# Patient Record
Sex: Male | Born: 1950 | Race: White | Hispanic: No | Marital: Married | Smoking: Light tobacco smoker
Health system: Southern US, Community
[De-identification: ages and names within clinical notes are randomized; demographics above are authoritative.]

## PROBLEM LIST (undated history)

## (undated) DIAGNOSIS — M19072 Primary osteoarthritis, left ankle and foot: Secondary | ICD-10-CM

## (undated) DIAGNOSIS — D801 Nonfamilial hypogammaglobulinemia: Secondary | ICD-10-CM

## (undated) DIAGNOSIS — F329 Major depressive disorder, single episode, unspecified: Secondary | ICD-10-CM

## (undated) DIAGNOSIS — I10 Essential (primary) hypertension: Secondary | ICD-10-CM

## (undated) DIAGNOSIS — E785 Hyperlipidemia, unspecified: Secondary | ICD-10-CM

## (undated) DIAGNOSIS — F32A Depression, unspecified: Secondary | ICD-10-CM

## (undated) DIAGNOSIS — N138 Other obstructive and reflux uropathy: Secondary | ICD-10-CM

## (undated) DIAGNOSIS — N401 Enlarged prostate with lower urinary tract symptoms: Secondary | ICD-10-CM

## (undated) DIAGNOSIS — M48062 Spinal stenosis, lumbar region with neurogenic claudication: Secondary | ICD-10-CM

## (undated) DIAGNOSIS — E039 Hypothyroidism, unspecified: Secondary | ICD-10-CM

## (undated) DIAGNOSIS — K289 Gastrojejunal ulcer, unspecified as acute or chronic, without hemorrhage or perforation: Secondary | ICD-10-CM

## (undated) DIAGNOSIS — G4733 Obstructive sleep apnea (adult) (pediatric): Secondary | ICD-10-CM

## (undated) HISTORY — DX: Other obstructive and reflux uropathy: N40.1

## (undated) HISTORY — DX: Primary osteoarthritis, left ankle and foot: M19.072

## (undated) HISTORY — PX: HERNIA REPAIR: SHX51

## (undated) HISTORY — DX: Hypothyroidism, unspecified: E03.9

## (undated) HISTORY — PX: APPENDECTOMY: SHX54

## (undated) HISTORY — PX: FOOT SURGERY: SHX648

## (undated) HISTORY — PX: THYROIDECTOMY, PARTIAL: SHX18

## (undated) HISTORY — DX: Essential (primary) hypertension: I10

## (undated) HISTORY — PX: VASECTOMY: SHX75

## (undated) HISTORY — DX: Hyperlipidemia, unspecified: E78.5

## (undated) HISTORY — PX: BACK SURGERY: SHX140

## (undated) HISTORY — DX: Major depressive disorder, single episode, unspecified: F32.9

## (undated) HISTORY — DX: Depression, unspecified: F32.A

## (undated) HISTORY — DX: Spinal stenosis, lumbar region with neurogenic claudication: M48.062

## (undated) HISTORY — DX: Obstructive sleep apnea (adult) (pediatric): G47.33

## (undated) HISTORY — DX: Other obstructive and reflux uropathy: N13.8

## (undated) HISTORY — DX: Nonfamilial hypogammaglobulinemia: D80.1

---

## 2004-06-03 ENCOUNTER — Ambulatory Visit: Payer: Self-pay | Admitting: Surgery

## 2007-12-04 ENCOUNTER — Ambulatory Visit: Payer: Self-pay | Admitting: Family Medicine

## 2012-12-01 DIAGNOSIS — R39198 Other difficulties with micturition: Secondary | ICD-10-CM | POA: Insufficient documentation

## 2012-12-01 DIAGNOSIS — N138 Other obstructive and reflux uropathy: Secondary | ICD-10-CM | POA: Insufficient documentation

## 2012-12-01 DIAGNOSIS — R351 Nocturia: Secondary | ICD-10-CM | POA: Insufficient documentation

## 2013-10-21 ENCOUNTER — Ambulatory Visit: Payer: Self-pay | Admitting: Gastroenterology

## 2013-10-24 LAB — PATHOLOGY REPORT

## 2014-03-24 DIAGNOSIS — E785 Hyperlipidemia, unspecified: Secondary | ICD-10-CM | POA: Insufficient documentation

## 2014-03-24 DIAGNOSIS — E890 Postprocedural hypothyroidism: Secondary | ICD-10-CM | POA: Insufficient documentation

## 2014-03-24 DIAGNOSIS — I10 Essential (primary) hypertension: Secondary | ICD-10-CM | POA: Insufficient documentation

## 2015-10-11 DIAGNOSIS — M19072 Primary osteoarthritis, left ankle and foot: Secondary | ICD-10-CM | POA: Insufficient documentation

## 2015-10-11 DIAGNOSIS — F3340 Major depressive disorder, recurrent, in remission, unspecified: Secondary | ICD-10-CM | POA: Insufficient documentation

## 2016-02-17 DIAGNOSIS — R339 Retention of urine, unspecified: Secondary | ICD-10-CM | POA: Insufficient documentation

## 2016-09-24 DIAGNOSIS — M48062 Spinal stenosis, lumbar region with neurogenic claudication: Secondary | ICD-10-CM | POA: Insufficient documentation

## 2017-06-08 DIAGNOSIS — E669 Obesity, unspecified: Secondary | ICD-10-CM | POA: Insufficient documentation

## 2018-05-19 ENCOUNTER — Encounter: Payer: Self-pay | Admitting: Podiatry

## 2018-05-19 ENCOUNTER — Ambulatory Visit (INDEPENDENT_AMBULATORY_CARE_PROVIDER_SITE_OTHER): Payer: Medicare Other | Admitting: Podiatry

## 2018-05-19 VITALS — BP 137/63 | HR 68 | Resp 16

## 2018-05-19 DIAGNOSIS — F329 Major depressive disorder, single episode, unspecified: Secondary | ICD-10-CM | POA: Insufficient documentation

## 2018-05-19 DIAGNOSIS — L603 Nail dystrophy: Secondary | ICD-10-CM

## 2018-05-19 DIAGNOSIS — E785 Hyperlipidemia, unspecified: Secondary | ICD-10-CM | POA: Insufficient documentation

## 2018-05-19 DIAGNOSIS — F32A Depression, unspecified: Secondary | ICD-10-CM | POA: Insufficient documentation

## 2018-05-19 DIAGNOSIS — M542 Cervicalgia: Secondary | ICD-10-CM | POA: Insufficient documentation

## 2018-05-19 DIAGNOSIS — E039 Hypothyroidism, unspecified: Secondary | ICD-10-CM | POA: Insufficient documentation

## 2018-05-19 NOTE — Progress Notes (Signed)
  Subjective:  Patient ID: Donald Lee, male    DOB: 13-Jan-1951,  MRN: [id number] HPI Chief Complaint  Patient presents with  . Nail Problem    Toenails bilateral - thick and discolored x years, doesn't think he can take oral meds due to other meds he's on, questions re: laser, check hallux right - lateral border - tender  . New Patient (Initial Visit)    68 y.o. male presents with the above complaint.   ROS: He denies fever chills nausea vomiting muscle aches pains calf pain back pain chest pain shortness of breath.  No past medical history on file.   Current Outpatient Medications:  .  albuterol (VENTOLIN HFA) 108 (90 Base) MCG/ACT inhaler, Ventolin HFA 90 mcg/actuation aerosol inhaler  INL 2 INHALATIONS ITL Q 4 H PRF WHZ OR SOB, Disp: , Rfl:  .  alfuzosin (UROXATRAL) 10 MG 24 hr tablet, , Disp: , Rfl:  .  atorvastatin (LIPITOR) 20 MG tablet, , Disp: , Rfl:  .  finasteride (PROSCAR) 5 MG tablet, , Disp: , Rfl:  .  levothyroxine (SYNTHROID, LEVOTHROID) 125 MCG tablet, , Disp: , Rfl:  .  losartan-hydrochlorothiazide (HYZAAR) 100-12.5 MG tablet, , Disp: , Rfl:   No Known Allergies Review of Systems Objective:   Vitals:   05/19/18 1037  BP: 137/63  Pulse: 68  Resp: 16    General: Well developed, nourished, in no acute distress, alert and oriented x3   Dermatological: Skin is warm, dry and supple bilateral. Nails x 10 are well maintained; remaining integument appears unremarkable at this time. There are no open sores, no preulcerative lesions, no rash or signs of infection present.  Toenails are thick discolored with some subungual bleeding.  They are long and painful.  Skin does demonstrate a mild tinea pedis.  Vascular: Dorsalis Pedis artery and Posterior Tibial artery pedal pulses are 2/4 bilateral with immedate capillary fill time. Pedal hair growth present. No varicosities and no lower extremity edema present bilateral.   Neruologic: Grossly intact via light touch  bilateral. Vibratory intact via tuning fork bilateral. Protective threshold with Semmes Wienstein monofilament intact to all pedal sites bilateral. Patellar and Achilles deep tendon reflexes 2+ bilateral. No Babinski or clonus noted bilateral.   Musculoskeletal: No gross boney pedal deformities bilateral. No pain, crepitus, or limitation noted with foot and ankle range of motion bilateral. Muscular strength 5/5 in all groups tested bilateral.  Gait: Unassisted, Nonantalgic.    Radiographs:  None taken  Assessment & Plan:   Assessment: Onychomycosis and tinea pedis  Plan: Took samples of the skin and nail to be sent for pathologic evaluation.  This patient does not want to take oral medication unless absolutely necessary he is very interested in laser therapy.     Cayce Quezada T. [place], [place]

## 2018-06-28 ENCOUNTER — Other Ambulatory Visit: Payer: Self-pay

## 2018-06-28 ENCOUNTER — Encounter: Payer: Self-pay | Admitting: Podiatry

## 2018-06-28 ENCOUNTER — Ambulatory Visit (INDEPENDENT_AMBULATORY_CARE_PROVIDER_SITE_OTHER): Payer: Medicare Other | Admitting: Podiatry

## 2018-06-28 DIAGNOSIS — Z79899 Other long term (current) drug therapy: Secondary | ICD-10-CM | POA: Diagnosis not present

## 2018-06-28 DIAGNOSIS — L603 Nail dystrophy: Secondary | ICD-10-CM

## 2018-06-28 MED ORDER — TERBINAFINE HCL 250 MG PO TABS: 250.0000 mg | ORAL_TABLET | Freq: Every day | ORAL | 0 refills | Status: DC

## 2018-06-28 NOTE — Progress Notes (Signed)
He presents today for follow-up of his nail dystrophy just to discuss his pathology results.  Objective: Vital signs are stable alert oriented x3 pathology report today demonstrates positive for onychomycosis.  Assessment: Onychomycosis.  Plan: Discussed etiology pathology and surgical therapies at this point he would like to start on oral therapy.  I discussed laser therapy and topical therapy with him however he chose oral therapy we discussed the pros and cons of the use of the medication.  At this point we will go ahead and get him started on the Lamisil 250 mg tablets 1 p.o. daily and request liver profile.  Should blood work come back abnormal we will notify him immediately.

## 2018-06-29 LAB — HEPATIC FUNCTION PANEL
ALK PHOS: 51 IU/L (ref 39–117)
ALT: 27 IU/L (ref 0–44)
AST: 23 IU/L (ref 0–40)
Albumin: 5 g/dL — ABNORMAL HIGH (ref 3.8–4.8)
Bilirubin Total: 0.4 mg/dL (ref 0.0–1.2)
Bilirubin, Direct: 0.1 mg/dL (ref 0.00–0.40)
Total Protein: 6.9 g/dL (ref 6.0–8.5)

## 2018-07-02 ENCOUNTER — Telehealth: Payer: Self-pay | Admitting: *Deleted

## 2018-07-02 NOTE — Telephone Encounter (Signed)
Phone rang over 1 minute without answering service message.

## 2018-07-02 NOTE — Telephone Encounter (Signed)
-----   Message from Elinor Parkinson, [place] sent at 06/29/2018  7:08 AM EDT ----- Blood work looks good and may continue medication.

## 2018-07-05 NOTE — Telephone Encounter (Signed)
Left message informing pt of Dr. Hyatt's review of results and orders. 

## 2018-07-22 ENCOUNTER — Other Ambulatory Visit: Payer: Self-pay | Admitting: Podiatry

## 2018-08-03 ENCOUNTER — Telehealth: Payer: Self-pay | Admitting: *Deleted

## 2018-08-03 NOTE — Telephone Encounter (Signed)
Refill request for terbinafine.

## 2018-08-03 NOTE — Telephone Encounter (Signed)
Patient is seeing Dr. Al Corpus tomorrow for Lamisil follow up.  We will wait to refill until after seen.

## 2018-08-04 ENCOUNTER — Ambulatory Visit (INDEPENDENT_AMBULATORY_CARE_PROVIDER_SITE_OTHER): Payer: Medicare Other | Admitting: Podiatry

## 2018-08-04 ENCOUNTER — Ambulatory Visit: Payer: Medicare Other | Admitting: Podiatry

## 2018-08-04 ENCOUNTER — Encounter: Payer: Self-pay | Admitting: Podiatry

## 2018-08-04 ENCOUNTER — Other Ambulatory Visit: Payer: Self-pay

## 2018-08-04 VITALS — Temp 97.4°F

## 2018-08-04 DIAGNOSIS — Z79899 Other long term (current) drug therapy: Secondary | ICD-10-CM | POA: Diagnosis not present

## 2018-08-04 DIAGNOSIS — L603 Nail dystrophy: Secondary | ICD-10-CM | POA: Diagnosis not present

## 2018-08-04 MED ORDER — TERBINAFINE HCL 250 MG PO TABS: 250.0000 mg | ORAL_TABLET | Freq: Every day | ORAL | 0 refills | Status: DC

## 2018-08-04 NOTE — Progress Notes (Signed)
He presents today for follow-up of his treatment for nail fungus.  He continues to take his Lamisil on a daily basis has no problem taking it.  Denies fever chills nausea vomiting muscle aches pains.  Objective: No change in physical exam.  Assessment: Onychomycosis.  Plan: Continue therapy Lamisil 250 mg tablets 1 p.o. daily we also requested a liver profile I will follow-up with him should this liver profile be abnormal otherwise I will see him in 4 months

## 2018-08-06 LAB — HEPATIC FUNCTION PANEL
ALT: 22 IU/L (ref 0–44)
AST: 21 IU/L (ref 0–40)
Albumin: 4.5 g/dL (ref 3.8–4.8)
Alkaline Phosphatase: 47 IU/L (ref 39–117)
Bilirubin Total: 0.3 mg/dL (ref 0.0–1.2)
Bilirubin, Direct: 0.07 mg/dL (ref 0.00–0.40)
Total Protein: 6.7 g/dL (ref 6.0–8.5)

## 2018-10-12 ENCOUNTER — Other Ambulatory Visit: Payer: Self-pay | Admitting: Podiatry

## 2018-11-24 ENCOUNTER — Ambulatory Visit: Payer: Medicare Other | Admitting: Podiatry

## 2018-11-24 ENCOUNTER — Ambulatory Visit (INDEPENDENT_AMBULATORY_CARE_PROVIDER_SITE_OTHER): Payer: Medicare Other | Admitting: Podiatry

## 2018-11-24 ENCOUNTER — Other Ambulatory Visit: Payer: Self-pay

## 2018-11-24 ENCOUNTER — Encounter: Payer: Self-pay | Admitting: Podiatry

## 2018-11-24 VITALS — Temp 98.2°F

## 2018-11-24 DIAGNOSIS — L603 Nail dystrophy: Secondary | ICD-10-CM | POA: Diagnosis not present

## 2018-11-24 MED ORDER — TERBINAFINE HCL 250 MG PO TABS: 250.0000 mg | ORAL_TABLET | Freq: Every day | ORAL | 0 refills | Status: DC

## 2018-11-24 NOTE — Progress Notes (Signed)
He presents today for follow-up of his Lamisil therapy.  States that he is doing very well he is very happy with the outcome of the therapy thus far.  States that it is more to grow out.  States that he will be getting married on the 20th and he is glad that his toenails look better.  Objective: Vital signs are stable he is alert and oriented x3.  Pulses are palpable.  There is no erythema edema cellulitis drainage odor toenails are growing out by approximately 50%.  Assessment: Resolving onychomycosis with long-term therapy of Lamisil.  Plan: He will take 1 tablet every other day for the next 2 months I will follow-up with him in 3 months.

## 2019-03-02 ENCOUNTER — Other Ambulatory Visit: Payer: Self-pay

## 2019-03-02 ENCOUNTER — Encounter: Payer: Self-pay | Admitting: Podiatry

## 2019-03-02 ENCOUNTER — Ambulatory Visit (INDEPENDENT_AMBULATORY_CARE_PROVIDER_SITE_OTHER): Payer: Medicare Other | Admitting: Podiatry

## 2019-03-02 DIAGNOSIS — L600 Ingrowing nail: Secondary | ICD-10-CM | POA: Diagnosis not present

## 2019-03-02 DIAGNOSIS — L603 Nail dystrophy: Secondary | ICD-10-CM

## 2019-03-02 MED ORDER — TERBINAFINE HCL 250 MG PO TABS: 250.0000 mg | ORAL_TABLET | Freq: Every day | ORAL | 0 refills | Status: DC

## 2019-03-02 MED ORDER — NEOMYCIN-POLYMYXIN-HC 1 % OT SOLN: OTIC | 1 refills | Status: DC

## 2019-03-02 NOTE — Patient Instructions (Signed)

## 2019-03-02 NOTE — Progress Notes (Signed)
And presents today for follow-up of his onychomycosis.  He states that is growing out very nicely and very happy with the outcome.  I think I need a few more meds and then it should be done.  However I have an ingrown today which is really bother me as he refers to the hallux right with both borders.  Been bothering for quite some time I can get rid of it.  Objective: Vital signs are stable alert oriented x3.  There is no erythema edema cellulitis drainage or odor to all of the toes with exception of the hallux right with a sharp incurvated nail margin pain on palpation.  No purulence no malodor is noted.  Nail plates appear to be 65% resolved.  Assessment: Well-healing onychomycosis.  Ingrown toenail hallux right.  Plan: Continue the use of every other day Lamisil 250 mg tablets and I will follow-up with him in 3 months for this.  Questions or concerns she will notify us.  Otherwise chemical matrixectomy was performed today after local anesthetic was provided.  He tolerated the procedure well with no complications.  He was provided both oral and written home-going instruction for the care and soaking of the toe as well as a prescription for Corticosporin otic to be applied twice daily after soaking.  Follow-up with him in 2 weeks for this reevaluation.

## 2019-03-16 ENCOUNTER — Ambulatory Visit (INDEPENDENT_AMBULATORY_CARE_PROVIDER_SITE_OTHER): Payer: Self-pay | Admitting: Podiatry

## 2019-03-16 ENCOUNTER — Other Ambulatory Visit: Payer: Self-pay

## 2019-03-16 DIAGNOSIS — L600 Ingrowing nail: Secondary | ICD-10-CM

## 2019-03-16 NOTE — Progress Notes (Signed)
He presents today for follow-up of his matrixectomy hallux right.  States that he does have some minor pain and redness but it seems to be getting much better.  He did not hit the Corticosporin otic field because of the expense.  But he continues to take his Lamisil.  Objective: Vital signs are stable he is alert and oriented x3 there is no erythema edema cellulitis drainage odor appears to be healing very nicely.  Tip of the toe is mildly tender.  If this continues to be is tender we may need to do reassess it and make sure that there is nothing there such as a foreign body or deep abscess.  Assessment: Well-healing surgical foot possible abscess.  Plan: Continue to soak Epson salts and warm water follow-up with me in 2 weeks if not improved.

## 2019-04-04 ENCOUNTER — Ambulatory Visit: Payer: Medicare Other | Admitting: Podiatry

## 2019-05-03 ENCOUNTER — Other Ambulatory Visit: Payer: Self-pay | Admitting: Podiatry

## 2019-05-11 DIAGNOSIS — G2581 Restless legs syndrome: Secondary | ICD-10-CM | POA: Insufficient documentation

## 2019-06-01 ENCOUNTER — Ambulatory Visit: Payer: Medicare Other | Admitting: Podiatry

## 2019-07-12 ENCOUNTER — Other Ambulatory Visit: Payer: Self-pay | Admitting: Physician Assistant

## 2019-07-12 DIAGNOSIS — M25519 Pain in unspecified shoulder: Secondary | ICD-10-CM

## 2019-07-12 DIAGNOSIS — M5412 Radiculopathy, cervical region: Secondary | ICD-10-CM

## 2019-07-12 DIAGNOSIS — M542 Cervicalgia: Secondary | ICD-10-CM

## 2019-07-28 ENCOUNTER — Other Ambulatory Visit: Payer: Self-pay

## 2019-07-28 ENCOUNTER — Ambulatory Visit
Admission: RE | Admit: 2019-07-28 | Discharge: 2019-07-28 | Disposition: A | Discharge status: Home / Self Care | Payer: Medicare Other | Source: Ambulatory Visit | Attending: Physician Assistant | Admitting: Physician Assistant

## 2019-07-28 DIAGNOSIS — M542 Cervicalgia: Secondary | ICD-10-CM | POA: Insufficient documentation

## 2019-07-28 DIAGNOSIS — M25519 Pain in unspecified shoulder: Secondary | ICD-10-CM | POA: Diagnosis present

## 2019-07-28 DIAGNOSIS — M5412 Radiculopathy, cervical region: Secondary | ICD-10-CM | POA: Insufficient documentation

## 2019-07-28 IMAGING — MR MR CERVICAL SPINE W/O CM
5 series · 38 of 48 positions shown · non-contrast
Comparison: None.

CLINICAL DATA: Radiculitis of right cervical region. Neck and
shoulder pain.

EXAM:
MRI CERVICAL SPINE WITHOUT CONTRAST
TECHNIQUE: Multiplanar, multisequence MR imaging of the cervical spine was
performed. No intravenous contrast was administered.

[Series 5: T2 · sagittal · 3.0mm · 0.62mm/px · 6 of 15 slices shown (1 of 2)]
[im 1/15]
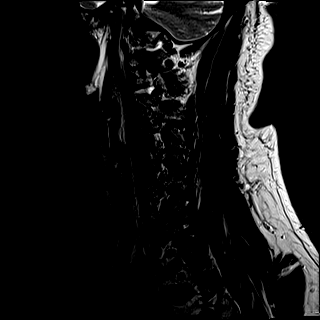
[im 3/15]
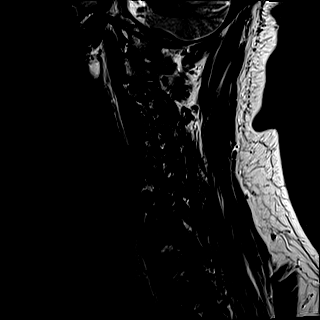
[im 6/15]
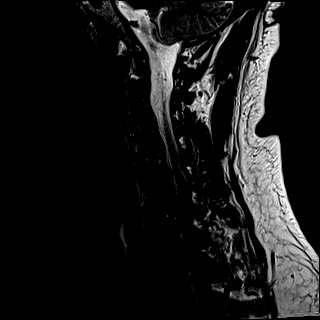
[im 9/15]
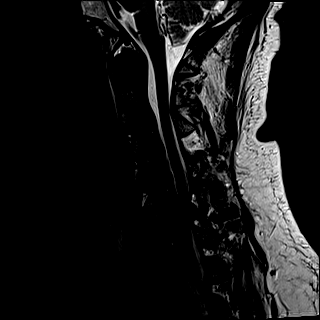
[im 12/15]
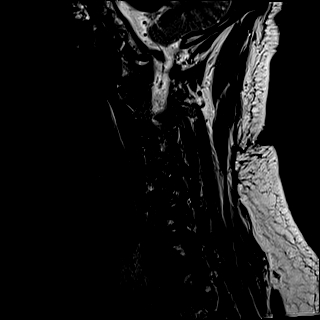
[im 15/15]
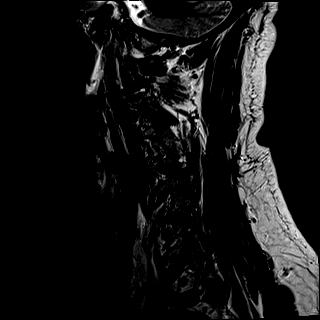

[Series 6: FLAIR · sagittal · 3.0mm · 0.78mm/px · 7 of 15 slices shown]
[im 1/15]
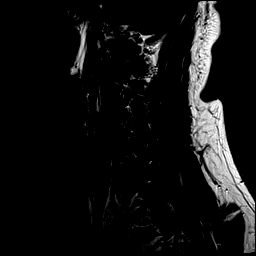
[im 3/15]
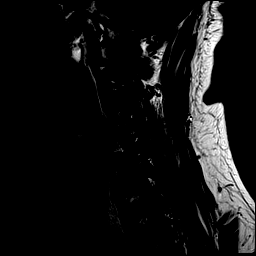
[im 5/15]
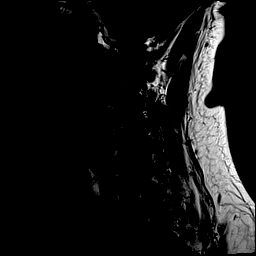
[im 8/15]
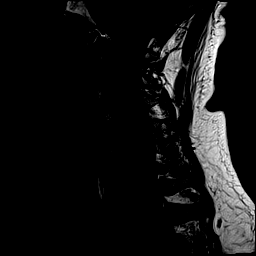
[im 10/15]
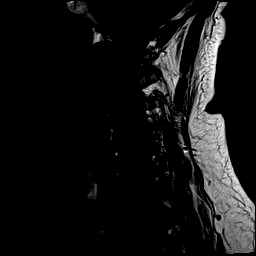
[im 12/15]
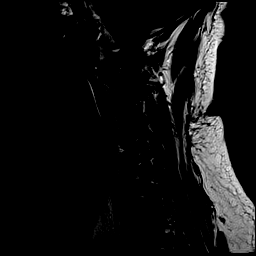
[im 15/15]
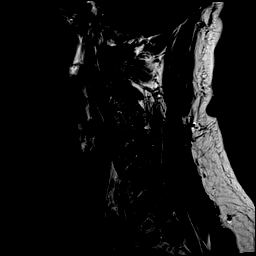

[Series 7: STIR · sagittal · 3.0mm · 0.62mm/px · 7 of 15 slices shown]
[im 1/15]
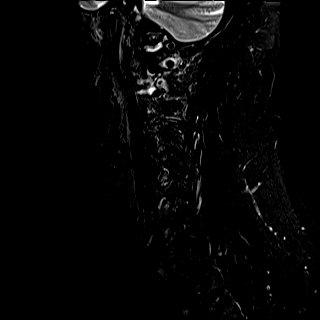
[im 3/15]
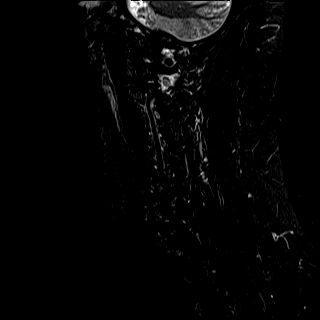
[im 5/15]
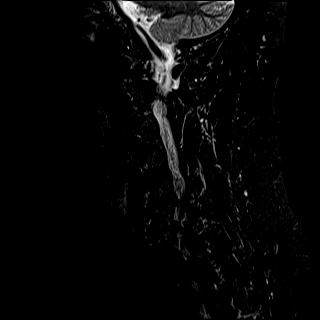
[im 8/15]
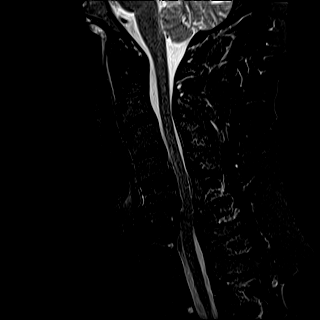
[im 10/15]
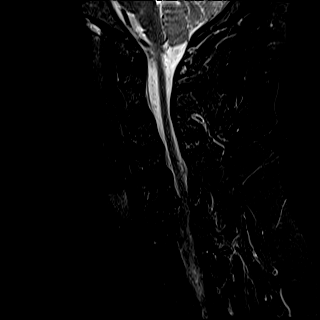
[im 12/15]
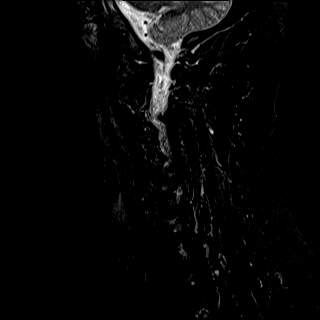
[im 15/15]
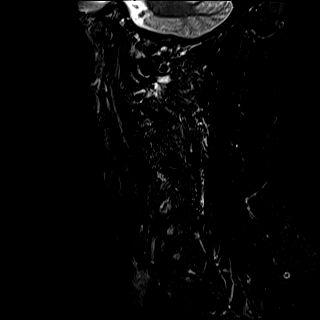

[Series 8: T2 · axial · 3.0mm · 0.70mm/px · z∈[-136,-32]mm · 10 of 31 slices shown (2 of 2)]
[im 1/31]
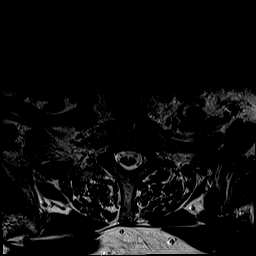
[im 3/31]
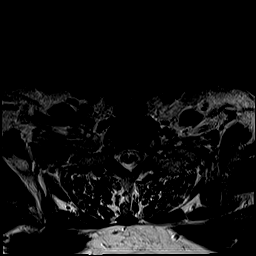
[im 5/31]
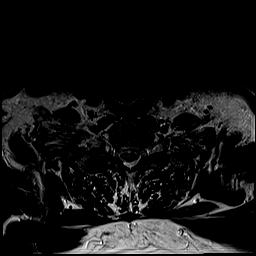
[im 7/31]
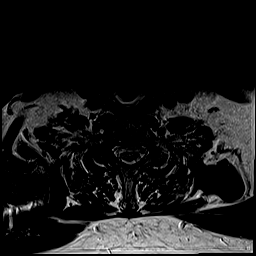
[im 10/31]
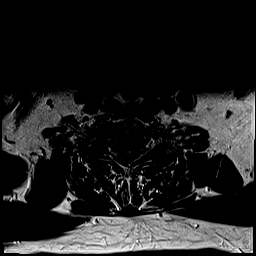
[im 14/31]
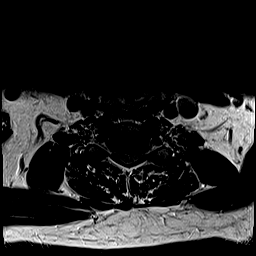
[im 17/31]
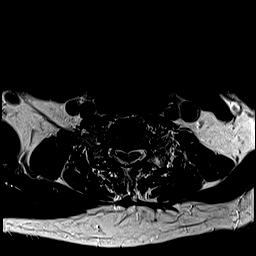
[im 21/31]
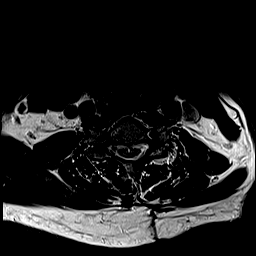
[im 26/31]
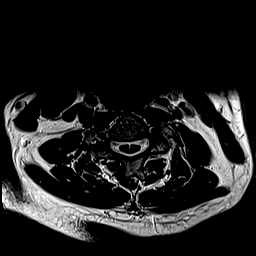
[im 31/31]
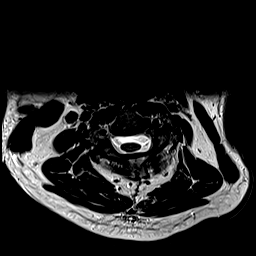

[Series 9: ax mpgr · axial · 3.0mm · 0.35mm/px · z∈[-136,-32]mm · 8 of 31 slices shown]
[im 1/31]
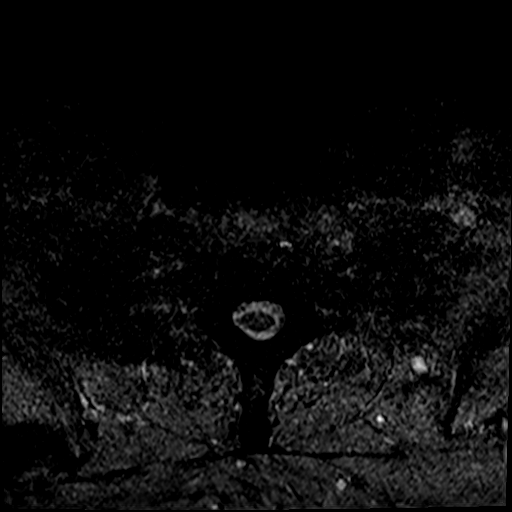
[im 5/31]
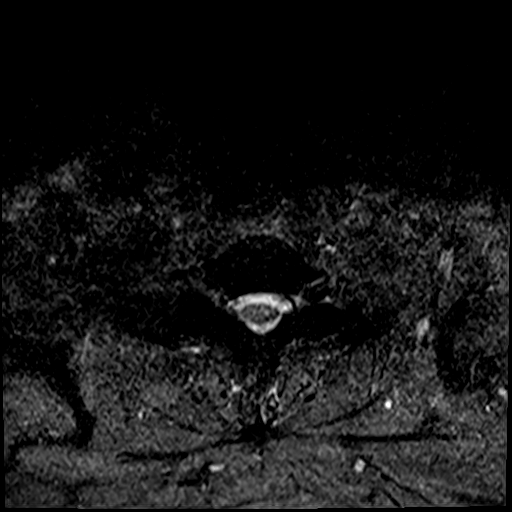
[im 10/31]
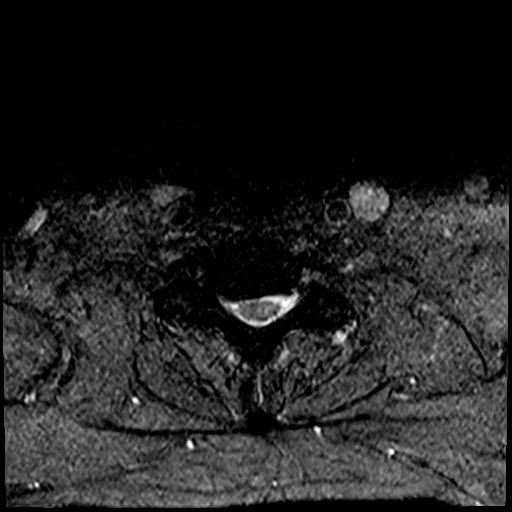
[im 14/31]
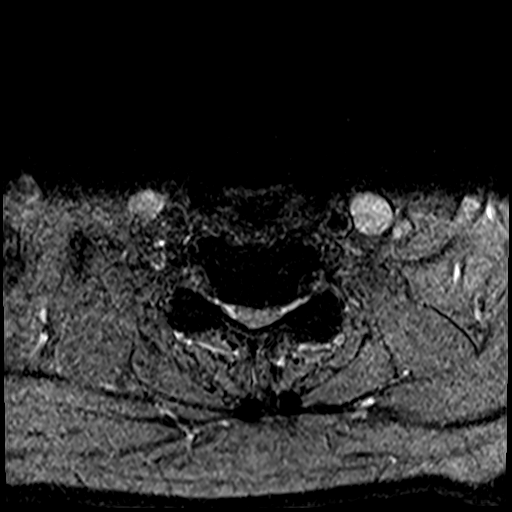
[im 17/31]
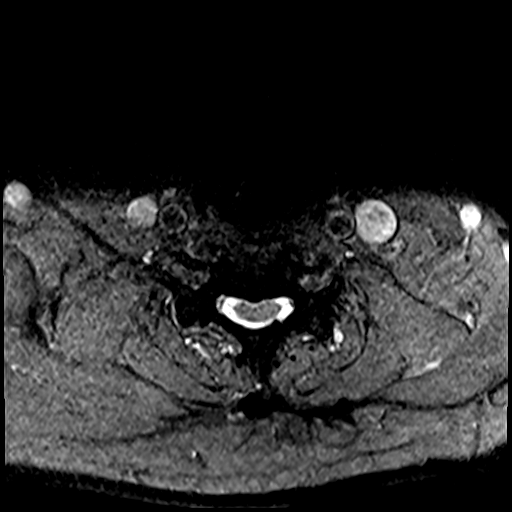
[im 21/31]
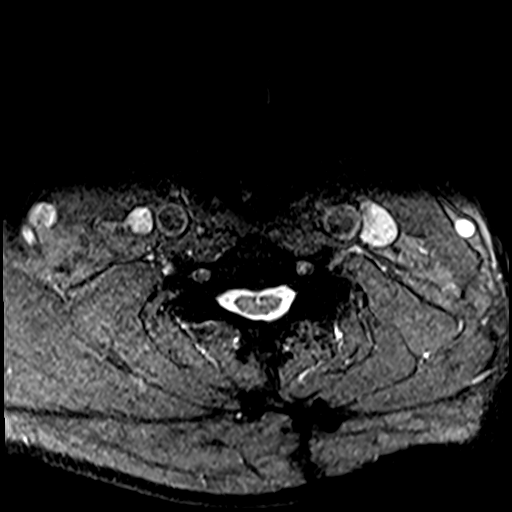
[im 26/31]
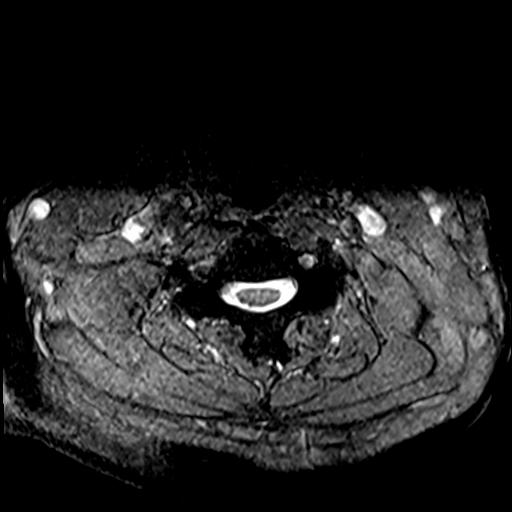
[im 31/31]
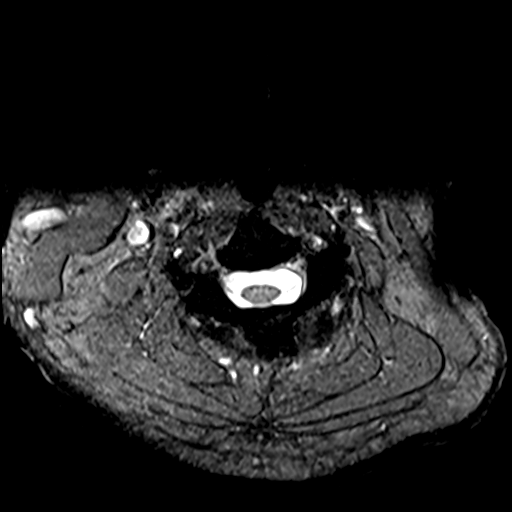

[38 of 48 positions shown; findings below may reference images not displayed]

FINDINGS: Alignment: Mild reversal of the cervical curvature.

Vertebrae: No fracture, evidence of discitis, or bone lesion.
Endplate degenerative changes at C5-6.

Cord: Flattening of the anterior aspect of the cord at C5-6, without
cord signal abnormality.

Posterior Fossa, vertebral arteries, paraspinal tissues: Negative.

Disc levels:

C2-3: No spinal canal stenosis. Facet degenerative changes, more
pronounced on the left side resulting in mild left neural foraminal
stenosis.

C3-4: No spinal canal stenosis. Facet degenerative changes, more
pronounced on the left side, resulting in mild left neural foraminal
narrowing.

C4-5: Posterior disc protrusion resulting in mild spinal canal
stenosis. Uncovertebral and facet degenerative changes, more
pronounced at the level of the left facet joint resulting in mild
right and severe left neural foraminal narrowing.

C5-6: Posterior disc osteophyte complex causing flattening of the
anterior cord and moderate spinal canal stenosis. Uncovertebral and
facet degenerative changes resulting in severe bilateral neural
foraminal narrowing.

C6-7: Posterior disc protrusion resulting in mild spinal canal
stenosis. Uncovertebral and facet degenerative changes, more
pronounced at the level of the right uncovertebral joint, resulting
in severe right and mild left neural foraminal narrowing.

C7-T1: Facet degenerative changes. No spinal canal or neural
foraminal stenosis.
IMPRESSION: 1. Multilevel degenerative changes of the cervical spine, as
described above, worst at C5-6 where there is moderate spinal canal
stenosis with flattening of the anterior aspect of the cord. No
abnormal cord signal.
2. Multilevel neural foraminal stenosis, severe on the left at C4-5,
bilaterally at C5-6 and on the right at C6-7.

## 2019-08-15 DIAGNOSIS — M4802 Spinal stenosis, cervical region: Secondary | ICD-10-CM | POA: Insufficient documentation

## 2020-01-05 ENCOUNTER — Other Ambulatory Visit: Payer: Self-pay | Admitting: Physical Medicine & Rehabilitation

## 2020-01-05 DIAGNOSIS — M5416 Radiculopathy, lumbar region: Secondary | ICD-10-CM

## 2020-01-05 DIAGNOSIS — G8929 Other chronic pain: Secondary | ICD-10-CM | POA: Insufficient documentation

## 2020-01-23 ENCOUNTER — Ambulatory Visit
Admission: RE | Admit: 2020-01-23 | Discharge: 2020-01-23 | Disposition: A | Discharge status: Home / Self Care | Payer: Medicare Other | Source: Ambulatory Visit | Attending: Physical Medicine & Rehabilitation | Admitting: Physical Medicine & Rehabilitation

## 2020-01-23 ENCOUNTER — Other Ambulatory Visit: Payer: Self-pay

## 2020-01-23 DIAGNOSIS — M5416 Radiculopathy, lumbar region: Secondary | ICD-10-CM | POA: Diagnosis present

## 2020-01-23 IMAGING — MR MR LUMBAR SPINE WO/W CM
6 of 7 series · 30 of 48 positions shown · IV contrast (10ml Gadavist)
Comparison: None.

CLINICAL DATA: Lumbar radiculopathy. Back pain and right leg pain.
Lumbar surgery 4 years ago.

EXAM:
MRI LUMBAR SPINE WITHOUT AND WITH CONTRAST
TECHNIQUE: Multiplanar and multiecho pulse sequences of the lumbar spine were
obtained without and with intravenous contrast.
CONTRAST:  10mL GADAVIST GADOBUTROL 1 MMOL/ML IV SOLN

[Series 5: T2 · sagittal · 4.0mm · 0.88mm/px · 4 of 17 slices shown (1 of 2)]
[im 1/17]
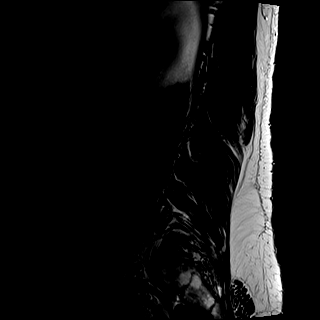
[im 6/17]
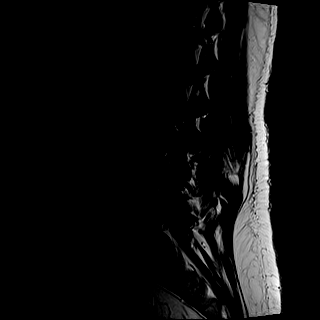
[im 11/17]
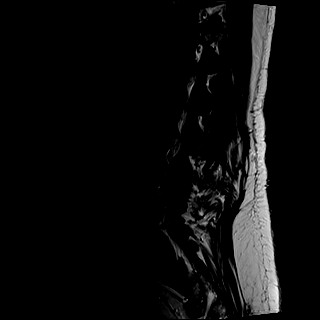
[im 17/17]
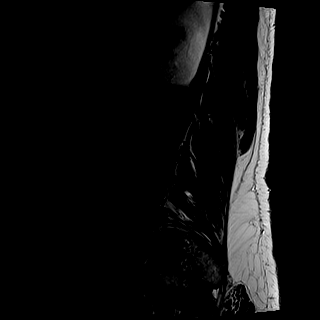

[Series 6: T1 · sagittal · 4.0mm · 0.88mm/px · 4 of 17 slices shown (1 of 2)]
[im 1/17]
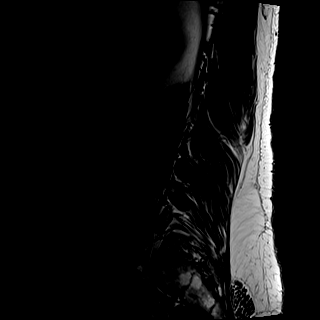
[im 6/17]
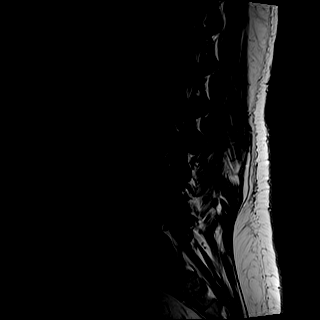
[im 11/17]
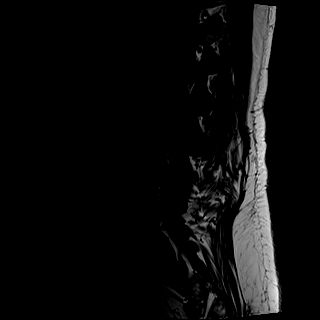
[im 17/17]
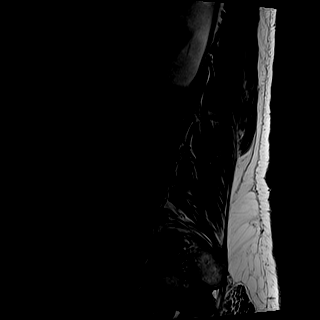

[Series 7: STIR · sagittal · 4.0mm · 0.44mm/px · 1 of 17 slices shown]
[im 1/17]
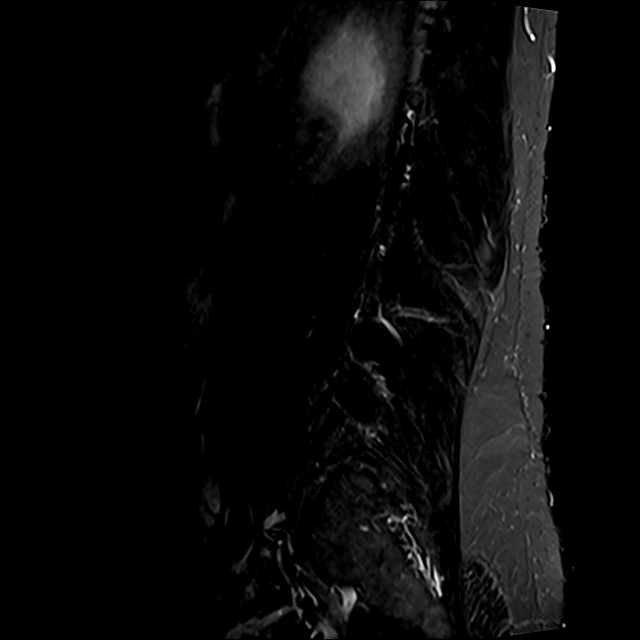

[Series 8: T2 · axial · 4.0mm · 0.78mm/px · z∈[-116,+107]mm · 8 of 36 slices shown (2 of 2)]
[im 1/36]
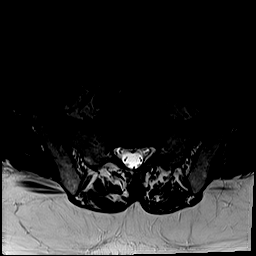
[im 4/36]
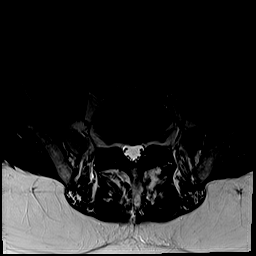
[im 12/36]
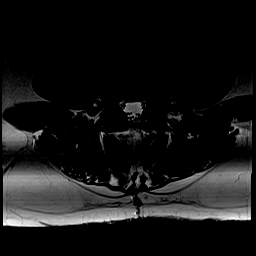
[im 16/36]
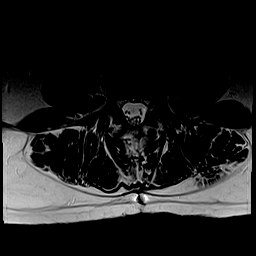
[im 20/36]
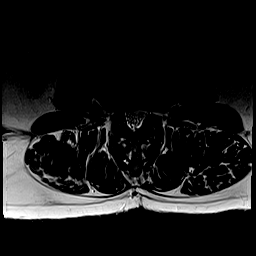
[im 24/36]
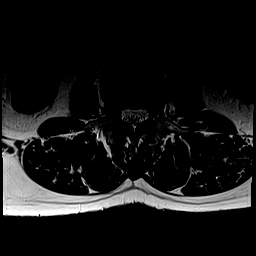
[im 32/36]
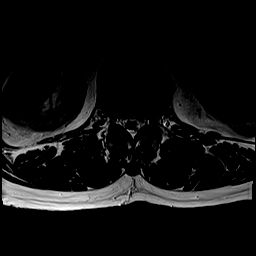
[im 36/36]
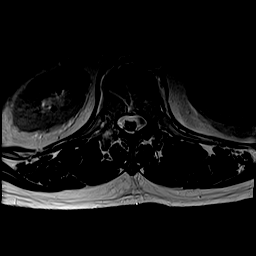

[Series 9: T1 · axial · 4.0mm · 0.39mm/px · z∈[-116,+107]mm · 8 of 36 slices shown (2 of 2)]
[im 1/36]
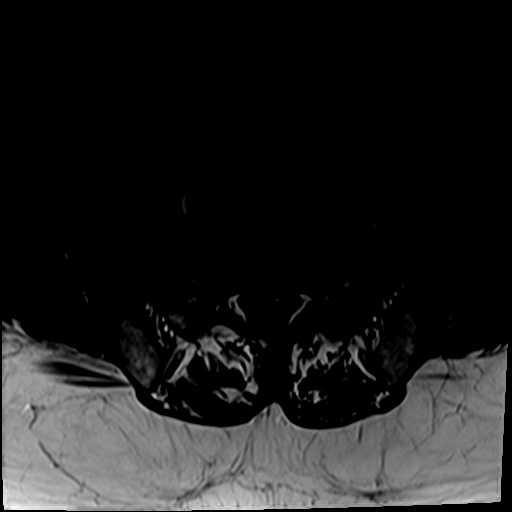
[im 4/36]
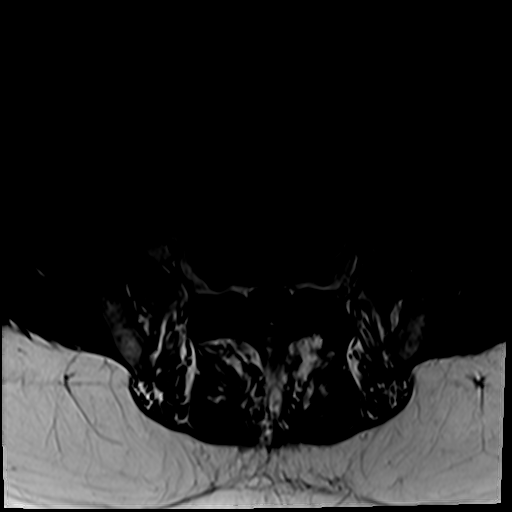
[im 12/36]
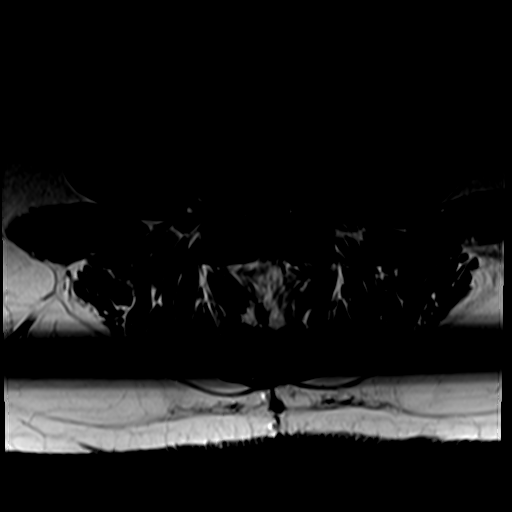
[im 16/36]
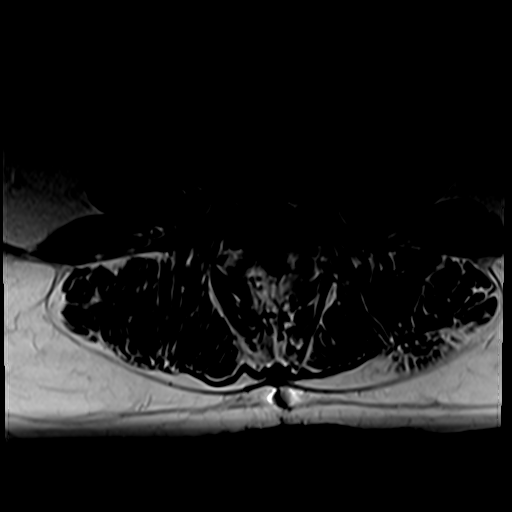
[im 20/36]
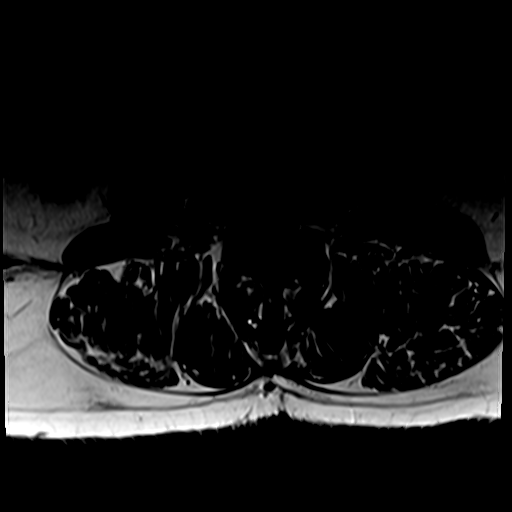
[im 24/36]
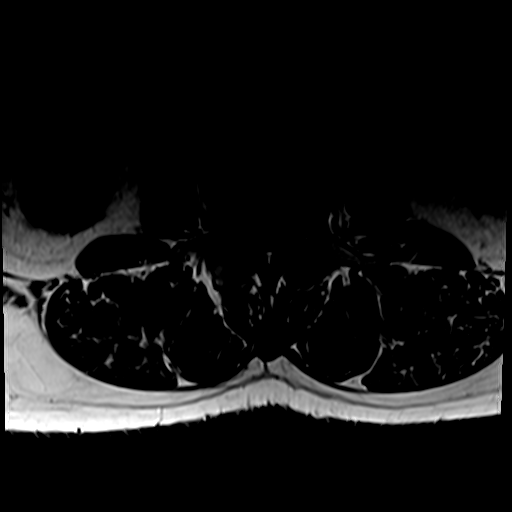
[im 32/36]
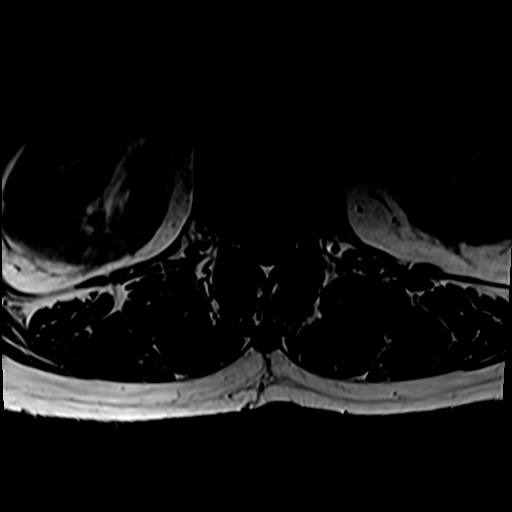
[im 36/36]
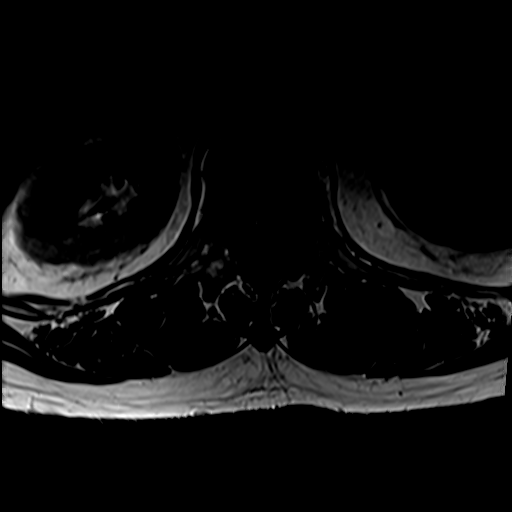

[Series 10: T1 fat-sat post-contrast · sagittal · 4.0mm · 0.88mm/px · 5 of 17 slices shown]
[im 1/17]
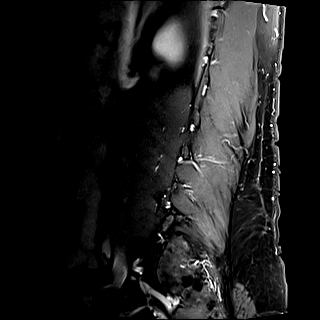
[im 5/17]
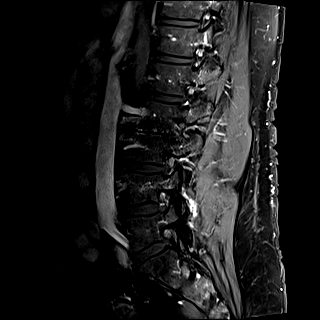
[im 9/17]
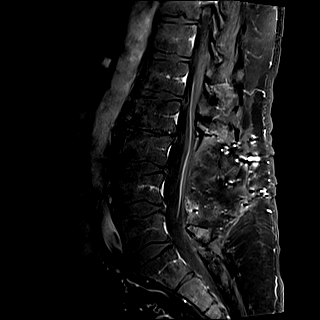
[im 13/17]
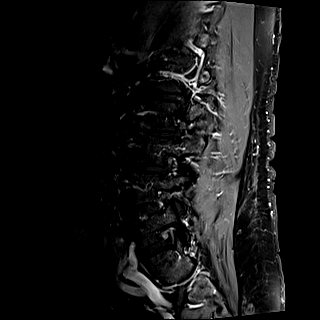
[im 17/17]
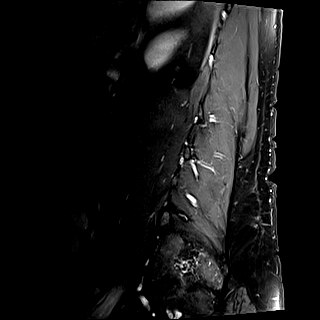

[30 of 48 positions shown; findings below may reference images not displayed]

FINDINGS: Segmentation:  Normal

Alignment:  Slight retrolisthesis L2-3.  Remaining alignment normal.

Vertebrae:  Normal bone marrow.  Negative for fracture or mass.

Conus medullaris and cauda equina: Conus extends to the L1-2 level.
Diffuse enhancement of the left S1 nerve root beginning at the conus
medullaris and extending to the neural foramen. No nodularity or
mass identified. Remaining nerve roots do not show abnormal
enhancement.

Paraspinal and other soft tissues: Negative for paraspinous mass or
adenopathy. No fluid collection in the soft tissues.

Disc levels:

L1-2: Mild disc degeneration and disc bulging. Mild facet
degeneration. Mild spinal stenosis. Shallow left foraminal disc
protrusion with mild left foraminal narrowing.

L2-3: Advanced disc degeneration with disc space narrowing and mild
bone marrow edema. Diffuse endplate spurring. Moderate subarticular
stenosis bilaterally. Laminectomy without spinal canal stenosis

L3-4: Diffuse disc bulging and small central disc protrusion. Mild
facet degeneration. Moderate subarticular stenosis bilaterally.
Posterior decompression. Spinal canal adequately decompressed.

L4-5: Disc degeneration with disc bulging and mild endplate spurring
and mild facet degeneration. Moderate subarticular and foraminal
stenosis on the right. Mild subarticular and foraminal stenosis on
the left. Posterior decompression. Spinal canal decompressed.

L5-S1: Normal disc space. Bilateral facet degeneration. No
significant spinal or foraminal stenosis.
IMPRESSION: Mild spinal stenosis L1-2. Shallow left foraminal disc protrusion
with mild left foraminal narrowing

Moderate subarticular stenosis bilaterally L2-3. Posterior
decompression

Moderate subarticular stenosis bilaterally L3-4. Posterior
decompression

Moderate subarticular foraminal stenosis on the right at L4-5 with
posterior decompression.

Diffuse enhancement of the left S1 nerve root without nodularity or
mass. This is most likely due to neuritis.

## 2020-01-23 MED ORDER — GADOBUTROL 1 MMOL/ML IV SOLN
10.0000 mL | Freq: Once | INTRAVENOUS | Status: AC | PRN
  Administered 2020-01-23: 10 mL via INTRAVENOUS

## 2020-04-16 ENCOUNTER — Ambulatory Visit: Payer: Medicare Other

## 2020-04-16 NOTE — Progress Notes (Unsigned)
Patient did not show up for visit today, office staff to contact patient and reschedule. 

## 2020-04-17 ENCOUNTER — Ambulatory Visit (INDEPENDENT_AMBULATORY_CARE_PROVIDER_SITE_OTHER): Payer: Medicare Other | Admitting: Internal Medicine

## 2020-04-17 VITALS — Ht 71.0 in | Wt 240.0 lb

## 2020-04-17 DIAGNOSIS — Z9989 Dependence on other enabling machines and devices: Secondary | ICD-10-CM

## 2020-04-17 DIAGNOSIS — G4733 Obstructive sleep apnea (adult) (pediatric): Secondary | ICD-10-CM | POA: Insufficient documentation

## 2020-04-17 DIAGNOSIS — Z7189 Other specified counseling: Secondary | ICD-10-CM | POA: Diagnosis not present

## 2020-04-17 DIAGNOSIS — E669 Obesity, unspecified: Secondary | ICD-10-CM | POA: Diagnosis not present

## 2020-04-17 NOTE — Patient Instructions (Signed)

## 2020-04-17 NOTE — Progress Notes (Signed)
[facility] [place], [place] [address]  Pulmonary Sleep Medicine   Office Visit Note  Patient Name: Donald Lee DOB: 01-19-1951 MRN [id number]  I connected with  Donald Lee on 04/17/20 by a video enabled telemedicine application and verified that I am speaking with the correct person using two identifiers.   I discussed the limitations of evaluation and management by telemedicine. The patient expressed understanding and agreed to proceed.   Chief Complaint: Obstructive Sleep Apnea visit  Brief History:  Donald Lee is seen today for follow up The patient has a 1.5 year history of sleep apnea. Patient is using PAP nightly.  The patient feels more rested after sleeping with PAP.  The patient reports benefiting from PAP use. Reported sleepiness is  improved and the Epworth Sleepiness Score is 0 out of 24. The patient does not take naps. The patient complains of the following: dry mouth  The compliance download shows excellent compliance with an average use time of 7.8 hours. The AHI is 5.2  The patient reports a crawly sensation in his legs but he has neuropathy and it does not interfere with sleep.   ROS  General: (-) fever, (-) chills, (-) night sweat Nose and Sinuses: (-) nasal stuffiness or itchiness, (-) postnasal drip, (-) nosebleeds, (-) sinus trouble. Mouth and Throat: (-) sore throat, (-) hoarseness. Neck: (-) swollen glands, (-) enlarged thyroid, (-) neck pain. Respiratory: - cough, - shortness of breath, - wheezing. Neurologic: + numbness, + tingling. Psychiatric: - anxiety, + depression   Current Medication: Outpatient Encounter Medications as of 04/17/2020  Medication Sig  . albuterol (VENTOLIN HFA) 108 (90 Base) MCG/ACT inhaler Ventolin HFA 90 mcg/actuation aerosol inhaler  INL 2 INHALATIONS ITL Q 4 H PRF WHZ OR SOB  . alfuzosin (UROXATRAL) 10 MG 24 hr tablet   . atorvastatin (LIPITOR) 20 MG tablet   . buPROPion (WELLBUTRIN) 75 MG  tablet Take 75 mg by mouth 2 (two) times daily.  . finasteride (PROSCAR) 5 MG tablet   . gabapentin (NEURONTIN) 100 MG capsule   . levothyroxine (SYNTHROID, LEVOTHROID) 125 MCG tablet   . losartan-hydrochlorothiazide (HYZAAR) 100-12.5 MG tablet   . NEOMYCIN-POLYMYXIN-HYDROCORTISONE (CORTISPORIN) 1 % SOLN OTIC solution Apply 1-2 drops to toe BID after soaking  . terbinafine (LAMISIL) 250 MG tablet Take 1 tablet (250 mg total) by mouth daily.   No facility-administered encounter medications on file as of 04/17/2020.    Surgical History: No past surgical history on file.  Medical History: No past medical history on file.  Family History: Non contributory to the present illness  Social History: Social History   Socioeconomic History  . Marital status: Married    Spouse name: Not on file  . Number of children: Not on file  . Years of education: Not on file  . Highest education level: Not on file  Occupational History  . Not on file  Tobacco Use  . Smoking status: Never Smoker  . Smokeless tobacco: Never Used  Substance and Sexual Activity  . Alcohol use: Yes  . Drug use: Not on file  . Sexual activity: Not on file  Other Topics Concern  . Not on file  Social History Narrative  . Not on file   Social Determinants of Health   Financial Resource Strain: Not on file  Food Insecurity: Not on file  Transportation Needs: Not on file  Physical Activity: Not on file  Stress: Not on file  Social Connections: Not on file  Intimate Partner Violence: Not on file    Vital Signs: There were no vitals taken for this visit.  Examination: General Appearance: The patient is well-developed, well-nourished, and in no distress. Neck Circumference:  Skin: Gross inspection of skin unremarkable. Head: normocephalic, no gross deformities. Eyes: no gross deformities noted. ENT: ears appear grossly normal Neurologic: Alert and oriented. No involuntary movements.    EPWORTH SLEEPINESS  SCALE:  Scale:  (0)= no chance of dozing; (1)= slight chance of dozing; (2)= moderate chance of dozing; (3)= high chance of dozing  Chance  Situtation    Sitting and reading: 0    Watching TV: 0    Sitting Inactive in public: 0    As a passenger in car: 0      Lying down to rest: 0    Sitting and talking: 0    Sitting quielty after lunch: 0    In a car, stopped in traffic: 0   TOTAL SCORE:   0 out of 24    SLEEP STUDIES:  1. PSG 09/17/18 AHI 58 SpO17min 86%   CPAP COMPLIANCE DATA:  Date Range: 04/18/19-04/16/20  Average Daily Use: 7.8 hours  Median Use: 7.9  Compliance for > 4 Hours: 96%  AHI: 5.2 respiratory events per hour  Days Used: 360/365  Mask Leak: 13.3  95th Percentile Pressure: 14.6         LABS: No results found for this or any previous visit (from the past 2160 hour(s)).  Radiology: MR Lumbar Spine W Wo Contrast  Result Date: 01/24/2020 CLINICAL DATA:  Lumbar radiculopathy. Back pain and right leg pain. Lumbar surgery 4 years ago. EXAM: MRI LUMBAR SPINE WITHOUT AND WITH CONTRAST TECHNIQUE: Multiplanar and multiecho pulse sequences of the lumbar spine were obtained without and with intravenous contrast. CONTRAST:  42mL GADAVIST GADOBUTROL 1 MMOL/ML IV SOLN COMPARISON:  None. FINDINGS: Segmentation:  Normal Alignment:  Slight retrolisthesis L2-3.  Remaining alignment normal. Vertebrae:  Normal bone marrow.  Negative for fracture or mass. Conus medullaris and cauda equina: Conus extends to the L1-2 level. Diffuse enhancement of the left S1 nerve root beginning at the conus medullaris and extending to the neural foramen. No nodularity or mass identified. Remaining nerve roots do not show abnormal enhancement. Paraspinal and other soft tissues: Negative for paraspinous mass or adenopathy. No fluid collection in the soft tissues. Disc levels: L1-2: Mild disc degeneration and disc bulging. Mild facet degeneration. Mild spinal stenosis. Shallow left  foraminal disc protrusion with mild left foraminal narrowing. L2-3: Advanced disc degeneration with disc space narrowing and mild bone marrow edema. Diffuse endplate spurring. Moderate subarticular stenosis bilaterally. Laminectomy without spinal canal stenosis L3-4: Diffuse disc bulging and small central disc protrusion. Mild facet degeneration. Moderate subarticular stenosis bilaterally. Posterior decompression. Spinal canal adequately decompressed. L4-5: Disc degeneration with disc bulging and mild endplate spurring and mild facet degeneration. Moderate subarticular and foraminal stenosis on the right. Mild subarticular and foraminal stenosis on the left. Posterior decompression. Spinal canal decompressed. L5-S1: Normal disc space. Bilateral facet degeneration. No significant spinal or foraminal stenosis. IMPRESSION: Mild spinal stenosis L1-2. Shallow left foraminal disc protrusion with mild left foraminal narrowing Moderate subarticular stenosis bilaterally L2-3. Posterior decompression Moderate subarticular stenosis bilaterally L3-4. Posterior decompression Moderate subarticular foraminal stenosis on the right at L4-5 with posterior decompression. Diffuse enhancement of the left S1 nerve root without nodularity or mass. This is most likely due to neuritis. Electronically Signed   By: Marlan Palau M.D.   On: 01/24/2020 09:48  No results found.  No results found.    Assessment and Plan: Patient Active Problem List   Diagnosis Date Noted  . Depressive disorder 05/19/2018  . Dyslipidemia 05/19/2018  . Hypothyroidism 05/19/2018  . Neck pain 05/19/2018  . Obesity (BMI 30-39.9) 06/08/2017  . 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 Incomplete emptying of bladder 02/17/2016  . 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 Recurrent major depressive disorder, in remission (HCC) 10/11/2015  . Hyperlipidemia 03/24/2014  . Hypertension 03/24/2014  .  Postoperative hypothyroidism 03/24/2014  . Benign localized hyperplasia of prostate with urinary obstruction 12/01/2012  . Nocturia 12/01/2012  . Slowing of urinary stream 12/01/2012      The patient does tolerate PAP and reports significant benefit from PAP use. The patient was reminded how to clean his CPAP and advised to stop using the SoClean. The patient was also counselled on watching his diet. He has been exercising. The compliance is excellent. The AHI is slightly elevated at 5.2.  The max pressure will be increased to 18 today.   1. OSA- on CPAPcontinue excellent compliance.\ 2. Pt reports good compliance with CPAP therapy. Cleaning machine by hand, and changing filters and tubing as directed. Denies headaches, sinus issues, palpitations, or hemoptysis.   3. Obesity pt counseled on weight gain, will be set up for medicare benefit for individual coaching. He is agreeable. Discussed roles of starches/sugars.  4.   General Counseling: I have discussed the findings of the evaluation and examination with Fayrene Fearing.  I have also discussed any further diagnostic evaluation thatmay be needed or ordered today. Cher verbalizes understanding of the findings of todays visit. We also reviewed his medications today and discussed drug interactions and side effects including but not limited excessive drowsiness and altered mental states. We also discussed that there is always a risk not just to him but also people around him. he has been encouraged to call the office with any questions or concerns that should arise related to todays visit.  No orders of the defined types were placed in this encounter.       I have personally obtained a history, examined the patient, evaluated laboratory and imaging results, formulated the assessment and plan and placed orders. This patient was seen today by Emmaline Kluver, PA-C in collaboration with Dr. Freda Munro.   Valentino Hue Sol Blazing, PhD, FAASM  Diplomate, American  Board of Sleep Medicine    Yevonne Pax, MD [facility] Diplomate ABMS Pulmonary and Critical Care Medicine Sleep medicine

## 2020-04-20 ENCOUNTER — Other Ambulatory Visit: Payer: Self-pay | Admitting: Neurology

## 2020-04-23 ENCOUNTER — Other Ambulatory Visit: Payer: Self-pay | Admitting: Neurology

## 2020-04-23 ENCOUNTER — Other Ambulatory Visit (HOSPITAL_COMMUNITY): Payer: Self-pay | Admitting: Neurology

## 2020-04-23 DIAGNOSIS — R29898 Other symptoms and signs involving the musculoskeletal system: Secondary | ICD-10-CM

## 2020-04-23 DIAGNOSIS — M545 Low back pain, unspecified: Secondary | ICD-10-CM

## 2020-04-24 ENCOUNTER — Encounter: Payer: Self-pay | Admitting: Internal Medicine

## 2020-04-24 ENCOUNTER — Ambulatory Visit: Payer: Medicare Other | Admitting: Internal Medicine

## 2020-04-24 VITALS — Ht 71.0 in | Wt 257.0 lb

## 2020-04-24 DIAGNOSIS — Z6830 Body mass index (BMI) 30.0-30.9, adult: Secondary | ICD-10-CM

## 2020-04-24 DIAGNOSIS — E669 Obesity, unspecified: Secondary | ICD-10-CM | POA: Insufficient documentation

## 2020-04-24 NOTE — Progress Notes (Unsigned)
Met with pt today to initiate Intensive Behavioral Coaching for Obesity benefit. Referred by sleep medicine. Pt has been obese for many years- has utilized programs such as [facility] with varying amount of success. 15 min of time was spent.   He currently has a BMI of 35.84  Would like to normalize weight or get out of obesity category= Goal.   Structure of visits reviewed with pt.   Typical day of eating includes a slice of toast with cheddar cheese in am, black coffee.  Lunch  Pimiento cheese sandwich.  Dinner- eats out, typically [employer], [employer].   Instructions: keep food log for one week Start with high protein breakfast: have either 2-3 eggs or Oikos Pro yogurt or Premier protein shake along with one slice toast.   Next week will review lunch. Pt will be seen in office next visit.   Tressie Ellis PA-C

## 2020-05-01 ENCOUNTER — Other Ambulatory Visit: Payer: Self-pay

## 2020-05-01 ENCOUNTER — Ambulatory Visit: Payer: Medicare Other | Admitting: Internal Medicine

## 2020-05-01 VITALS — Ht 71.0 in | Wt 254.8 lb

## 2020-05-01 DIAGNOSIS — Z6830 Body mass index (BMI) 30.0-30.9, adult: Secondary | ICD-10-CM

## 2020-05-01 DIAGNOSIS — E669 Obesity, unspecified: Secondary | ICD-10-CM

## 2020-05-01 NOTE — Progress Notes (Signed)
[facility]  [facility] [address] [place] [address]   Pt was seen for a weight management visit. His BMI is 35.54. He was given objective to work on his proteins for breakfast. He has done that to some degree. We talked about reducing bagels and substituting english muffins. He will try that.   Portion control guidelines given for lunch/supper. He is to continue logging his food intake.   25 min spent face to face providing nutrition education and portion control guidance.   He will return in one week. Goal continues to be to normalize BMI.    Dx: obesity BMI 30-39.9  Emmaline Kluver PA-C

## 2020-05-01 NOTE — Patient Instructions (Signed)
Keep up with log Work on planning meals with more protein Follow handout

## 2020-05-08 ENCOUNTER — Ambulatory Visit: Payer: Medicare Other | Admitting: Physician Assistant

## 2020-05-22 ENCOUNTER — Ambulatory Visit (INDEPENDENT_AMBULATORY_CARE_PROVIDER_SITE_OTHER): Payer: Medicare Other | Admitting: Internal Medicine

## 2020-05-22 VITALS — Ht 71.0 in | Wt 246.4 lb

## 2020-05-22 DIAGNOSIS — E669 Obesity, unspecified: Secondary | ICD-10-CM | POA: Diagnosis not present

## 2020-05-22 DIAGNOSIS — Z6834 Body mass index (BMI) 34.0-34.9, adult: Secondary | ICD-10-CM

## 2020-05-22 NOTE — Progress Notes (Signed)
Weight Management Visit  Pt seen by Emmaline Kluver PA-C for weight management. This is week 3. First visit was 04/25/19. Pt has achieved weight loss of 11 lbs so far (initial weight was 257 lbs) today's weight was 246 lbs 6.4 oz.   He is doing well. Log was reviewed. He is achieving excellent protein intake and has reduced overall fat/carb intake.   His pants are looser.   20 min spent face to face on visit all of which was spent on nutrition education and planning.   Goals of increasing physical activity reviewed.   Next visit- one week   Emmaline Kluver PA-C

## 2020-05-29 ENCOUNTER — Ambulatory Visit (INDEPENDENT_AMBULATORY_CARE_PROVIDER_SITE_OTHER): Payer: Medicare Other | Admitting: Physician Assistant

## 2020-05-29 VITALS — Ht 71.0 in | Wt 245.2 lb

## 2020-05-29 DIAGNOSIS — Z6834 Body mass index (BMI) 34.0-34.9, adult: Secondary | ICD-10-CM | POA: Diagnosis not present

## 2020-05-29 DIAGNOSIS — E669 Obesity, unspecified: Secondary | ICD-10-CM | POA: Diagnosis not present

## 2020-05-29 NOTE — Progress Notes (Signed)
Pt seen for visit for intensive behavioral coaching for obesity. 15 min spent face to face providing nutrition education, reviewing log, and discussing goals. His goal is to reach 225 lbs. I think this is realistic in 6 months.   He has lost weight- another 1.2 lbs from last visit.   Next visit will focus on activity to enhance weight loss.   Emmaline Kluver PA-C

## 2020-06-18 ENCOUNTER — Ambulatory Visit
Admission: RE | Admit: 2020-06-18 | Discharge: 2020-06-18 | Disposition: A | Discharge status: Home / Self Care | Payer: Medicare Other | Source: Ambulatory Visit | Attending: Neurology | Admitting: Neurology

## 2020-06-18 ENCOUNTER — Other Ambulatory Visit: Payer: Self-pay

## 2020-06-18 DIAGNOSIS — M545 Low back pain, unspecified: Secondary | ICD-10-CM | POA: Diagnosis present

## 2020-06-18 DIAGNOSIS — R29898 Other symptoms and signs involving the musculoskeletal system: Secondary | ICD-10-CM | POA: Diagnosis present

## 2020-06-18 IMAGING — MR MR HEAD WO/W CM
15 series · 48 of 48 positions shown · IV contrast (gadavist)
Comparison: None.

CLINICAL DATA: Right shoulder and arm pain.  Leg weakness.

EXAM:
MRI HEAD WITHOUT AND WITH CONTRAST
TECHNIQUE: Multiplanar, multiecho pulse sequences of the brain and surrounding
structures were obtained without and with intravenous contrast.
CONTRAST:  10mL GADAVIST GADOBUTROL 1 MMOL/ML IV SOLN

[Series 5: ax dwi_tracew · axial · 3.0mm · 0.65mm/px · z∈[-84,+78]mm · 2 of 50 slices shown]
[im 1/50]
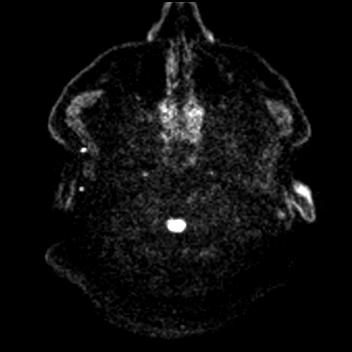
[im 50/50]
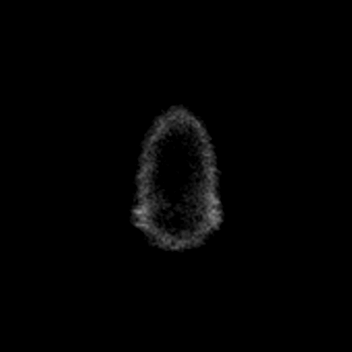

[Series 6: ax dwi_adc · axial · 3.0mm · 0.65mm/px · z∈[-84,+78]mm · 3 of 50 slices shown]
[im 1/50]
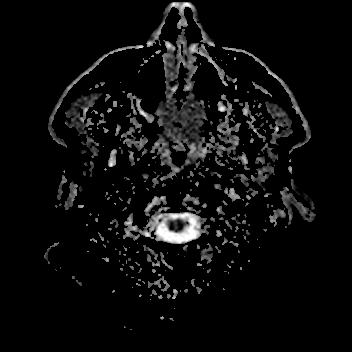
[im 25/50]
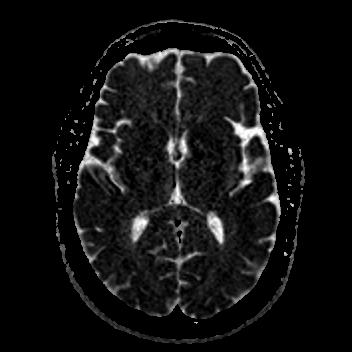
[im 50/50]
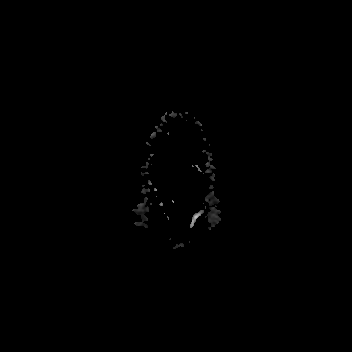

[Series 7: cor dwi_tracew · coronal · 5.0mm · 0.65mm/px · 2 of 40 slices shown]
[im 1/40]
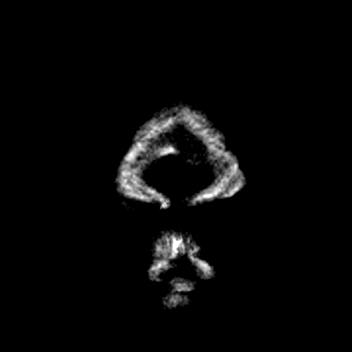
[im 40/40]
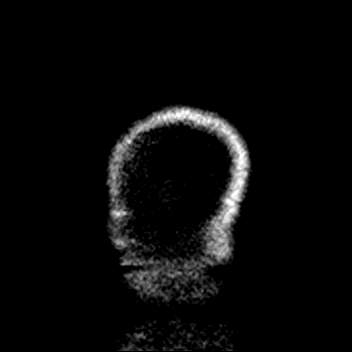

[Series 8: cor dwi_adc · coronal · 5.0mm · 0.65mm/px · 2 of 40 slices shown]
[im 1/40]
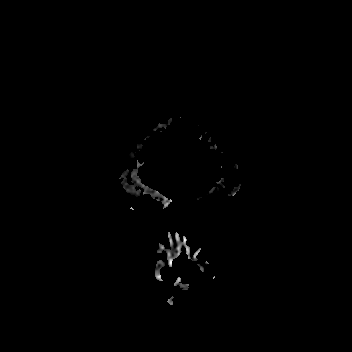
[im 40/40]
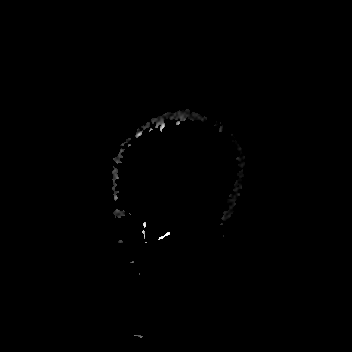

[Series 9: T1 · sagittal · 5.0mm · 0.62mm/px · 1 of 25 slices shown (1 of 2)]
[im 1/25]
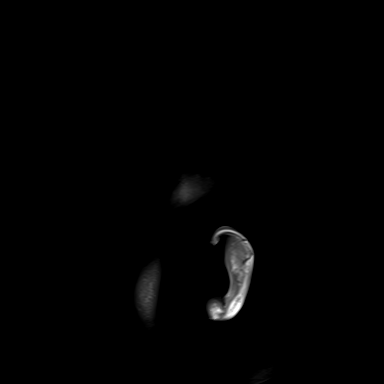

[Series 10: T2 · axial · 3.0mm · 0.53mm/px · z∈[-84,+78]mm · 3 of 50 slices shown]
[im 1/50]
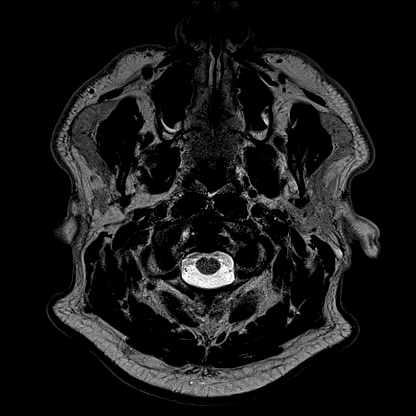
[im 25/50]
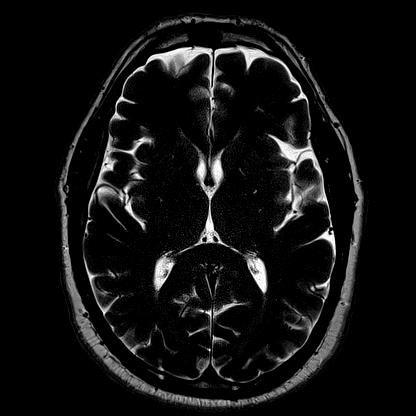
[im 50/50]
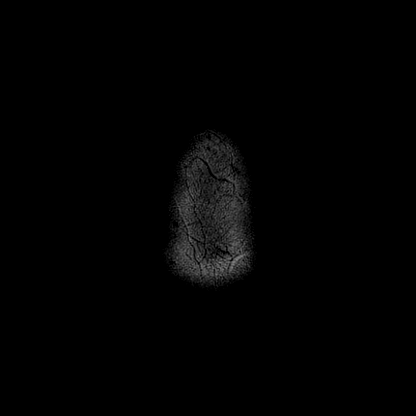

[Series 11: mag_images · axial · 3.0mm · 0.90mm/px · z∈[-92,+85]mm · 3 of 60 slices shown]
[im 1/60]
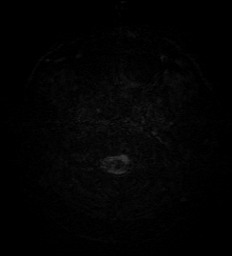
[im 30/60]
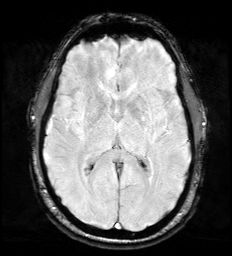
[im 60/60]
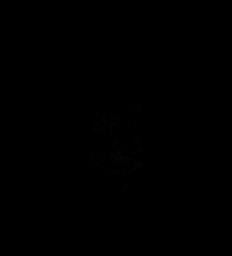

[Series 12: pha_images · axial · 3.0mm · 0.90mm/px · z∈[-92,+79]mm · 3 of 58 slices shown]
[im 1/58]
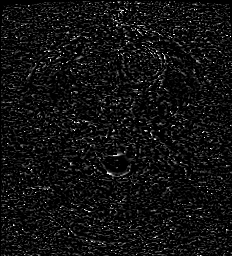
[im 29/58]
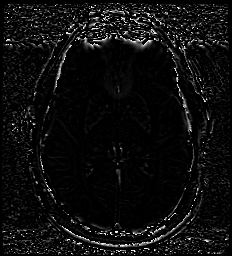
[im 58/58]
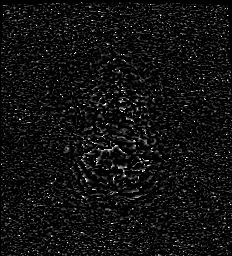

[Series 13: swi_images · axial · 3.0mm · 0.90mm/px · z∈[-92,+85]mm · 3 of 60 slices shown]
[im 1/60]
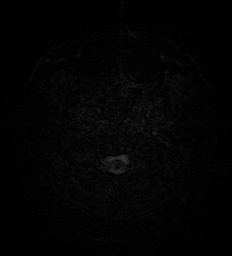
[im 30/60]
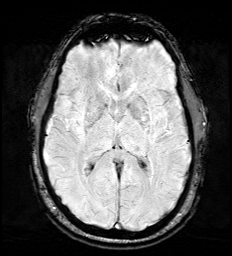
[im 60/60]
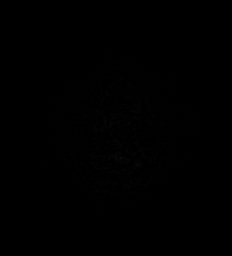

[Series 14: mip_images(sw) · axial · 24.0mm · 0.90mm/px · z∈[-81,+74]mm · 3 of 53 slices shown]
[im 1/53]
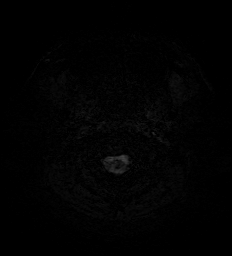
[im 27/53]
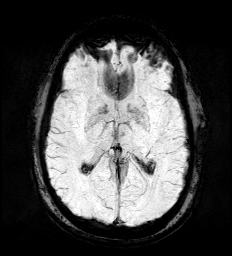
[im 53/53]
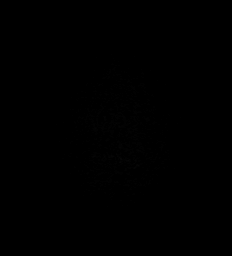

[Series 15: FLAIR · axial · 3.0mm · 0.53mm/px · z∈[-84,+78]mm · 3 of 55 slices shown]
[im 1/55]
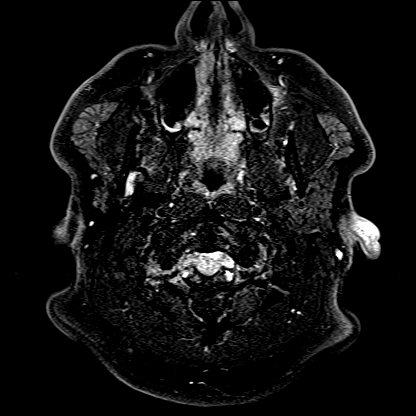
[im 28/55]
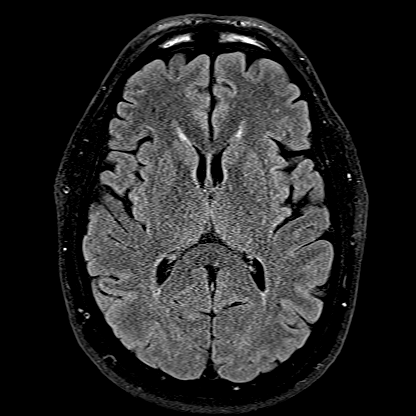
[im 55/55]
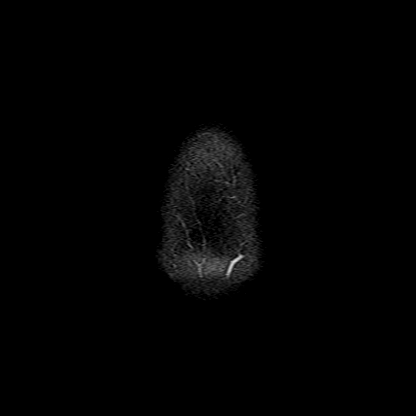

[Series 16: T1 · axial · 1.0mm · 0.98mm/px · z∈[-92,+82]mm · 9 of 176 slices shown (2 of 2)]
[im 1/176]
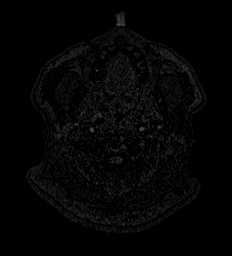
[im 22/176]
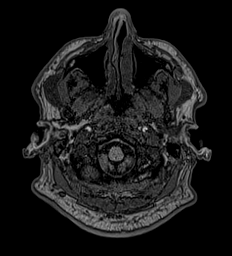
[im 44/176]
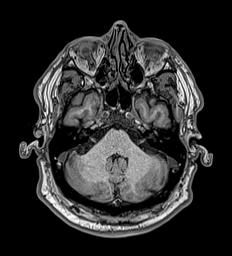
[im 66/176]
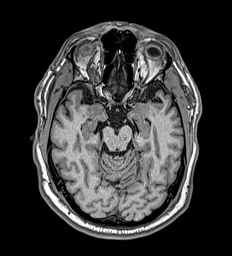
[im 88/176]
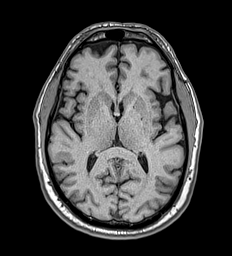
[im 110/176]
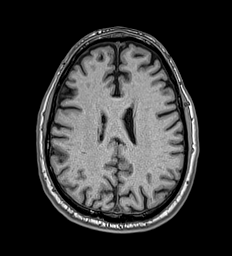
[im 132/176]
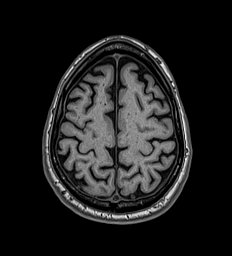
[im 154/176]
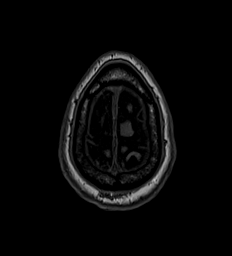
[im 176/176]
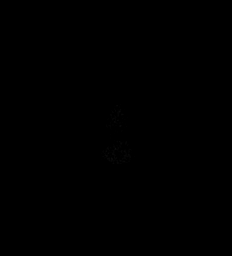

[Series 32: T2 post-contrast · coronal · 5.0mm · 0.57mm/px · 1 of 29 slices shown]
[im 1/29]
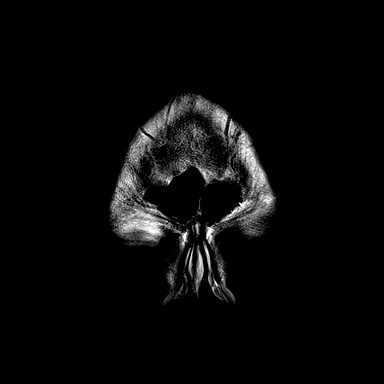

[Series 33: T1 post-contrast · axial · 1.0mm · 0.98mm/px · z∈[-92,+82]mm · 9 of 176 slices shown (1 of 2)]
[im 1/176]
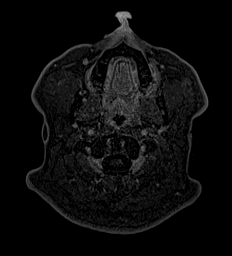
[im 22/176]
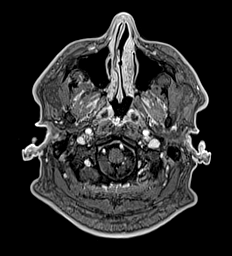
[im 44/176]
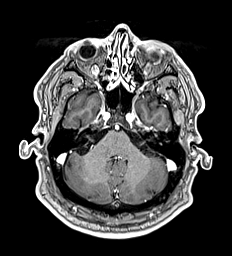
[im 66/176]
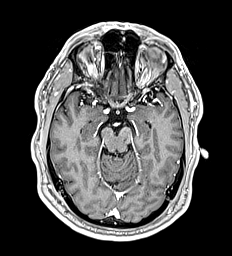
[im 88/176]
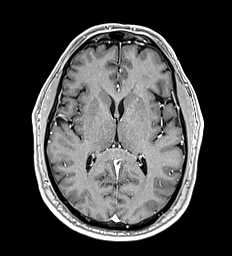
[im 110/176]
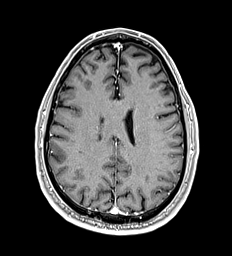
[im 132/176]
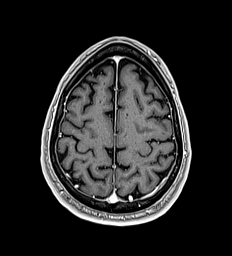
[im 154/176]
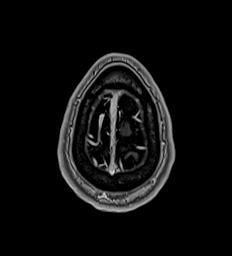
[im 176/176]
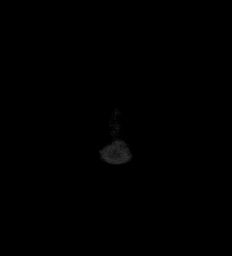

[Series 34: T1 post-contrast · coronal · 5.0mm · 0.57mm/px · 1 of 29 slices shown (2 of 2)]
[im 1/29]
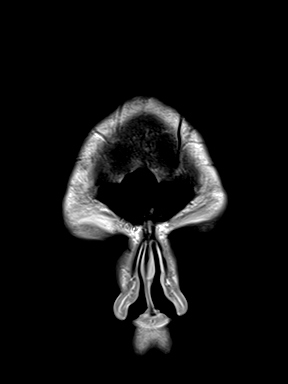

[48 of 48 positions shown; findings below may reference images not displayed]

FINDINGS: Brain: No acute infarction, hemorrhage, hydrocephalus, extra-axial
collection or mass lesion. Small hyperintensity in the left temporal
white matter. Remaining white matter negative. Negative brainstem.

Normal enhancement.

Vascular: Normal arterial flow voids.

Skull and upper cervical spine: No focal skeletal lesion.

Sinuses/Orbits: Mild mucosal edema paranasal sinuses. Negative orbit

Other: None
IMPRESSION: Normal for age MRI of the brain with contrast

Mild sinus mucosal edema.

## 2020-06-18 IMAGING — MR MR THORACIC SPINE WO/W CM
7 of 9 series · 30 of 48 positions shown · IV contrast (gadavist)
Comparison: MRI lumbar spine [DATE]

CLINICAL DATA: Back pain with bilateral leg pain

EXAM:
MRI THORACIC WITHOUT AND WITH CONTRAST
TECHNIQUE: Multiplanar and multiecho pulse sequences of the thoracic spine were
obtained without and with intravenous contrast.
CONTRAST:  10mL GADAVIST GADOBUTROL 1 MMOL/ML IV SOLN

[Series 16: T1 · sagittal · 5.0mm · 1.88mm/px · 1 of 9 slices shown (1 of 3)]
[im 1/9]
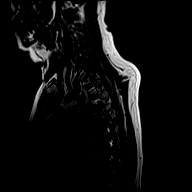

[Series 17: T2 · sagittal · 3.0mm · 1.06mm/px · 3 of 21 slices shown (1 of 2)]
[im 1/21]
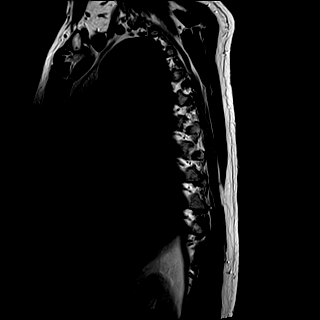
[im 11/21]
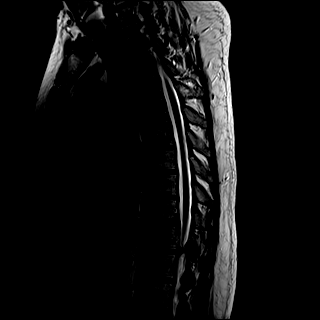
[im 21/21]
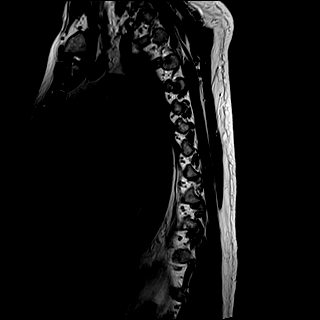

[Series 18: T1 · sagittal · 3.0mm · 1.06mm/px · 4 of 21 slices shown (2 of 3)]
[im 1/21]
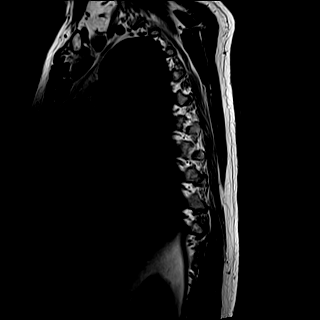
[im 7/21]
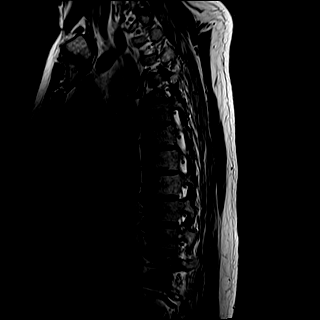
[im 14/21]
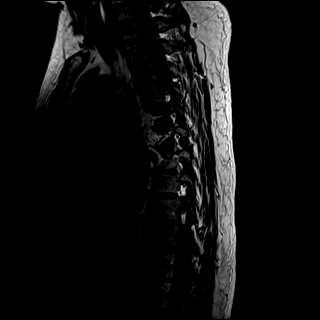
[im 21/21]
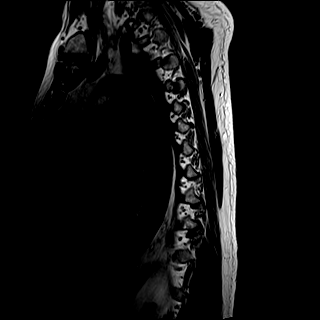

[Series 19: STIR · sagittal · 3.0mm · 0.53mm/px · 2 of 21 slices shown]
[im 1/21]
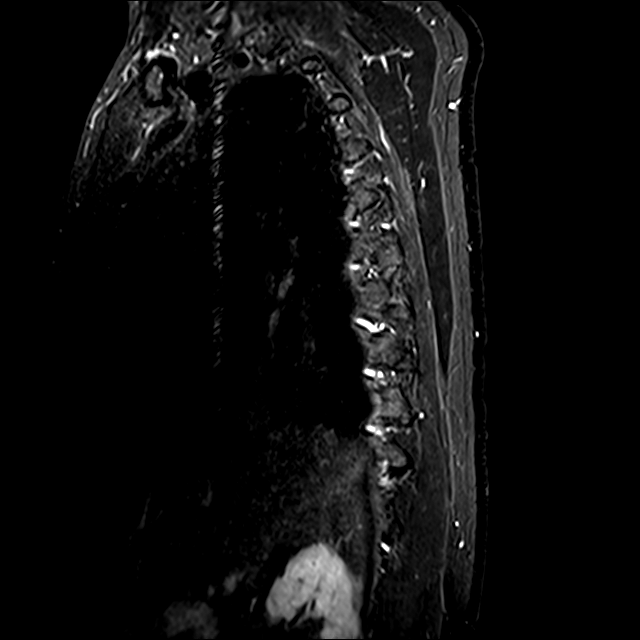
[im 7/21]
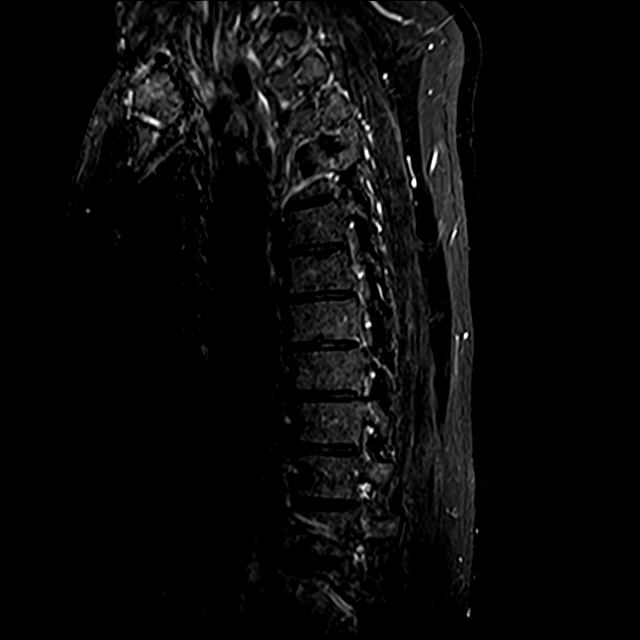

[Series 20: T2 · axial · 4.0mm · 0.59mm/px · z∈[-520,-232]mm · 8 of 39 slices shown (2 of 2)]
[im 1/39]
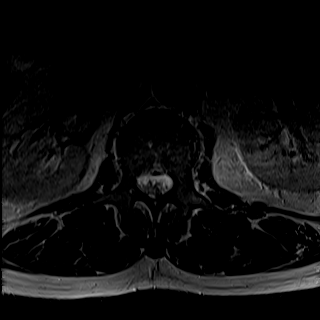
[im 6/39]
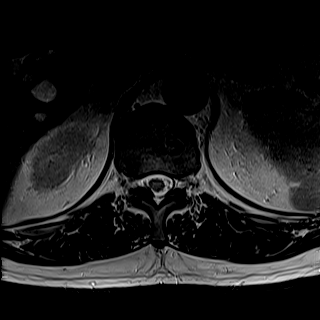
[im 11/39]
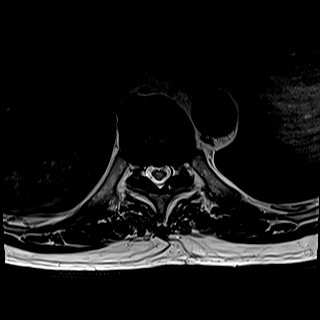
[im 17/39]
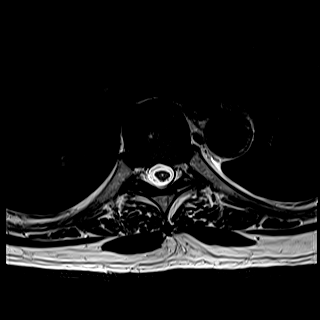
[im 22/39]
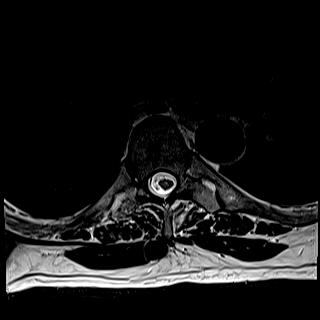
[im 28/39]
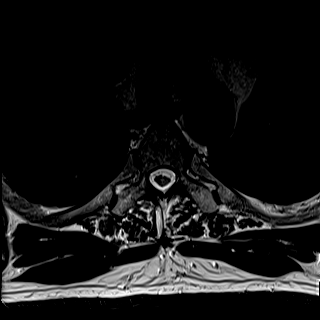
[im 33/39]
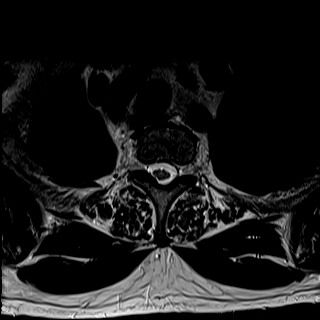
[im 39/39]
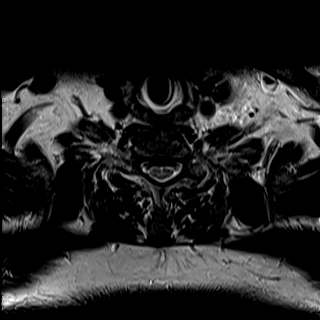

[Series 22: T1 · axial · non-contrast · 4.0mm · 0.37mm/px · z∈[-520,-232]mm · 8 of 39 slices shown (3 of 3)]
[im 1/39]
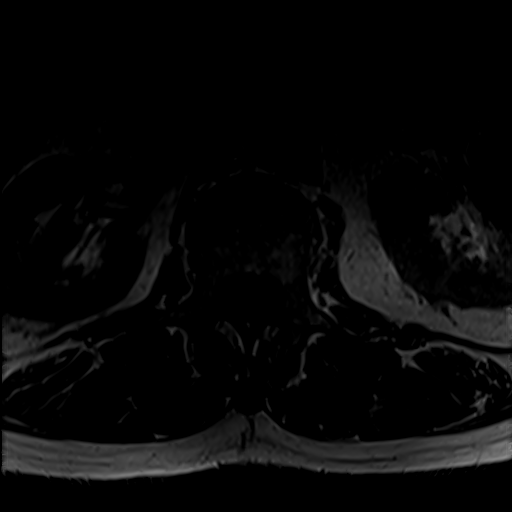
[im 6/39]
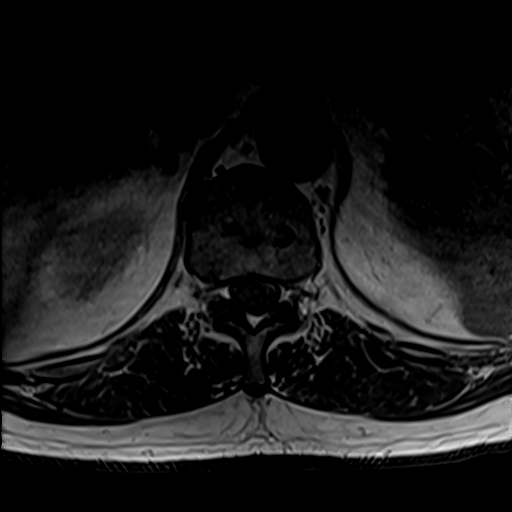
[im 11/39]
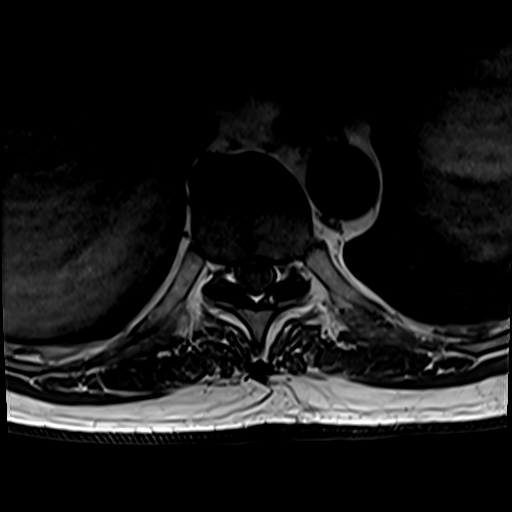
[im 17/39]
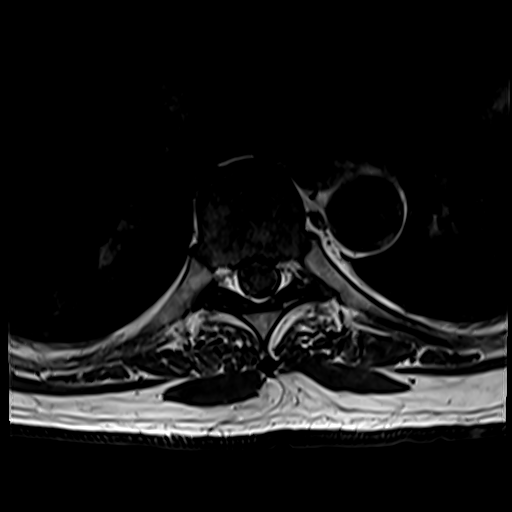
[im 22/39]
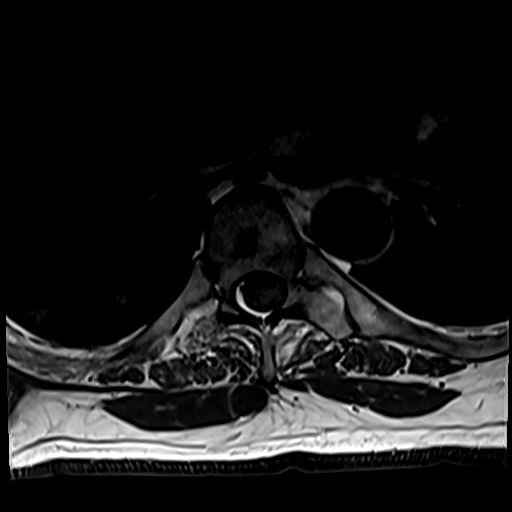
[im 28/39]
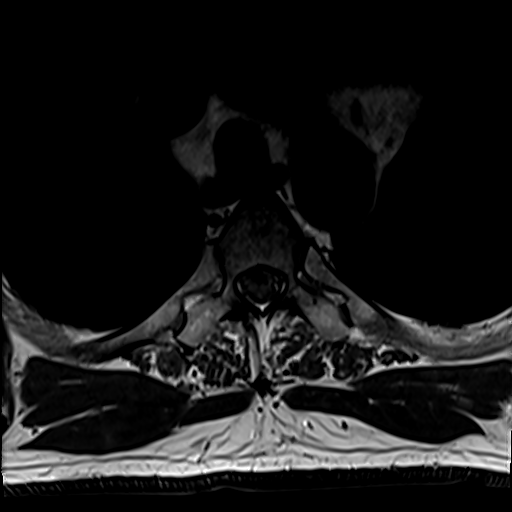
[im 33/39]
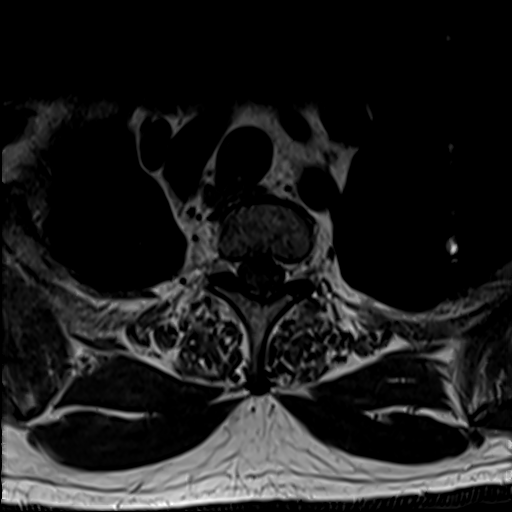
[im 39/39]
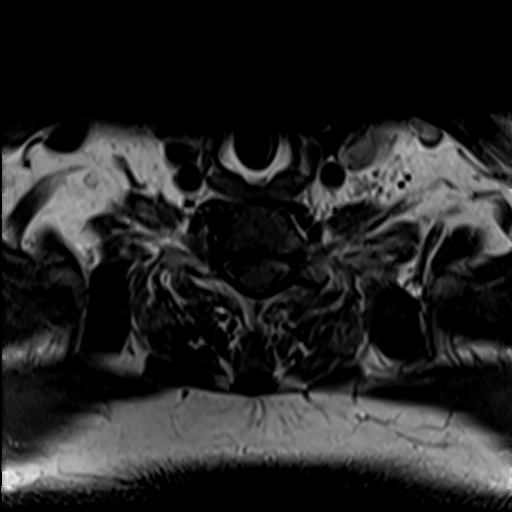

[Series 24: T1 fat-sat post-contrast · sagittal · 3.0mm · 1.06mm/px · 4 of 21 slices shown]
[im 1/21]
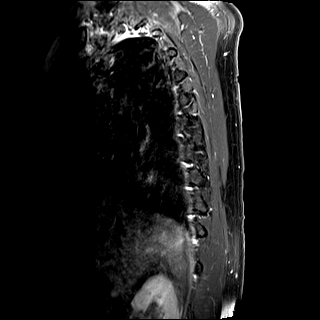
[im 7/21]
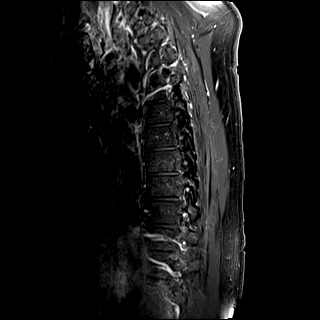
[im 14/21]
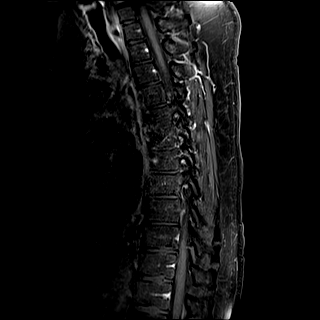
[im 21/21]
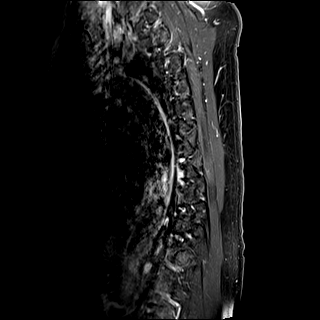

[30 of 48 positions shown; findings below may reference images not displayed]

FINDINGS: Alignment: Normal alignment. Mild dextroscoliosis midthoracic spine

Vertebrae: Normal bone marrow. Negative for fracture or mass. No
enhancing lesions in the bone marrow. Small hemangioma T6 vertebral
body.

Cord: Small hyperintensity in the central cord T7 and T8. Possible
small syrinx. This measures 1 mm.

Paraspinal and other soft tissues: Negative for paraspinous mass or
adenopathy.

Disc levels:

Disc degeneration and Schmorl's nodes between T4 and T12. Small
central disc protrusion and mild facet degeneration at T9-10 without
significant spinal stenosis.

T10-11: Right sided disc protrusion with right foraminal
encroachment. Bilateral facet degeneration. Mild spinal stenosis.

T11-12: Mild disc and mild facet degeneration without significant
stenosis.
IMPRESSION: Mild scoliosis.  Multilevel degenerative change

Right foraminal disc protrusion T10-11 with right foraminal
encroachment and mild spinal stenosis.

Mild disc and facet degeneration T9-10 and T11-12 without stenosis.

## 2020-06-18 MED ORDER — GADOBUTROL 1 MMOL/ML IV SOLN
10.0000 mL | Freq: Once | INTRAVENOUS | Status: AC | PRN
  Administered 2020-06-18: 10 mL via INTRAVENOUS

## 2020-06-19 ENCOUNTER — Ambulatory Visit (INDEPENDENT_AMBULATORY_CARE_PROVIDER_SITE_OTHER): Payer: Medicare Other | Admitting: Internal Medicine

## 2020-06-19 VITALS — Ht 71.0 in | Wt 245.0 lb

## 2020-06-19 DIAGNOSIS — Z6834 Body mass index (BMI) 34.0-34.9, adult: Secondary | ICD-10-CM | POA: Diagnosis not present

## 2020-06-19 DIAGNOSIS — E669 Obesity, unspecified: Secondary | ICD-10-CM

## 2020-06-19 NOTE — Progress Notes (Signed)
Pt seen for weight management visit. Has lost 3.2 oz since last visit, overall has lost almost 10 lbs.   Doing well, but forgot to bring log book. Upcoming trip to the [place] later this week.   Discussed managing buffets and restaurants, increased activity to manage potential weight gain while at all inclusive resort.   20 min spent face to face in nutrition education/planning.   Assessment:  1. Obesity- plan to reduce total calorie intake while on vacation by  Increasing proteins and vegetable intake 2. BMI 34.0-34.9   Plan follow up in 2 weeks.

## 2020-06-20 NOTE — Progress Notes (Signed)
[facility] [place] [facility]  [address], [address] [place], [place] [address] Phone: [phone]  Fax: [phone]   Clinic Day:  06/21/2020  Referring physician: Dr. Cristopher PeruHemang Shah  Chief Complaint: Nash ShearerJames Edwin Lee is a 70 y.o. male with low serum IgG and IgM who is referred in consultation by Dr. Cristopher PeruHemang Shah for assessment and management.   HPI: The patient was seen by Dr. Sherryll BurgerShah on 04/20/2020 regarding back pain. He was noted to have diffuse enhancement of the left S1 nerve root with a previous history of lumbar surgery and epidural steroid injections. He has residual back and leg pain.  They discussed ruling out infectious, inflammatory, and auto immune causes.  There was a low suspicion of neurosarcoidosis, post Covid mononeuropathy and lymphoma.  Multiple labs were ordered.   SPEP showed an IgG of 503 [phone]) and an IgM of <5 (20-172). 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 Sed rate was 4. Vitamin B12 was 514 and folate >22.3.  ANA was negative.  HIV testing was negative.  Symptomatically, he has been "good." He is intentionally losing weight by watching his diet. He has lost 18 lbs in the past 1.5 months. He has sinus infections every 1-1.5 years. He denies kidney problems, allergies, and diarrhea.  He denies fevers, sweats, headaches, changes in vision, runny nose, sore throat, cough, shortness of breath, allergies, chest pain, palpitations, nausea, vomiting, diarrhea, reflux, urinary symptoms, kidney problems, bone or joint symptoms, skin changes, numbness, weakness, balance or coordination problems, and bleeding of any kind.  He has sleep apnea and uses a CPAP.  He has residual back pain and leg pain post surgery. He has been getting injections in his lower right back and right shoulder.  His mother had liver and pancreatic cancer. His father had brain cancer.   Past Medical History:  Diagnosis Date  . Benign localized hyperplasia of prostate with urinary  obstruction   . Depressive disorder   . Dyslipidemia   . Hyperlipidemia   . Hypertension   . Hypogammaglobulinemia (HCC)   . Hypothyroidism   . Hypothyroidism   . OSA on CPAP   . Primary osteoarthritis of left foot   . Spinal stenosis of lumbar region with neurogenic claudication     Past Surgical History:  Procedure Laterality Date  . APPENDECTOMY    . BACK SURGERY    . FOOT SURGERY    . HERNIA REPAIR    . THYROIDECTOMY, PARTIAL    . VASECTOMY      Family History  Problem Relation Age of Onset  . Pancreatic cancer Mother   . Liver cancer Mother     Social History:  reports that he has been smoking cigars. He has never used smokeless tobacco. He reports current alcohol use. He reports that he does not use drugs. He smokes cigars occasionally. He very rarely drinks alcohol. He denies exposure to radiation or toxins. His wife passed away from pancreatic cancer. He worked for a company that [employer]. 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 He is going out of town from today until 06/29/2020.  The patient is alone today.  Allergies: No Known Allergies  Current Medications: Current Outpatient Medications  Medication Sig Dispense Refill  . atorvastatin (LIPITOR) 20 MG tablet     . buPROPion (WELLBUTRIN) 75 MG tablet Take 75 mg by mouth 2 (two) times daily.    . diclofenac (CATAFLAM) 50 MG tablet Take 50 mg by mouth 3 (three) times daily.    .Marland Kitchen  gabapentin (NEURONTIN) 100 MG capsule 300 mg in the morning, at noon, and at bedtime.    Marland Kitchen levothyroxine (SYNTHROID, LEVOTHROID) 125 MCG tablet 112 mcg. Two tablets twice daily     . losartan-hydrochlorothiazide (HYZAAR) 100-12.5 MG tablet     . oxybutynin (DITROPAN) 5 MG tablet Take 5 mg by mouth 3 (three) times daily.    . sertraline (ZOLOFT) 100 MG tablet Take 100 mg by mouth daily.    . NEOMYCIN-POLYMYXIN-HYDROCORTISONE (CORTISPORIN) 1 % SOLN OTIC solution Apply 1-2 drops to toe BID after soaking (Patient not taking: Reported on  06/21/2020) 10 mL 1   No current facility-administered medications for this visit.    Review of Systems  Constitutional: Positive for weight loss (18 lbs in 1.5 months, intentional). Negative for chills, diaphoresis, fever and malaise/fatigue.  HENT: Positive for hearing loss. Negative for congestion, ear discharge, ear pain, nosebleeds, sinus pain, sore throat and tinnitus.   Eyes: Negative for blurred vision.  Respiratory: Negative for cough, hemoptysis, sputum production and shortness of breath.        Sleep apnea. Uses CPAP.  Cardiovascular: Negative for chest pain, palpitations and leg swelling.  Gastrointestinal: Negative for abdominal pain, blood in stool, constipation, diarrhea, heartburn, melena, nausea and vomiting.  Genitourinary: Negative for dysuria, frequency, hematuria and urgency.  Musculoskeletal: Positive for back pain (spinal stenosis), joint pain (right shoulder, receives injections) and myalgias (legs). Negative for neck pain.  Skin: Negative for itching and rash.  Neurological: Negative for dizziness, tingling, sensory change, weakness and headaches.  Endo/Heme/Allergies: Does not bruise/bleed easily.  Psychiatric/Behavioral: Negative for depression and memory loss. The patient is not nervous/anxious and does not have insomnia.   All other systems reviewed and are negative.  Performance status (ECOG): 1  Vitals Blood pressure (!) 141/75, pulse 62, temperature 98 F (36.7 C), resp. rate 16, weight 244 lb 11.2 oz (111 kg).   Physical Exam Vitals and nursing note reviewed.  Constitutional:      General: He is not in acute distress.    Appearance: He is not diaphoretic.  HENT:     Head: Normocephalic and atraumatic.     Comments: Gray hair.    Mouth/Throat:     Mouth: Mucous membranes are dry.     Pharynx: Oropharynx is clear.  Eyes:     General: No scleral icterus.    Extraocular Movements: Extraocular movements intact.     Conjunctiva/sclera: Conjunctivae  normal.     Pupils: Pupils are equal, round, and reactive to light.     Comments: Glasses.  Blue eyes.  Cardiovascular:     Rate and Rhythm: Normal rate and regular rhythm.     Heart sounds: Normal heart sounds. No murmur heard.   Pulmonary:     Effort: Pulmonary effort is normal. No respiratory distress.     Breath sounds: Normal breath sounds. No wheezing or rales.  Chest:     Chest wall: No tenderness.  Breasts:     Right: No axillary adenopathy or supraclavicular adenopathy.     Left: No axillary adenopathy or supraclavicular adenopathy.    Abdominal:     General: Bowel sounds are normal. There is no distension.     Palpations: Abdomen is soft. There is no mass.     Tenderness: There is no abdominal tenderness. There is no guarding or rebound.  Musculoskeletal:        General: No swelling or tenderness. Normal range of motion.     Cervical back: Normal range of  motion and neck supple.  Lymphadenopathy:     Head:     Right side of head: No preauricular, posterior auricular or occipital adenopathy.     Left side of head: No preauricular, posterior auricular or occipital adenopathy.     Cervical: No cervical adenopathy.     Upper Body:     Right upper body: No supraclavicular or axillary adenopathy.     Left upper body: No supraclavicular or axillary adenopathy.     Lower Body: No right inguinal adenopathy. No left inguinal adenopathy.  Skin:    General: Skin is warm and dry.  Neurological:     Mental Status: He is alert and oriented to person, place, and time.  Psychiatric:        Behavior: Behavior normal.        Thought Content: Thought content normal.        Judgment: Judgment normal.    No visits with results within 3 Day(s) from this visit.  Latest known visit with results is:  Office Visit on 08/04/2018  Component Date Value Ref Range Status  . Total Protein 08/05/2018 6.7  6.0 - 8.5 g/dL Final  . Albumin 23/76/2831 4.5  3.8 - 4.8 g/dL Final  . Bilirubin  Total 08/05/2018 0.3  0.0 - 1.2 mg/dL Final  . Bilirubin, Direct 08/05/2018 0.07  0.00 - 0.40 mg/dL Final  . Alkaline Phosphatase 08/05/2018 47  39 - 117 IU/L Final  . AST 08/05/2018 21  0 - 40 IU/L Final  . ALT 08/05/2018 22  0 - 44 IU/L Final    Assessment:  Donald Lee is a 70 y.o. 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  He has a previous history of lumbar surgery and epidural steroid injections. He notes sinus infections every 1-1.5 years.  Labs on 04/20/2020 revealed an SPEP with an IgG of 503 ([phone]) and an IgM of <5 (20-172). 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 Sed rate was 4.  ANA and HIV testing were negative.  The patient received the Moderna COVID-19 vaccine on 06/27/2019 and 07/11/2019. He received the [employer] on 03/20/2020.  Symptomatically, he has residual back pain and leg pain s/p surgery.  He denies any fevers or sweats.  He is intentionally losing weight.  Exam reveals no adenopathy or hepatosplenomegly.  Plan: 1.   Labs today:  CBC with diff, CMP, LDH, myeloma panel, IgG (1,2,3,4). 2.   Peripheral smear for pathologic review. 3.   Hypogammaglobulinemia  Discuss primary and secondary hypogammaglobulinemia.  Primary: common variable immune deficiency (CVID).  Secondary:  medications, malignancies (CLL, lymphomas), thymoma, enteropathies (GI or GU).  Discuss work-up with labs and screening urinalysis to r/o proteinuria.   If proteinuria, plan to quantify and check 24 hour urine for UPEP and free light chains. 4.   RTC in 2 weeks for MD assessment and discussion regarding direction of therapy.  I discussed the assessment and treatment plan with the patient.  The patient was provided an opportunity to ask questions and all were answered.  The patient agreed with the plan and demonstrated an understanding of the instructions.  The patient was advised to call back if the  symptoms worsen or if the condition fails to improve as anticipated.  I provided 21 minutes of face-to-face time during this this encounter and > 50% was spent counseling as documented under my assessment and plan.  An additional 10 minutes were spent reviewing his chart (  Epic and Care Everywhere) including notes, labs, and imaging studies.    Aziyah Provencal C. Merlene Pulling, MD, PhD    06/21/2020, 11:31 AM  I, Danella Penton Tufford, am acting as Neurosurgeon for [employer]. Merlene Pulling, MD, PhD.  I, Sallie Maker C. Merlene Pulling, MD, have reviewed the above documentation for accuracy and completeness, and I agree with the above.

## 2020-06-21 ENCOUNTER — Inpatient Hospital Stay: Payer: Medicare Other | Attending: Hematology and Oncology | Admitting: Hematology and Oncology

## 2020-06-21 ENCOUNTER — Encounter: Payer: Self-pay | Admitting: Hematology and Oncology

## 2020-06-21 ENCOUNTER — Inpatient Hospital Stay: Payer: Medicare Other

## 2020-06-21 ENCOUNTER — Other Ambulatory Visit: Payer: Self-pay

## 2020-06-21 VITALS — BP 141/75 | HR 62 | Temp 98.0°F | Resp 16 | Wt 244.7 lb

## 2020-06-21 DIAGNOSIS — M25511 Pain in right shoulder: Secondary | ICD-10-CM | POA: Insufficient documentation

## 2020-06-21 DIAGNOSIS — D801 Nonfamilial hypogammaglobulinemia: Secondary | ICD-10-CM

## 2020-06-21 DIAGNOSIS — Z808 Family history of malignant neoplasm of other organs or systems: Secondary | ICD-10-CM | POA: Diagnosis not present

## 2020-06-21 DIAGNOSIS — M4800 Spinal stenosis, site unspecified: Secondary | ICD-10-CM | POA: Insufficient documentation

## 2020-06-21 DIAGNOSIS — H9190 Unspecified hearing loss, unspecified ear: Secondary | ICD-10-CM | POA: Diagnosis not present

## 2020-06-21 DIAGNOSIS — F17290 Nicotine dependence, other tobacco product, uncomplicated: Secondary | ICD-10-CM | POA: Diagnosis not present

## 2020-06-21 DIAGNOSIS — Z800 Family history of malignant neoplasm of digestive organs: Secondary | ICD-10-CM | POA: Diagnosis not present

## 2020-06-21 DIAGNOSIS — G4730 Sleep apnea, unspecified: Secondary | ICD-10-CM | POA: Diagnosis not present

## 2020-06-21 LAB — COMPREHENSIVE METABOLIC PANEL
ALT: 25 U/L (ref 0–44)
AST: 19 U/L (ref 15–41)
Albumin: 4.4 g/dL (ref 3.5–5.0)
Alkaline Phosphatase: 41 U/L (ref 38–126)
Anion gap: 10 (ref 5–15)
BUN: 28 mg/dL — ABNORMAL HIGH (ref 8–23)
CO2: 26 mmol/L (ref 22–32)
Calcium: 9.4 mg/dL (ref 8.9–10.3)
Chloride: 101 mmol/L (ref 98–111)
Creatinine, Ser: 1.04 mg/dL (ref 0.61–1.24)
GFR, Estimated: >60 mL/min (ref >60)
Glucose, Bld: 100 mg/dL — ABNORMAL HIGH (ref 70–99)
Potassium: 3.9 mmol/L (ref 3.5–5.1)
Sodium: 137 mmol/L (ref 135–145)
Total Bilirubin: 0.7 mg/dL (ref 0.3–1.2)
Total Protein: 6.8 g/dL (ref 6.5–8.1)

## 2020-06-21 LAB — CBC WITH DIFFERENTIAL/PLATELET
Abs Immature Granulocytes: 0.03 10*3/uL (ref 0.00–0.07)
Basophils Absolute: 0.1 10*3/uL (ref 0.0–0.1)
Basophils Relative: 1 %
Eosinophils Absolute: 0.2 10*3/uL (ref 0.0–0.5)
Eosinophils Relative: 2 %
HCT: 37.1 % — ABNORMAL LOW (ref 39.0–52.0)
Hemoglobin: 12.9 g/dL — ABNORMAL LOW (ref 13.0–17.0)
Immature Granulocytes: 0 %
Lymphocytes Relative: 18 %
Lymphs Abs: 1.4 10*3/uL (ref 0.7–4.0)
MCH: 33.4 pg (ref 26.0–34.0)
MCHC: 34.8 g/dL (ref 30.0–36.0)
MCV: 96.1 fL (ref 80.0–100.0)
Monocytes Absolute: 0.6 10*3/uL (ref 0.1–1.0)
Monocytes Relative: 8 %
Neutro Abs: 5.7 10*3/uL (ref 1.7–7.7)
Neutrophils Relative %: 71 %
Platelets: 273 10*3/uL (ref 150–400)
RBC: 3.86 MIL/uL — ABNORMAL LOW (ref 4.22–5.81)
RDW: 12.1 % (ref 11.5–15.5)
WBC: 8 10*3/uL (ref 4.0–10.5)
nRBC: 0 % (ref 0.0–0.2)

## 2020-06-21 LAB — URINALYSIS, COMPLETE (UACMP) WITH MICROSCOPIC
Bacteria, UA: NONE SEEN
Bilirubin Urine: NEGATIVE
Glucose, UA: NEGATIVE mg/dL
Hgb urine dipstick: NEGATIVE
Ketones, ur: NEGATIVE mg/dL
Leukocytes,Ua: NEGATIVE
Nitrite: NEGATIVE
Protein, ur: NEGATIVE mg/dL
RBC / HPF: NONE SEEN RBC/hpf (ref 0–5)
Specific Gravity, Urine: 1.01 (ref 1.005–1.030)
Squamous Epithelial / HPF: NONE SEEN (ref 0–5)
pH: 5.5 (ref 5.0–8.0)

## 2020-06-21 LAB — LACTATE DEHYDROGENASE: LDH: 117 U/L (ref 98–192)

## 2020-06-21 LAB — PATHOLOGIST SMEAR REVIEW

## 2020-06-21 NOTE — Progress Notes (Signed)
New patient evaluation.   

## 2020-06-22 LAB — IGG 1, 2, 3, AND 4
IgG (Immunoglobin G), Serum: 558 mg/dL — ABNORMAL LOW (ref 603–1613)
IgG, Subclass 1: 368 mg/dL (ref 248–810)
IgG, Subclass 2: 106 mg/dL — ABNORMAL LOW (ref 130–555)
IgG, Subclass 3: 16 mg/dL (ref 15–102)
IgG, Subclass 4: 6 mg/dL (ref 2–96)

## 2020-06-25 LAB — MULTIPLE MYELOMA PANEL, SERUM
Albumin SerPl Elph-Mcnc: 3.8 g/dL (ref 2.9–4.4)
Albumin/Glob SerPl: 1.7 (ref 0.7–1.7)
Alpha 1: 0.1 g/dL (ref 0.0–0.4)
Alpha2 Glob SerPl Elph-Mcnc: 0.8 g/dL (ref 0.4–1.0)
B-Globulin SerPl Elph-Mcnc: 0.9 g/dL (ref 0.7–1.3)
Gamma Glob SerPl Elph-Mcnc: 0.5 g/dL (ref 0.4–1.8)
Globulin, Total: 2.3 g/dL (ref 2.2–3.9)
IgA: 81 mg/dL (ref 61–437)
IgG (Immunoglobin G), Serum: 559 mg/dL — ABNORMAL LOW (ref 603–1613)
IgM (Immunoglobulin M), Srm: <5 mg/dL — ABNORMAL LOW (ref 20–172)
Total Protein ELP: 6.1 g/dL (ref 6.0–8.5)

## 2020-07-02 ENCOUNTER — Ambulatory Visit: Payer: Medicare Other | Admitting: Internal Medicine

## 2020-07-03 ENCOUNTER — Ambulatory Visit: Payer: Medicare Other | Admitting: Internal Medicine

## 2020-07-03 NOTE — Progress Notes (Signed)
[facility]  [address], [address] [place], [place] [address] Phone: [phone]  Fax: [phone]   Clinic Day:  07/05/2020  Referring physician: Dr. Cristopher Peru  Chief Complaint: Donald Lee is a 70 y.o. male with low serum IgG and IgM who is seen for review of work-up and discussion regarding direction of therapy.  HPI: The patient was last seen in the hematology clinic on 06/21/2020 for new patient assessment.  He was noted to have an abnormal SPEP (hypogammaglubulinemia) on neurology evaluation of enhancement of the left S1 nerve root.  At that time, he felt good.  He denied recurrent infections.  He had a sinus infection every 12-18 months.  Work-up revealed a hematocrit of 37.1, hemoglobin 12.9, MCV 96.1, platelets 273,000, WBC 8,000 with an ANC of 5700. Creatinine, calcium, albumin, and total protein were normal. Serum IgG was 558 (IgG subclass 2 was 106). LDH was 117. Urinalysis revealed no protein. M spike was 0.  Peripheral smear revealed normal WBC count and differential. There were normocytic erythrocytes, borderline anemia, without significant anisopoikilocytosis. Platelet count and morphology were normal.  During the interim, he has been "good." All of his symptoms are stable. His mouth is dry due to diclofenac. He denies dry eyes.   Past Medical History:  Diagnosis Date  . Benign localized hyperplasia of prostate with urinary obstruction   . Depressive disorder   . Dyslipidemia   . Hyperlipidemia   . Hypertension   . Hypogammaglobulinemia (HCC)   . Hypothyroidism   . Hypothyroidism   . OSA on CPAP   . Primary osteoarthritis of left foot   . Spinal stenosis of lumbar region with neurogenic claudication     Past Surgical History:  Procedure Laterality Date  . APPENDECTOMY    . BACK SURGERY    . FOOT SURGERY    . HERNIA REPAIR    . THYROIDECTOMY, PARTIAL    . VASECTOMY      Family History  Problem Relation Age of Onset  .  Pancreatic cancer Mother   . Liver cancer Mother     Social History:  reports that he has been smoking cigars. He has never used smokeless tobacco. He reports current alcohol use. He reports that he does not use drugs. He smokes cigars occasionally. He very rarely drinks alcohol. He denies exposure to radiation or toxins. His wife passed away from pancreatic cancer. 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 His wife is Dois Davenport. The patient is accompanied by his wife, Dois Davenport, today.  Allergies: No Known Allergies  Current Medications: Current Outpatient Medications  Medication Sig Dispense Refill  . atorvastatin (LIPITOR) 20 MG tablet Take 20 mg by mouth daily.    Marland Kitchen buPROPion (WELLBUTRIN) 75 MG tablet Take 75 mg by mouth 2 (two) times daily.    . diclofenac (CATAFLAM) 50 MG tablet Take 50 mg by mouth 3 (three) times daily.    Marland Kitchen gabapentin (NEURONTIN) 300 MG capsule Take 300 mg by mouth 3 (three) times daily.    Marland Kitchen levothyroxine (SYNTHROID, LEVOTHROID) 125 MCG tablet 112 mcg. Two tablets twice daily     . losartan-hydrochlorothiazide (HYZAAR) 100-12.5 MG tablet Take 1 tablet by mouth daily.    Marland Kitchen oxybutynin (DITROPAN) 5 MG tablet Take 5 mg by mouth 3 (three) times daily.    . sertraline (ZOLOFT) 100 MG tablet Take 100 mg by mouth daily.     No current facility-administered medications for this  visit.    Review of Systems  Constitutional: Positive for weight loss (3 lbs). Negative for chills, diaphoresis, fever and malaise/fatigue.       Feels "good."  HENT: Positive for hearing loss. Negative for congestion, ear discharge, ear pain, nosebleeds, sinus pain, sore throat and tinnitus.        Dry mouth.  Eyes: Negative for blurred vision.  Respiratory: Negative for cough, hemoptysis, sputum production and shortness of breath.        Sleep apnea. Uses CPAP.  Cardiovascular: Negative for chest pain, palpitations and leg swelling.  Gastrointestinal:  Negative for abdominal pain, blood in stool, constipation, diarrhea, heartburn, melena, nausea and vomiting.  Genitourinary: Negative for dysuria, frequency, hematuria and urgency.  Musculoskeletal: Positive for back pain (spinal stenosis), joint pain (right shoulder, gets injections) and myalgias (legs). Negative for neck pain.  Skin: Negative for itching and rash.  Neurological: Negative for dizziness, tingling, sensory change, weakness and headaches.  Endo/Heme/Allergies: Does not bruise/bleed easily.  Psychiatric/Behavioral: Negative for depression and memory loss. The patient is not nervous/anxious and does not have insomnia.   All other systems reviewed and are negative.  Performance status (ECOG): 1  Vitals Blood pressure 135/66, pulse (!) 57, temperature 98.2 F (36.8 C), temperature source Tympanic, resp. rate 16, height 5\' 11"  (1.803 m), weight 241 lb 10 oz (109.6 kg).   Physical Exam Vitals and nursing note reviewed.  Constitutional:      General: He is not in acute distress.    Appearance: He is not diaphoretic.  Eyes:     General: No scleral icterus. Neurological:     Mental Status: He is alert and oriented to person, place, and time.  Psychiatric:        Behavior: Behavior normal.        Thought Content: Thought content normal.        Judgment: Judgment normal.    No visits with results within 3 Day(s) from this visit.  Latest known visit with results is:  Appointment on 06/21/2020  Component Date Value Ref Range Status  . IgG (Immunoglobin G), Serum 06/21/2020 558* 603 - 1,613 mg/dL Final  . IgG, Subclass 1 06/21/2020 368  248 - 810 mg/dL Final  . IgG, Subclass 2 06/21/2020 106* 130 - 555 mg/dL Final  . IgG, Subclass 3 06/21/2020 16  15 - 102 mg/dL Final  . IgG, Subclass 4 06/21/2020 6  2 - 96 mg/dL Final   Comment: (NOTE) Performed At: [facility] [address] [place], [place] [id number] MD Jolene Schimke   . LDH 06/21/2020 117  98  - 192 U/L Final   Performed at [facility], [address] [place], [place]  . Sodium 06/21/2020 137  135 - 145 mmol/L Final  . Potassium 06/21/2020 3.9  3.5 - 5.1 mmol/L Final  . Chloride 06/21/2020 101  98 - 111 mmol/L Final  . CO2 06/21/2020 26  22 - 32 mmol/L Final  . Glucose, Bld 06/21/2020 100* 70 - 99 mg/dL Final   Glucose reference range applies only to samples taken after fasting for at least 8 hours.  . BUN 06/21/2020 28* 8 - 23 mg/dL Final  . Creatinine, Ser 06/21/2020 1.04  0.61 - 1.24 mg/dL Final  . Calcium 08/21/2020 9.4  8.9 - 10.3 mg/dL Final  . Total Protein 06/21/2020 6.8  6.5 - 8.1 g/dL Final  . Albumin 08/21/2020 4.4  3.5 - 5.0 g/dL Final  . AST 76/54/6503 19  15 - 41 U/L Final  . ALT 06/21/2020 25  0 - 44 U/L Final  . Alkaline Phosphatase 06/21/2020 41  38 - 126 U/L Final  . Total Bilirubin 06/21/2020 0.7  0.3 - 1.2 mg/dL Final  . GFR, Estimated 06/21/2020 >60  >60 mL/min Final   Comment: (NOTE) Calculated using the CKD-EPI Creatinine Equation (2021)   . Anion gap 06/21/2020 10  5 - 15 Final   Performed at [facility], [address] [place], [place] [address]  . IgG (Immunoglobin G), Serum 06/21/2020 559* 603 - 1,613 mg/dL Final  . IgA 60/45/409803/01/2021 81  61 - 437 mg/dL Final  . IgM (Immunoglobulin M), Srm 06/21/2020 <5* 20 - 172 mg/dL Final   Result confirmed on concentration.  . Total Protein ELP 06/21/2020 6.1  6.0 - 8.5 g/dL Corrected  . Albumin SerPl Elph-Mcnc 06/21/2020 3.8  2.9 - 4.4 g/dL Corrected  . Alpha 1 06/21/2020 0.1  0.0 - 0.4 g/dL Corrected  . Alpha2 Glob SerPl Elph-Mcnc 06/21/2020 0.8  0.4 - 1.0 g/dL Corrected  . B-Globulin SerPl Elph-Mcnc 06/21/2020 0.9  0.7 - 1.3 g/dL Corrected  . Gamma Glob SerPl Elph-Mcnc 06/21/2020 0.5  0.4 - 1.8 g/dL Corrected  . M Protein SerPl Elph-Mcnc 06/21/2020 Not Observed  Not Observed g/dL Corrected  . Globulin, Total 06/21/2020 2.3  2.2 - 3.9 g/dL Corrected  . Albumin/Glob  SerPl 06/21/2020 1.7  0.7 - 1.7 Corrected  . IFE 1 06/21/2020 Comment*  Corrected   Comment: (NOTE) All immunoglobulins appear decreased. Pattern suggestive of hypogammaglobulinemia.   . Please Note 06/21/2020 Comment   Corrected   Comment: (NOTE) Protein electrophoresis scan will follow via computer, mail, or courier delivery. Performed At: [facility] [address] [place], [place] [id number] Jolene SchimkeNagendra Sanjai MD FA:[id number]:[phone]   . Color, Urine 06/21/2020 YELLOW  YELLOW Final  . APPearance 06/21/2020 CLEAR  CLEAR Final  . Specific Gravity, Urine 06/21/2020 1.010  1.005 - 1.030 Final  . pH 06/21/2020 5.5  5.0 - 8.0 Final  . Glucose, UA 06/21/2020 NEGATIVE  NEGATIVE mg/dL Final  . Hgb urine dipstick 06/21/2020 NEGATIVE  NEGATIVE Final  . Bilirubin Urine 06/21/2020 NEGATIVE  NEGATIVE Final  . Ketones, ur 06/21/2020 NEGATIVE  NEGATIVE mg/dL Final  . Protein, ur 69/62/952803/01/2021 NEGATIVE  NEGATIVE mg/dL Final  . Nitrite 41/32/440103/01/2021 NEGATIVE  NEGATIVE Final  . Leukocytes,Ua 06/21/2020 NEGATIVE  NEGATIVE Final  . Squamous Epithelial / LPF 06/21/2020 NONE SEEN  0 - 5 Final  . WBC, UA 06/21/2020 0-5  0 - 5 WBC/hpf Final  . RBC / HPF 06/21/2020 NONE SEEN  0 - 5 RBC/hpf Final  . Bacteria, UA 06/21/2020 NONE SEEN  NONE SEEN Final  . Mucus 06/21/2020 PRESENT   Final   Performed at [facility] [facility], [address] [place], [place] [address]  . Path Review 06/21/2020 Blood smear is reviewed.   Final   Comment: Normal WBC count and differential. Normocytic erythrocytes, borderline anemia, without significant anisopoikilocytosis. Normal platelet count and morphology. Reviewed by Beryle QuantHeath M. Jones, M.D. Performed at [facility], [address] [place], [place] [address]   . WBC 06/21/2020 8.0  4.0 - 10.5 K/uL Final  . RBC 06/21/2020 3.86* 4.22 - 5.81 MIL/uL Final  . Hemoglobin 06/21/2020 12.9* 13.0 - 17.0 g/dL Final  . HCT 03/47/425903/01/2021 37.1* 39.0 - 52.0 % Final  . MCV  06/21/2020 96.1  80.0 - 100.0 fL Final  . MCH 06/21/2020 33.4  26.0 - 34.0  pg Final  . MCHC 06/21/2020 34.8  30.0 - 36.0 g/dL Final  . RDW 57/32/2025 12.1  11.5 - 15.5 % Final  . Platelets 06/21/2020 273  150 - 400 K/uL Final  . nRBC 06/21/2020 0.0  0.0 - 0.2 % Final  . Neutrophils Relative % 06/21/2020 71  % Final  . Neutro Abs 06/21/2020 5.7  1.7 - 7.7 K/uL Final  . Lymphocytes Relative 06/21/2020 18  % Final  . Lymphs Abs 06/21/2020 1.4  0.7 - 4.0 K/uL Final  . Monocytes Relative 06/21/2020 8  % Final  . Monocytes Absolute 06/21/2020 0.6  0.1 - 1.0 K/uL Final  . Eosinophils Relative 06/21/2020 2  % Final  . Eosinophils Absolute 06/21/2020 0.2  0.0 - 0.5 K/uL Final  . Basophils Relative 06/21/2020 1  % Final  . Basophils Absolute 06/21/2020 0.1  0.0 - 0.1 K/uL Final  . Immature Granulocytes 06/21/2020 0  % Final  . Abs Immature Granulocytes 06/21/2020 0.03  0.00 - 0.07 K/uL Final   Performed at [facility], [address] [place], [place] [address]    Assessment:  Donald Lee is a 70 y.o. 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  He has a previous history of lumbar surgery and epidural steroid injections. He notes sinus infections every 1-1.5 years.  Labs on 04/20/2020 revealed an SPEP with an IgG of 503 (237-6283) and an IgM of <5 (20-172). 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 Sed rate was 4.  ANA and HIV testing were negative.  Work-up on 06/21/2020 revealed a hematocrit of 37.1, hemoglobin 12.9, MCV 96.1, platelets 273,000, WBC 8,000. Serum IgG was 558 (IgG2 was 106). LDH was 117. Urinalysis revealed no protein. M spike was 0.  Peripheral smear revealed normal WBC count and differential. There were normocytic erythrocytes, borderline anemia, without significant anisopoikilocytosis. Platelet count and morphology were normal.  The patient received the Moderna COVID-19 vaccine on  06/27/2019 and 07/11/2019. He received the [employer] on 03/20/2020.  Symptomatically, he has chronic back and leg pain s/p back surgery.  He denies any new symptoms.  Plan: 1.   Review work-up. 2.   Hypogammaglobulinemia  He has mild hypogammaglobulinemia (IgG 503-558).  He has recurrent sinus infections every 1-1.5 years without other infections.  He has no current evidence of CLL or lymphoma - palpable adenopathy or hepatosplenomegaly.   Peripheral smear reveals no evidence of CLL.   Discuss consideration of CT scans if concern for lymphoma.   No current evidence of a monoclonal gammopathy or free light chain disease.    Consider FLCA and collection of 24 hour urine for UPEP or free light chains (felt low yield).  He has no evidence of enteropathies (GI or GU).   He has no diarrhea or proteinuria on screening urinalysis.  Discuss consideration of immunology consultation.   Patient has an IgG2 deficiency.  IgG2 associated with lupus, Sjogren's, vasculitis, HIV, Hodgkin's.   Patients typically are at increased risk for sinusitis, otitis media, and upper respiratory tract infections.   ANA and HIV testing were negative.  Patient has no sicca symptoms.  Discuss referral to immunology.   Patient in agreement. 3.   RN: Immunology referral. 4.   RTC prn.  I discussed the assessment and treatment plan with the patient.  The patient was provided an opportunity to ask questions and all were answered.  The patient agreed with the plan and demonstrated an understanding  of the instructions.  The patient was advised to call back if the symptoms worsen or if the condition fails to improve as anticipated.  I provided 14 minutes of face-to-face time during this this encounter and > 50% was spent counseling as documented under my assessment and plan.   An additional 10 minutes were spent reviewing his chart (Epic and Care Everywhere) including notes, labs, and imaging studies.  [facility] was  contacted.   Raynisha Avilla C. Merlene Pulling, MD, PhD    07/05/2020, 12:09 PM  I, Danella Penton Tufford, am acting as Neurosurgeon for [employer]. Merlene Pulling, MD, PhD.  I, Koby Pickup C. Merlene Pulling, MD, have reviewed the above documentation for accuracy and completeness, and I agree with the above.

## 2020-07-04 ENCOUNTER — Ambulatory Visit: Payer: Medicare Other | Admitting: Internal Medicine

## 2020-07-05 ENCOUNTER — Other Ambulatory Visit: Payer: Self-pay

## 2020-07-05 ENCOUNTER — Encounter: Payer: Self-pay | Admitting: Hematology and Oncology

## 2020-07-05 ENCOUNTER — Inpatient Hospital Stay (HOSPITAL_BASED_OUTPATIENT_CLINIC_OR_DEPARTMENT_OTHER): Payer: Medicare Other | Admitting: Hematology and Oncology

## 2020-07-05 VITALS — BP 135/66 | HR 57 | Temp 98.2°F | Resp 16 | Ht 71.0 in | Wt 241.6 lb

## 2020-07-05 DIAGNOSIS — D801 Nonfamilial hypogammaglobulinemia: Secondary | ICD-10-CM

## 2020-07-05 DIAGNOSIS — D803 Selective deficiency of immunoglobulin G [IgG] subclasses: Secondary | ICD-10-CM | POA: Insufficient documentation

## 2020-07-05 NOTE — Progress Notes (Signed)
Pt here to get info about what he has and what is the plan. Has some leg and back pain.

## 2020-07-09 ENCOUNTER — Telehealth: Payer: Self-pay | Admitting: *Deleted

## 2020-07-09 NOTE — Telephone Encounter (Signed)
Pt has called back requesting to know when his appointment will be scheduled I informed him that it was being worked on and someone would contact him as soon as we have the specialist information.

## 2020-07-09 NOTE — Telephone Encounter (Signed)
Spoke with patient. Patient reassured that Dr. Merlene Pulling is waiting to communicate to the providers at [facility] to determine who to send his referral. Pt thanked me for updating him. I reassured the patient that I would call him as soon as we know more.

## 2020-07-09 NOTE — Telephone Encounter (Signed)
Dr. Salena Saner- per Sherry,RN's hand off you were to let us know what provider to refer patient to.

## 2020-07-09 NOTE — Telephone Encounter (Signed)
Patient called asking if an appointment with a specialist has been made yet. He has not heard anything about it yet

## 2020-07-10 NOTE — Telephone Encounter (Signed)
Please let me know what [facility] say. Thanks.

## 2020-07-11 ENCOUNTER — Other Ambulatory Visit: Payer: Self-pay | Admitting: *Deleted

## 2020-07-11 ENCOUNTER — Encounter: Payer: Self-pay | Admitting: *Deleted

## 2020-07-11 DIAGNOSIS — D803 Selective deficiency of immunoglobulin G [IgG] subclasses: Secondary | ICD-10-CM

## 2020-07-11 DIAGNOSIS — D801 Nonfamilial hypogammaglobulinemia: Secondary | ICD-10-CM

## 2020-07-11 NOTE — Telephone Encounter (Signed)
Referral faxed to Dr. Quin Hoop at [facility] allergy/immunology per v/o Dr. Merlene Pulling.

## 2020-08-01 ENCOUNTER — Other Ambulatory Visit: Payer: Self-pay | Admitting: Neurology

## 2020-08-01 DIAGNOSIS — R9389 Abnormal findings on diagnostic imaging of other specified body structures: Secondary | ICD-10-CM

## 2020-08-09 ENCOUNTER — Ambulatory Visit
Admission: RE | Admit: 2020-08-09 | Discharge: 2020-08-09 | Disposition: A | Discharge status: Home / Self Care | Payer: Medicare Other | Source: Ambulatory Visit | Attending: Neurology | Admitting: Neurology

## 2020-08-09 ENCOUNTER — Other Ambulatory Visit: Payer: Self-pay

## 2020-08-09 DIAGNOSIS — R9389 Abnormal findings on diagnostic imaging of other specified body structures: Secondary | ICD-10-CM | POA: Insufficient documentation

## 2020-08-09 IMAGING — MR MR LUMBAR SPINE WO/W CM
4 of 6 series · 31 of 48 positions shown · IV contrast (gadavist)
Comparison: Lumbar MRI [DATE]

CLINICAL DATA: Back pain.  Prior back surgery.

EXAM:
MRI LUMBAR SPINE WITHOUT AND WITH CONTRAST
TECHNIQUE: Multiplanar and multiecho pulse sequences of the lumbar spine were
obtained without and with intravenous contrast.
CONTRAST:  10mL GADAVIST GADOBUTROL 1 MMOL/ML IV SOLN

[Series 5: T2 · sagittal · 4.0mm · 0.81mm/px · 5 of 17 slices shown (1 of 2)]
[im 1/17]
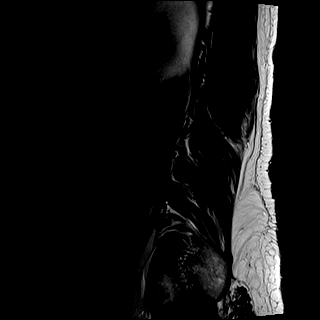
[im 5/17]
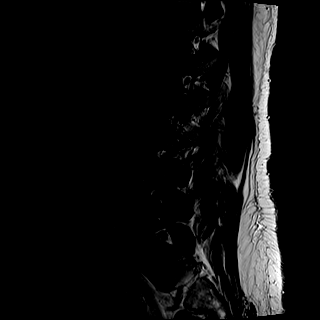
[im 9/17]
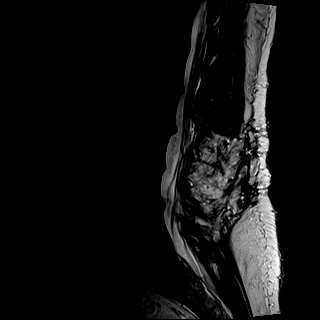
[im 13/17]
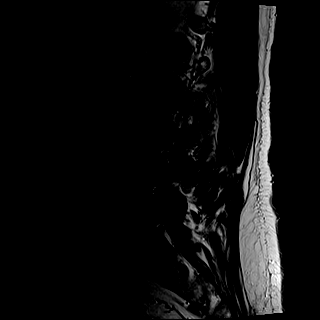
[im 17/17]
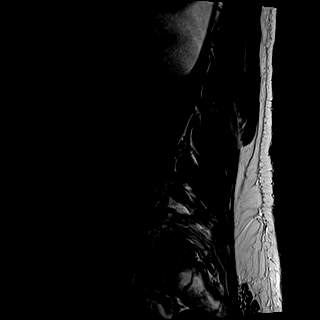

[Series 8: T2 · axial · 4.0mm · 0.78mm/px · z∈[-54,+165]mm · 11 of 38 slices shown (2 of 2)]
[im 1/38]
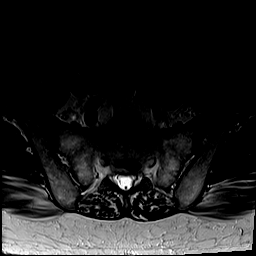
[im 4/38]
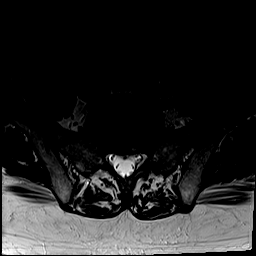
[im 8/38]
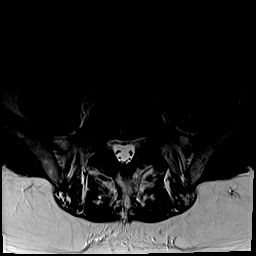
[im 12/38]
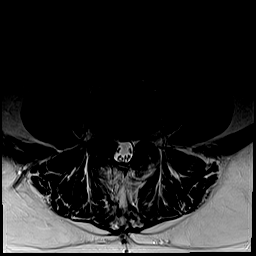
[im 15/38]
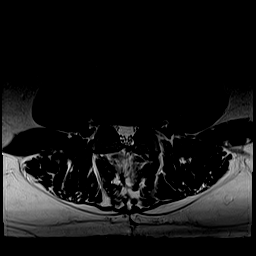
[im 19/38]
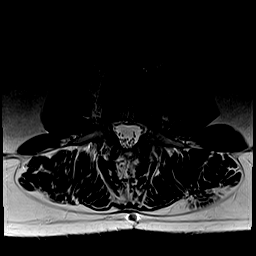
[im 23/38]
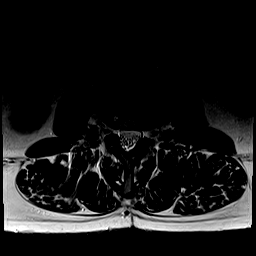
[im 26/38]
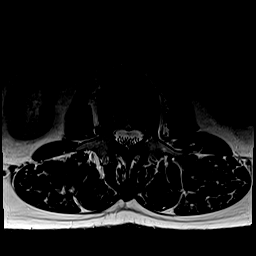
[im 30/38]
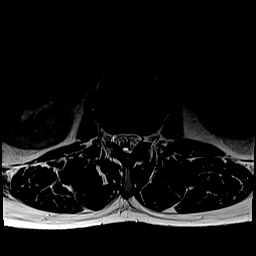
[im 34/38]
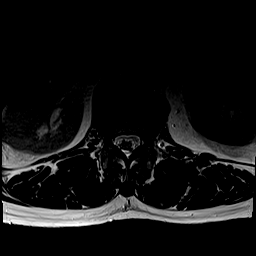
[im 38/38]
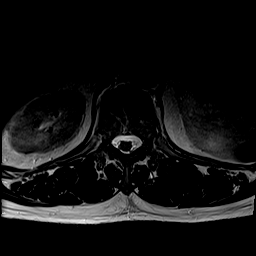

[Series 9: T1 · axial · 4.0mm · 0.39mm/px · z∈[-54,+165]mm · 11 of 38 slices shown]
[im 1/38]
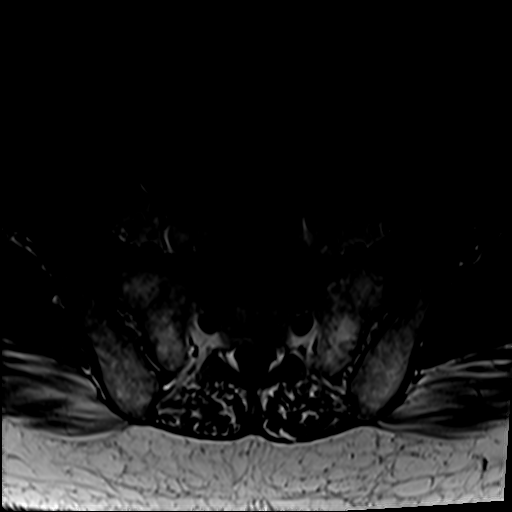
[im 4/38]
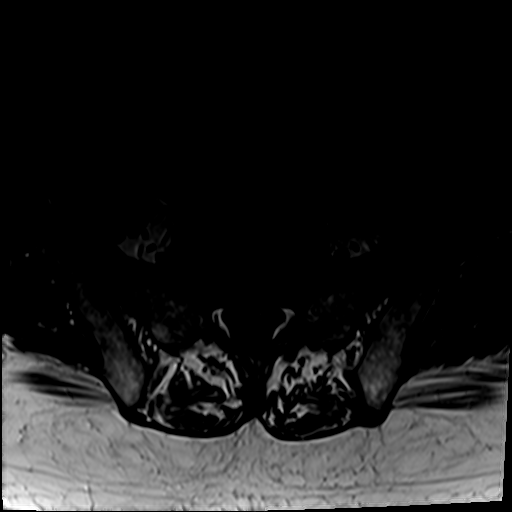
[im 8/38]
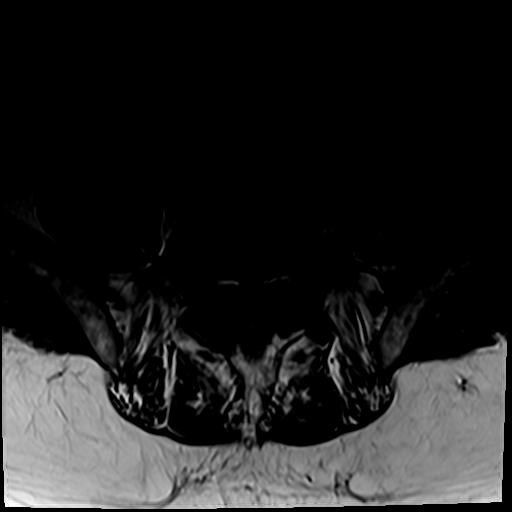
[im 12/38]
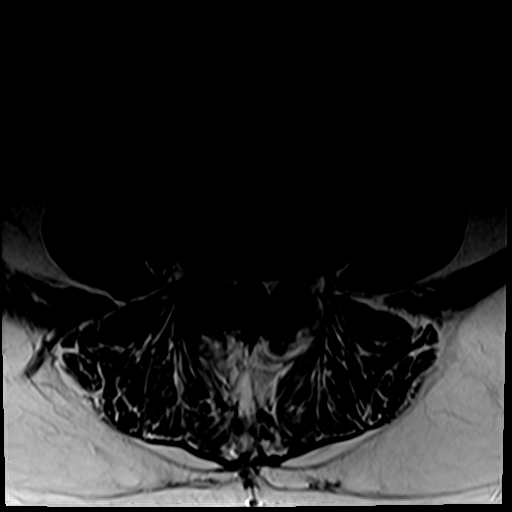
[im 15/38]
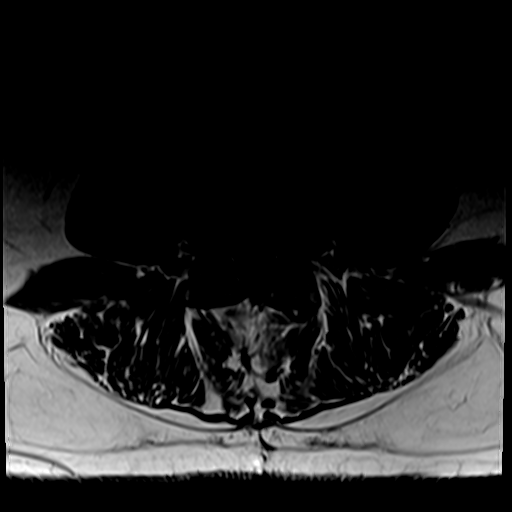
[im 19/38]
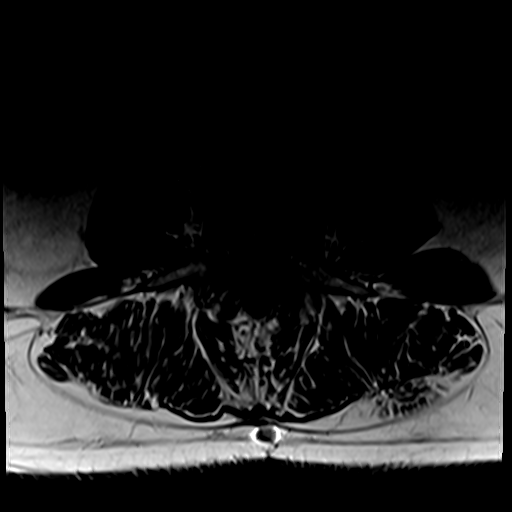
[im 23/38]
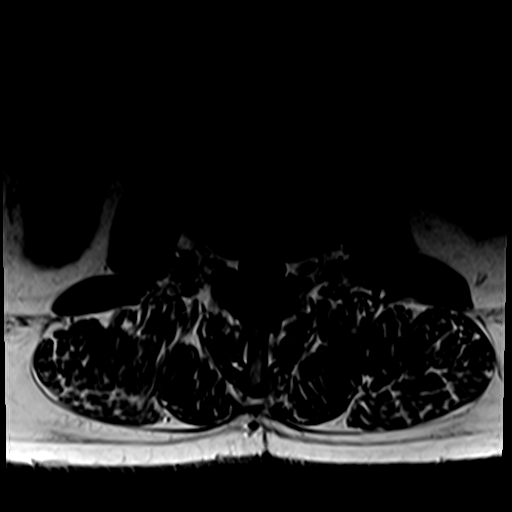
[im 26/38]
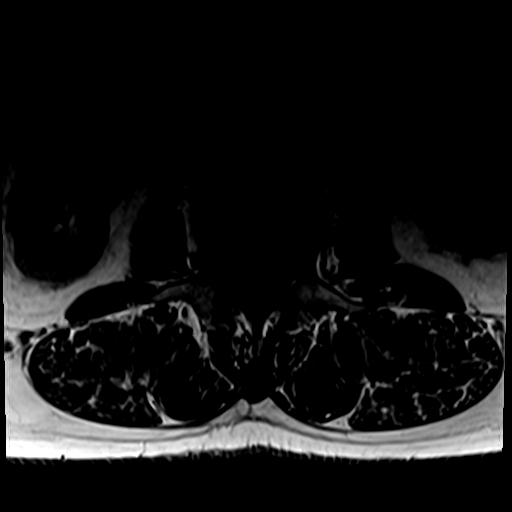
[im 30/38]
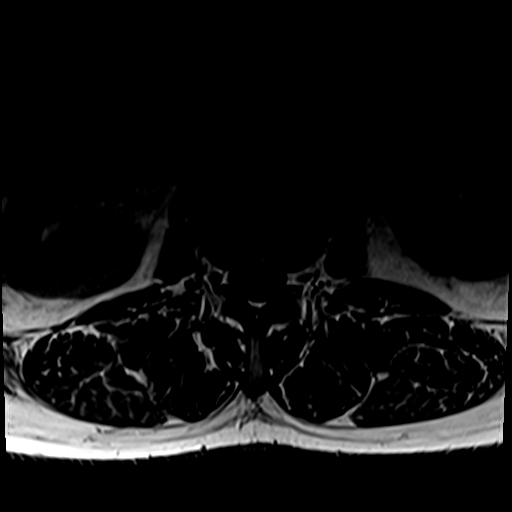
[im 34/38]
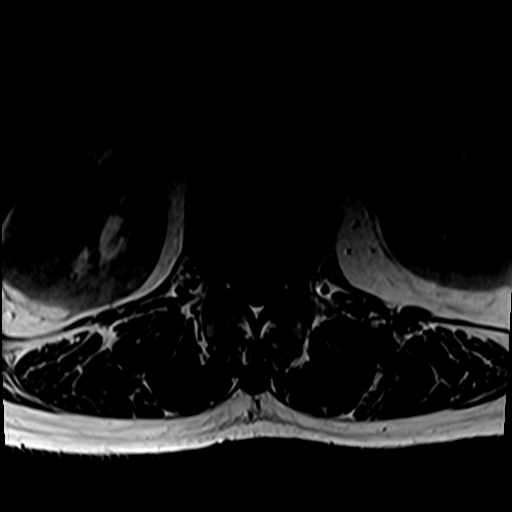
[im 38/38]
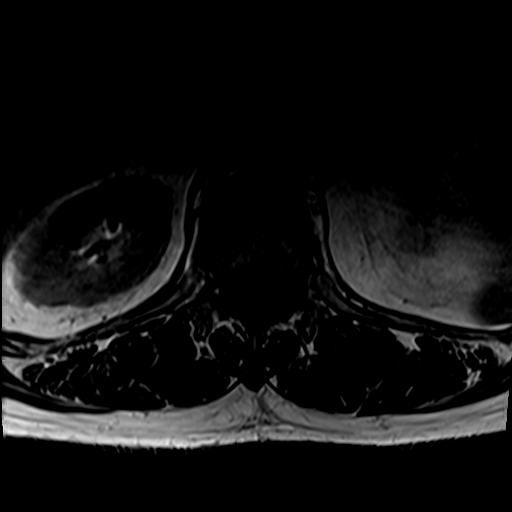

[Series 10: T1 fat-sat post-contrast · sagittal · 4.0mm · 0.81mm/px · 4 of 17 slices shown]
[im 1/17]
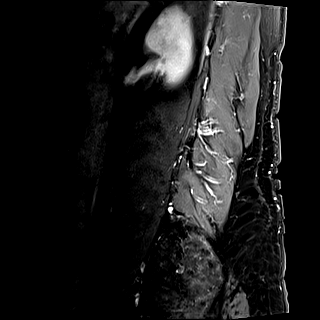
[im 5/17]
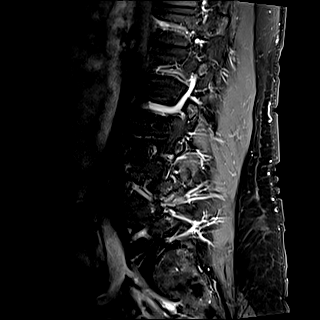
[im 9/17]
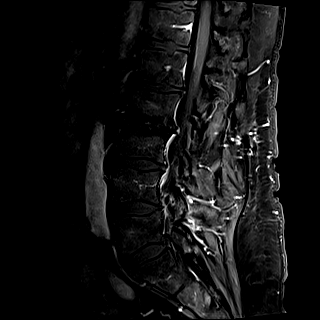
[im 17/17]
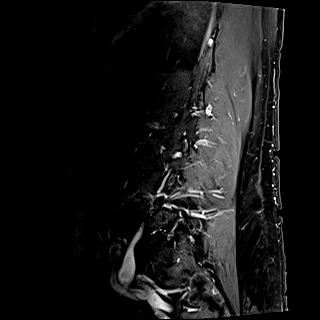

[31 of 48 positions shown; findings below may reference images not displayed]

FINDINGS: Segmentation:  Normal

Alignment:  Slight retrolisthesis L2-3 otherwise normal alignment.

Vertebrae:  Negative for fracture or mass.

Conus medullaris and cauda equina: Conus extends to the L1-2 level.
Normal conus medullaris.

There is diffuse and extensive enhancement of the left S1 nerve root
similar to the prior study. No mass or nodularity associated with
the enhancement. Enhancement begins in the distal spinal cord and
extends into the neural foramen. No interval change. No other nerve
root enhancement identified.

Paraspinal and other soft tissues: Negative for paraspinous mass or
adenopathy.

Disc levels:

T12-L1: Mild disc and facet degeneration.  Negative for stenosis

L1-2: Disc degeneration with disc bulging and small central disc
protrusion. Bilateral facet degeneration. Mild spinal stenosis. Mild
subarticular stenosis left greater than right

L2-3: Advanced disc degeneration with disc space narrowing and
diffuse endplate spurring. Posterior laminectomy with adequate canal
decompression. Mild to moderate subarticular stenosis bilaterally
unchanged.

L3-4: Posterior laminectomy with decompression of the canal. There
is diffuse disc bulging and endplate spurring. Bilateral mild facet
degeneration. Moderate subarticular stenosis bilaterally without
interval change.

L4-5: Decompressive laminectomy. Diffuse bulging of the disc and
endplate spurring. Bilateral facet hypertrophy, asymmetric on the
right. Broad-based disc protrusion has progressed in the interval.
Moderate to severe right subarticular stenosis and foraminal
stenosis which has progressed. Moderate left subarticular stenosis
with progression.

L5-S1: Mild disc and facet degeneration.  No significant stenosis.
IMPRESSION: Persistent enhancement of the left S1 nerve root beginning the
spinal cord extending to the foramen, unchanged from prior study.
This most likely due to neuritis. No nodularity or masslike
features.

Multilevel degenerative change throughout the lumbar spine.
Progressive disc protrusion L4-5. Progressive subarticular and
foraminal stenosis bilaterally right greater than left.

## 2020-08-09 MED ORDER — GADOBUTROL 1 MMOL/ML IV SOLN
10.0000 mL | Freq: Once | INTRAVENOUS | Status: AC | PRN
  Administered 2020-08-09: 10 mL via INTRAVENOUS

## 2021-03-24 ENCOUNTER — Ambulatory Visit
Admission: EM | Admit: 2021-03-24 | Discharge: 2021-03-24 | Disposition: A | Discharge status: Home / Self Care | Payer: Medicare Other | Attending: Medical Oncology | Admitting: Medical Oncology

## 2021-03-24 ENCOUNTER — Other Ambulatory Visit: Payer: Self-pay

## 2021-03-24 ENCOUNTER — Encounter: Payer: Self-pay | Admitting: Emergency Medicine

## 2021-03-24 DIAGNOSIS — L03114 Cellulitis of left upper limb: Secondary | ICD-10-CM

## 2021-03-24 MED ORDER — AMOXICILLIN-POT CLAVULANATE 875-125 MG PO TABS: 1.0000 | ORAL_TABLET | Freq: Two times a day (BID) | ORAL | 0 refills | Status: DC

## 2021-03-24 NOTE — ED Provider Notes (Signed)
[facility]    CSN: [id number] Arrival date & time: 03/24/21  1336      History   Chief Complaint Chief Complaint  Patient presents with   Hand Pain    left    HPI Lenin Icenhower is a 70 y.o. male.   HPI  Hand Pain: Patient reports that for the past roughly 3 days he has had some puffiness and redness of his left dorsal hand.  He has a few cat scratches on this area along with 1 cat bite that is very superficial in nature that occurred on his forearm 3 days ago.  He denies any significant progression, fever, lymphadenopathy.  He has tried Neosporin for symptoms without much improvement.  He is unsure when his last Tdap was.  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/21/2020   Obesity (BMI 30-39.9) 04/24/2020   OSA on CPAP 04/17/2020   CPAP use counseling 04/17/2020   Depressive disorder 05/19/2018   Dyslipidemia 05/19/2018   Hypothyroidism 05/19/2018   Neck pain 05/19/2018   Class 1 obesity with serious comorbidity and body mass index (BMI) of 34.0 to 34.9 in adult 06/08/2017   Spinal stenosis of lumbar region with neurogenic claudication 09/24/2016   Incomplete emptying of bladder 02/17/2016   Primary osteoarthritis of left foot 10/11/2015   Recurrent major depressive disorder, in remission ([place]) 10/11/2015   Hyperlipidemia 03/24/2014   Hypertension 03/24/2014   Postoperative hypothyroidism 03/24/2014   Benign localized hyperplasia of prostate with urinary obstruction 12/01/2012   Nocturia 12/01/2012    Slowing of urinary stream 12/01/2012    Past Surgical History:  Procedure Laterality Date   APPENDECTOMY     BACK SURGERY     FOOT SURGERY     HERNIA REPAIR     THYROIDECTOMY, PARTIAL     VASECTOMY         Home Medications    Prior to Admission medications   Medication Sig Start Date End Date Taking? Authorizing Provider  atorvastatin (LIPITOR) 20 MG tablet Take 20 mg by mouth daily. 04/19/18  Yes [provider]  buPROPion (WELLBUTRIN) 75 MG tablet Take 75 mg by mouth 2 (two) times daily. 07/26/18  Yes [provider]  diclofenac (CATAFLAM) 50 MG tablet Take 50 mg by mouth 3 (three) times daily. 06/09/20  Yes [provider]  gabapentin (NEURONTIN) 300 MG capsule Take 300 mg by mouth 3 (three) times daily.   Yes [provider]  levothyroxine (SYNTHROID, LEVOTHROID) 125 MCG tablet 112 mcg. Two tablets twice daily  05/18/18  Yes [provider]  losartan-hydrochlorothiazide (HYZAAR) 100-12.5 MG tablet Take 1 tablet by mouth daily. 05/18/18  Yes [provider]  oxybutynin (DITROPAN) 5 MG tablet Take 5 mg by mouth 3 (three) times daily.   Yes [provider]  sertraline (ZOLOFT) 100 MG tablet Take 100 mg by mouth daily.   Yes [provider]    Family History Family History  Problem Relation Age of Onset  Pancreatic cancer Mother    Liver cancer Mother     Social History Social History   Tobacco Use   Smoking status: Light Smoker    Types: Cigars   Smokeless tobacco: Never   Tobacco comments:    5 cigars a week  Vaping Use   Vaping Use: Never used  Substance Use Topics   Alcohol use: Yes    Comment: rare   Drug use: Never     Allergies   Patient has no known allergies.   Review of Systems Review of Systems As stated above in HPI  Physical Exam Triage Vital Signs ED Triage Vitals  Enc Vitals Group     BP 03/24/21 1416 118/67     Pulse Rate 03/24/21 1416 64     Resp 03/24/21 1416 15      Temp 03/24/21 1416 98.1 F (36.7 C)     Temp Source 03/24/21 1416 Oral     SpO2 03/24/21 1416 96 %     Weight 03/24/21 1414 232 lb (105.2 kg)     Height 03/24/21 1414 5\' 11"  (1.803 m)     Head Circumference --      Peak Flow --      Pain Score 03/24/21 1413 6     Pain Loc --      Pain Edu? --      Excl. in GC? --    No data found.  Updated Vital Signs BP 118/67 (BP Location: Right Arm)   Pulse 64   Temp 98.1 F (36.7 C) (Oral)   Resp 15   Ht 5\' 11"  (1.803 m)   Wt 232 lb (105.2 kg)   SpO2 96%   BMI 32.36 kg/m   Physical Exam Vitals and nursing note reviewed.  Constitutional:      General: He is not in acute distress.    Appearance: Normal appearance. He is not ill-appearing, toxic-appearing or diaphoretic.  HENT:     Head: Normocephalic and atraumatic.  Cardiovascular:     Rate and Rhythm: Normal rate and regular rhythm.     Heart sounds: Normal heart sounds.  Pulmonary:     Effort: Pulmonary effort is normal.     Breath sounds: Normal breath sounds.  Lymphadenopathy:     Cervical: No cervical adenopathy.  Skin:    General: Skin is warm.     Comments: There is mild edema, mild erythema and diffuse resolving scratches of the left dorsal hand.  No trailing erythema or joint stiffness.  Neurological:     Mental Status: He is alert and oriented to person, place, and time.     UC Treatments / Results  Labs (all labs ordered are listed, but only abnormal results are displayed) Labs Reviewed - No data to display  EKG   Radiology No results found.  Procedures Procedures (including critical care time)  Medications Ordered in UC Medications - No data to display  Initial Impression / Assessment and Plan / UC Course  I have reviewed the triage vital signs and the nursing notes.  Pertinent labs & imaging results that were available during my care of the patient were reviewed by me and considered in my medical decision making (see chart for details).      New.  Treating with Augmentin to cover for cat bite.  Tdap at pharmacy.  We discussed how to use these medications along with common potential side effects and precautions.  We reviewed red flag signs and symptoms.  Follow-up as  needed Final Clinical Impressions(s) / UC Diagnoses   Final diagnoses:  None   Discharge Instructions   None    ED Prescriptions   None    PDMP not reviewed this encounter.   Hughie Closs, [place] 03/24/21 1538

## 2021-03-24 NOTE — ED Triage Notes (Signed)
Patient c/o left hand redness, swelling, and pain that started 2-3 days ago.  Patient denies injury or fall.

## 2021-05-31 ENCOUNTER — Other Ambulatory Visit (HOSPITAL_COMMUNITY): Payer: Self-pay | Admitting: Physical Medicine & Rehabilitation

## 2021-05-31 ENCOUNTER — Other Ambulatory Visit: Payer: Self-pay | Admitting: Physical Medicine & Rehabilitation

## 2021-05-31 DIAGNOSIS — G8929 Other chronic pain: Secondary | ICD-10-CM

## 2021-05-31 DIAGNOSIS — M48062 Spinal stenosis, lumbar region with neurogenic claudication: Secondary | ICD-10-CM

## 2021-06-08 ENCOUNTER — Ambulatory Visit
Admission: RE | Admit: 2021-06-08 | Discharge: 2021-06-08 | Disposition: A | Discharge status: Home / Self Care | Payer: Medicare Other | Source: Ambulatory Visit | Attending: Physical Medicine & Rehabilitation | Admitting: Physical Medicine & Rehabilitation

## 2021-06-08 ENCOUNTER — Other Ambulatory Visit: Payer: Self-pay

## 2021-06-08 DIAGNOSIS — M5441 Lumbago with sciatica, right side: Secondary | ICD-10-CM | POA: Insufficient documentation

## 2021-06-08 DIAGNOSIS — G8929 Other chronic pain: Secondary | ICD-10-CM | POA: Diagnosis present

## 2021-06-08 DIAGNOSIS — M48062 Spinal stenosis, lumbar region with neurogenic claudication: Secondary | ICD-10-CM | POA: Diagnosis present

## 2021-06-08 DIAGNOSIS — M5442 Lumbago with sciatica, left side: Secondary | ICD-10-CM | POA: Diagnosis present

## 2021-06-08 IMAGING — MR MR LUMBAR SPINE W/O CM
5 series · 31 of 48 positions shown · non-contrast
Comparison: [DATE]

CLINICAL DATA: Increasing low back pain since prior exam in [facility].

EXAM:
MRI LUMBAR SPINE WITHOUT CONTRAST
TECHNIQUE: Multiplanar, multisequence MR imaging of the lumbar spine was
performed. No intravenous contrast was administered.

[Series 5: T2 · sagittal · 4.0mm · 0.81mm/px · 7 of 16 slices shown (1 of 2)]
[im 1/16]
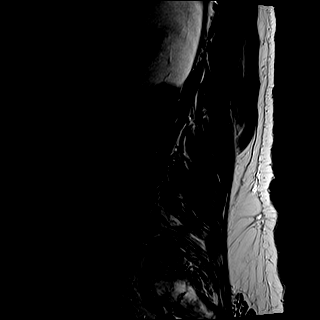
[im 3/16]
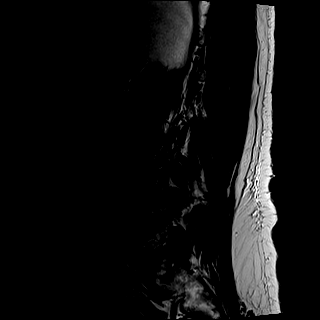
[im 6/16]
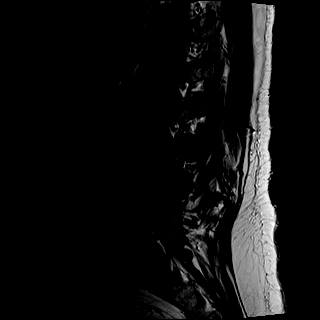
[im 8/16]
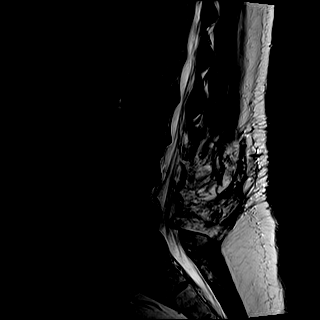
[im 11/16]
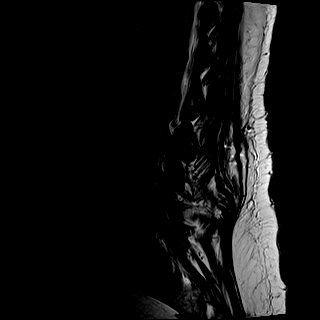
[im 13/16]
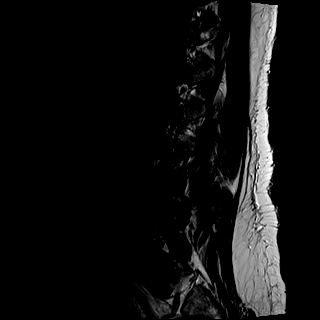
[im 16/16]
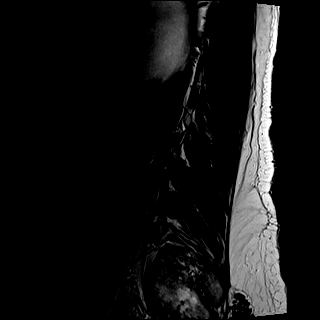

[Series 6: T1 · sagittal · 4.0mm · 0.81mm/px · 7 of 16 slices shown (1 of 2)]
[im 1/16]
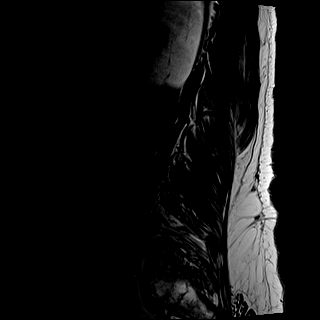
[im 3/16]
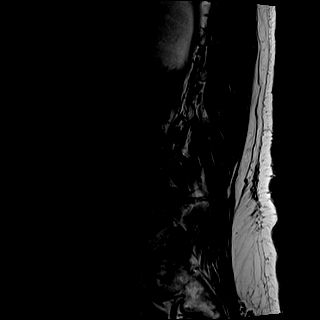
[im 6/16]
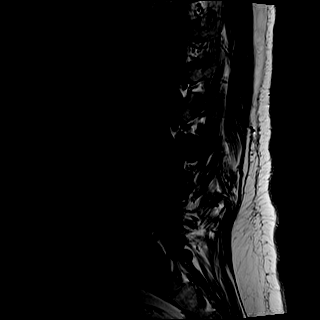
[im 8/16]
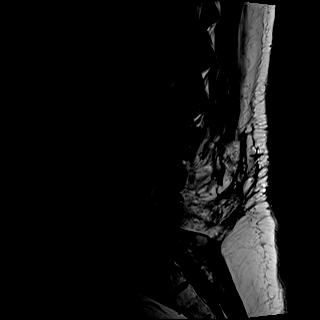
[im 11/16]
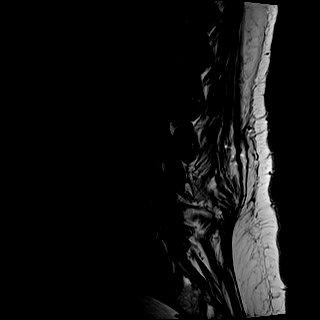
[im 13/16]
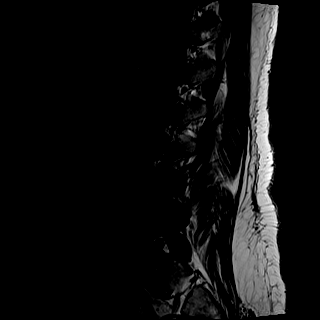
[im 16/16]
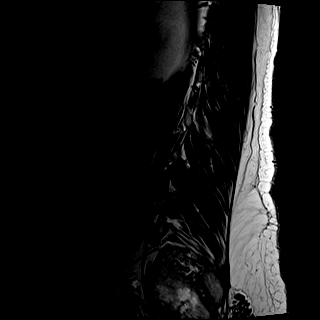

[Series 7: STIR · sagittal · 4.0mm · 0.41mm/px · 1 of 16 slices shown]
[im 1/16]
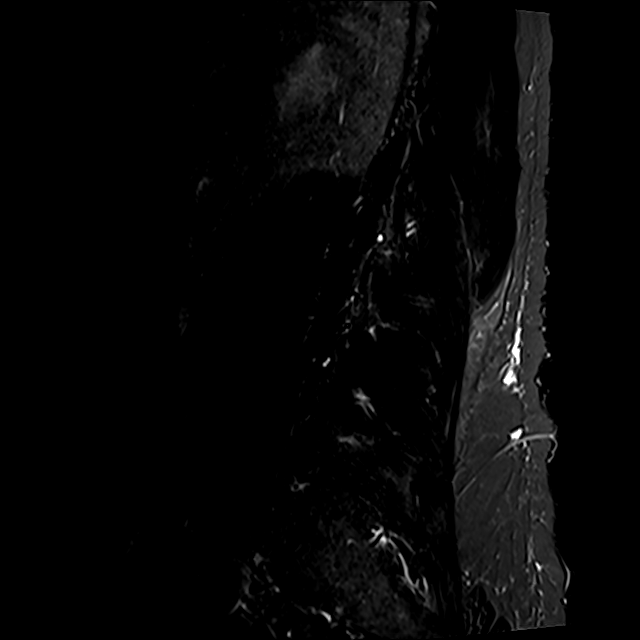

[Series 8: T2 · axial · 4.0mm · 0.78mm/px · z∈[-128,+86]mm · 8 of 36 slices shown (2 of 2)]
[im 1/36]
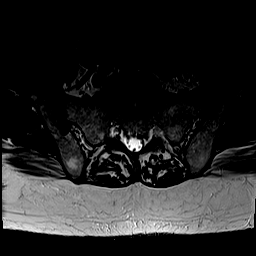
[im 6/36]
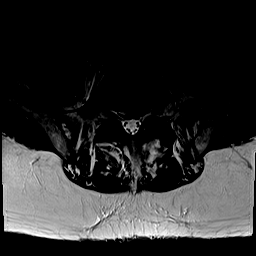
[im 11/36]
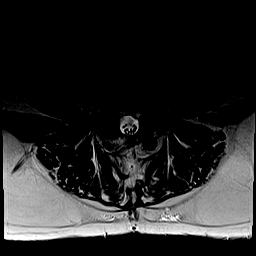
[im 17/36]
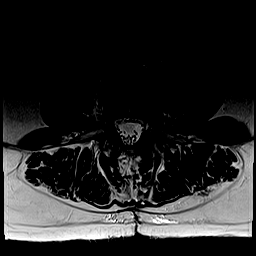
[im 19/36]
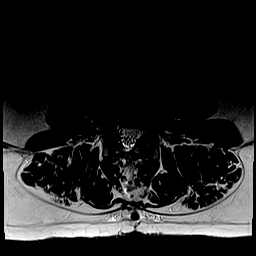
[im 25/36]
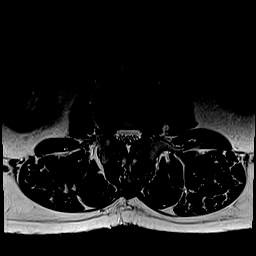
[im 30/36]
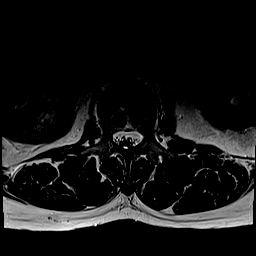
[im 36/36]
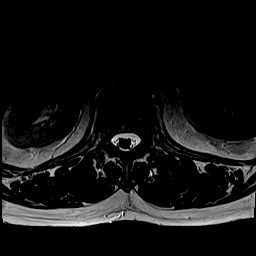

[Series 9: T1 · axial · 4.0mm · 0.39mm/px · z∈[-128,+86]mm · 8 of 36 slices shown (2 of 2)]
[im 1/36]
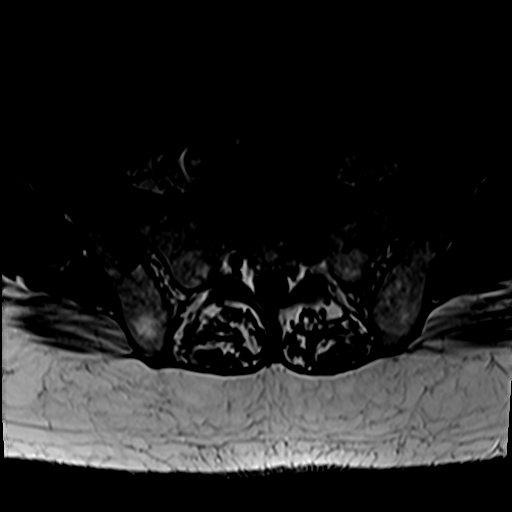
[im 6/36]
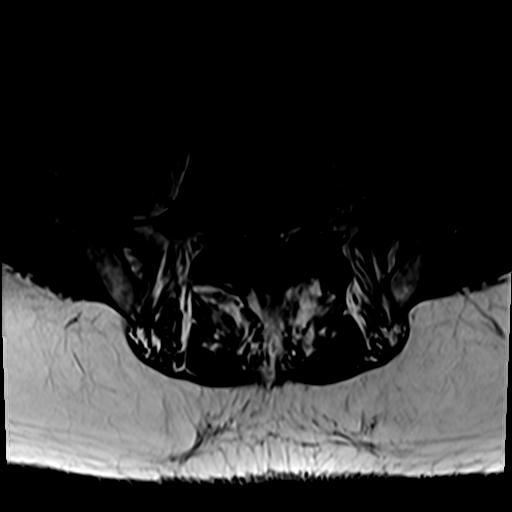
[im 11/36]
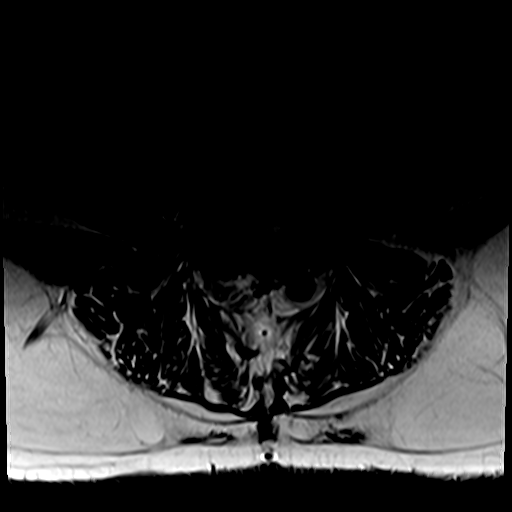
[im 17/36]
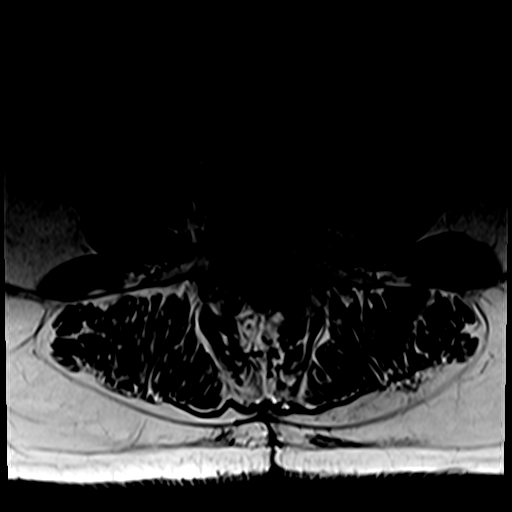
[im 19/36]
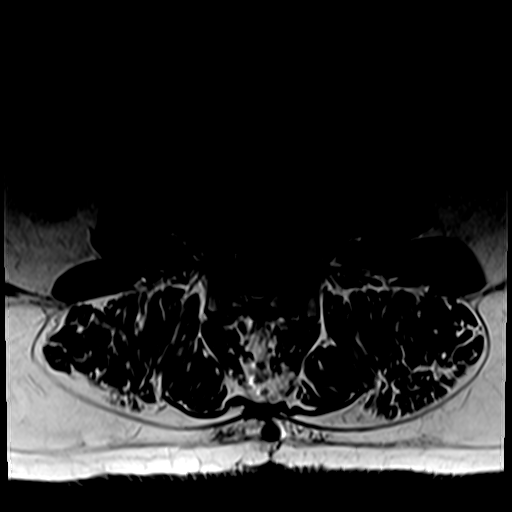
[im 25/36]
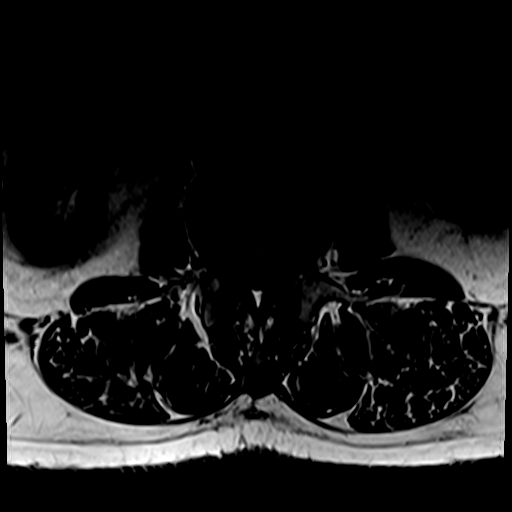
[im 30/36]
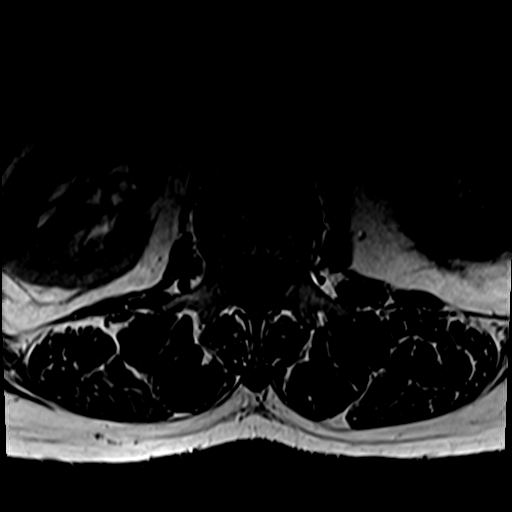
[im 36/36]
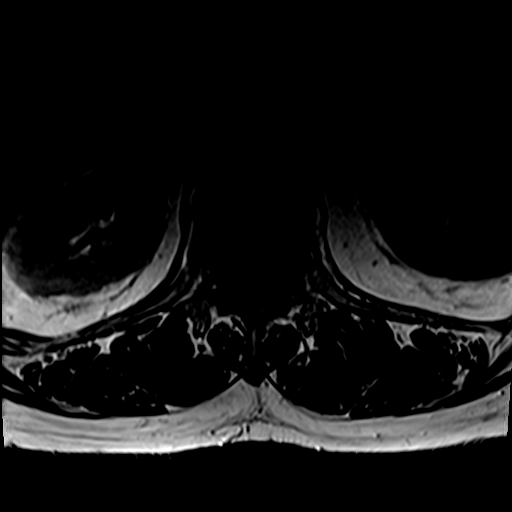

[31 of 48 positions shown; findings below may reference images not displayed]

FINDINGS: Segmentation:  Standard.

Alignment:  Minimal grade 1 anterolisthesis of L4 on L5.

Vertebrae: No acute fracture, evidence of discitis, or aggressive
bone lesion.

Conus medullaris and cauda equina: Conus extends to the L1 level.
Conus and cauda equina appear normal.

Paraspinal and other soft tissues: No acute paraspinal abnormality.

Disc levels:

Disc spaces: Degenerative disease with mild disc height loss at
L1-2. Degenerative disease with severe disc height loss at L2-3.
Disc desiccation at L3-4 and L4-5.

T12-L1: No significant disc bulge. No neural foraminal stenosis. No
central canal stenosis.

L1-L2: Mild broad-based disc bulge with a tiny central disc
protrusion. Mild bilateral facet arthropathy. No foraminal or
central canal stenosis.

L2-L3: Broad-based disc bulge flattening ventral thecal sac. Mild
bilateral facet arthropathy. Prior laminectomy. Bilateral
subarticular recess stenosis. No spinal stenosis. No foraminal
stenosis.

L3-L4: Broad-based disc bulge. Prior laminectomy. No spinal
stenosis. Moderate bilateral facet arthropathy. Mild bilateral
foraminal stenosis. Bilateral subarticular recess stenosis.

L4-L5: Broad-based disc bulge. Prior laminectomy. Moderate bilateral
facet arthropathy with right facet effusion. 9 mm right facet
intraspinal synovial cyst with mass effect upon the right
intraspinal nerve roots. Mild spinal stenosis. Bilateral
subarticular recess stenosis. Moderate bilateral foraminal stenosis.

L5-S1: Broad-based disc bulge. Moderate left and mild right facet
arthropathy. No foraminal or central canal stenosis.
IMPRESSION: 1. At L4-5 there is a broad-based disc bulge. Prior laminectomy.
Moderate bilateral facet arthropathy with right facet effusion. 9 mm
right facet intraspinal synovial cyst with mass effect upon the
right intraspinal nerve roots. Mild spinal stenosis.
2. Moderate bilateral foraminal stenosis.
3. At L3-4 there is a broad-based disc bulge. Prior laminectomy.
Moderate bilateral facet arthropathy. Mild bilateral foraminal
stenosis. Bilateral subarticular recess stenosis.
4. At L2-3 there is a broad-based disc bulge flattening ventral
thecal sac. Mild bilateral facet arthropathy. Prior laminectomy.
Bilateral subarticular recess stenosis. No spinal stenosis.
5. Overall no significant interval change compared with the prior
exam.

## 2021-07-24 ENCOUNTER — Other Ambulatory Visit: Payer: Self-pay | Admitting: Orthopedic Surgery

## 2021-07-24 DIAGNOSIS — M25512 Pain in left shoulder: Secondary | ICD-10-CM

## 2021-07-24 DIAGNOSIS — S46002A Unspecified injury of muscle(s) and tendon(s) of the rotator cuff of left shoulder, initial encounter: Secondary | ICD-10-CM

## 2021-07-24 DIAGNOSIS — M25312 Other instability, left shoulder: Secondary | ICD-10-CM

## 2021-08-07 ENCOUNTER — Ambulatory Visit
Admission: RE | Admit: 2021-08-07 | Discharge: 2021-08-07 | Disposition: A | Discharge status: Home / Self Care | Payer: Medicare Other | Source: Ambulatory Visit | Attending: Orthopedic Surgery | Admitting: Orthopedic Surgery

## 2021-08-07 DIAGNOSIS — S46002A Unspecified injury of muscle(s) and tendon(s) of the rotator cuff of left shoulder, initial encounter: Secondary | ICD-10-CM

## 2021-08-07 DIAGNOSIS — M25512 Pain in left shoulder: Secondary | ICD-10-CM

## 2021-08-07 DIAGNOSIS — M25312 Other instability, left shoulder: Secondary | ICD-10-CM

## 2021-08-07 IMAGING — MR MR SHOULDER*L* W/O CM
4 of 5 series · 29 of 40 positions shown · non-contrast
Comparison: None.

CLINICAL DATA: Left shoulder pain for 3 weeks

EXAM:
MRI OF THE LEFT SHOULDER WITHOUT CONTRAST
TECHNIQUE: Multiplanar, multisequence MR imaging of the shoulder was performed.
No intravenous contrast was administered.

[Series 6: T2 fat-sat · axial · left · 4.0mm · 0.27mm/px · z∈[-122,-19]mm · 8 of 24 slices shown (1 of 3)]
[im 1/24]
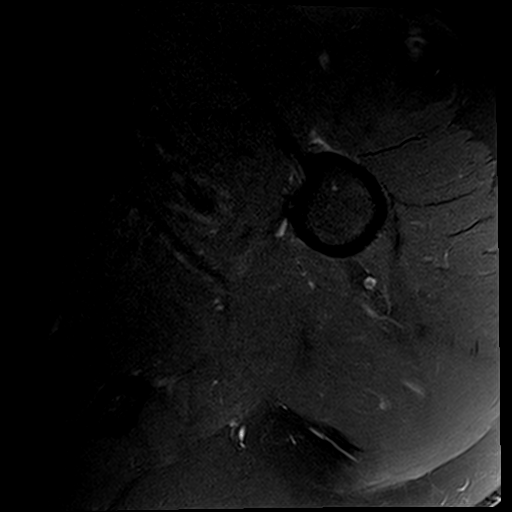
[im 3/24]
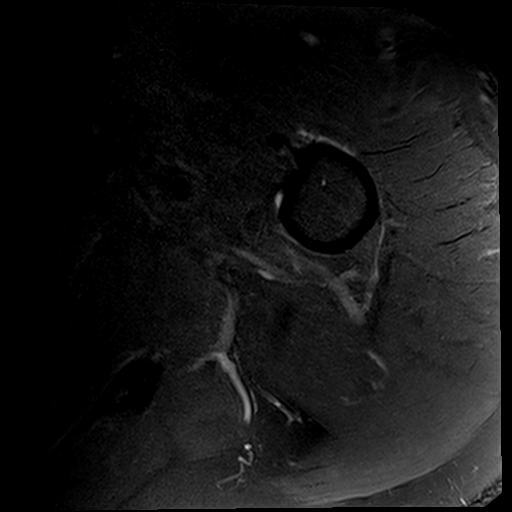
[im 8/24]
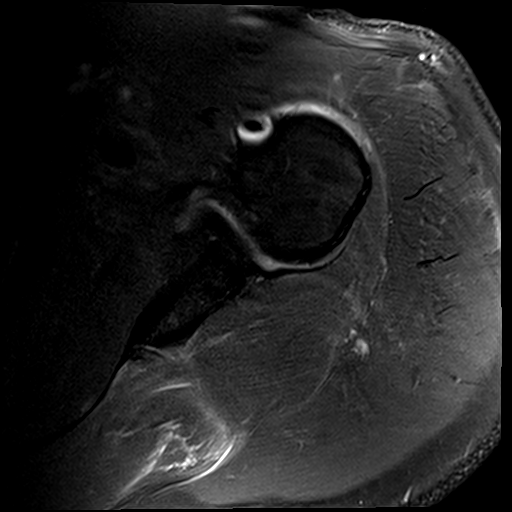
[im 11/24]
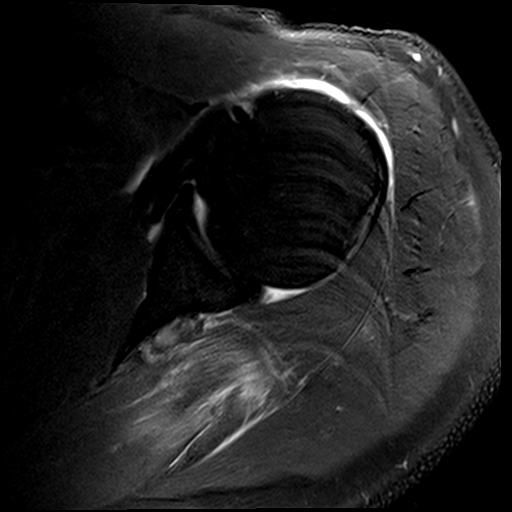
[im 13/24]
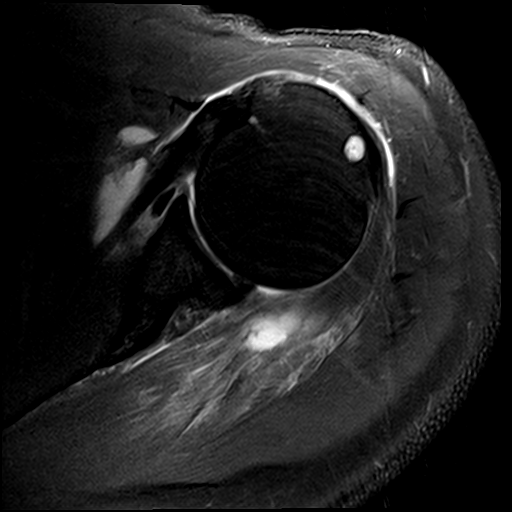
[im 16/24]
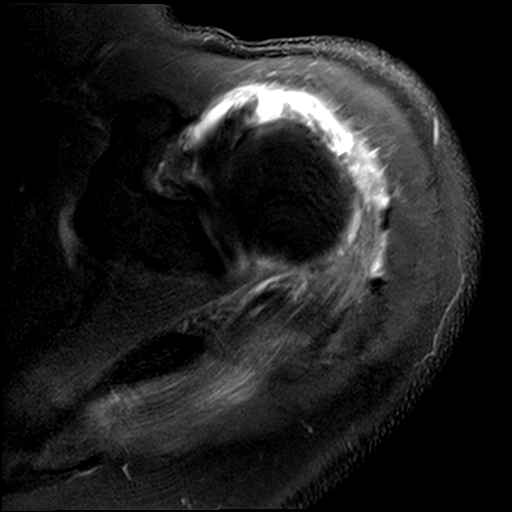
[im 21/24]
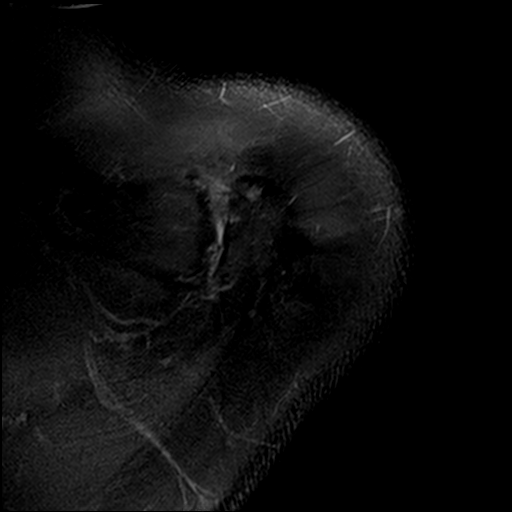
[im 24/24]
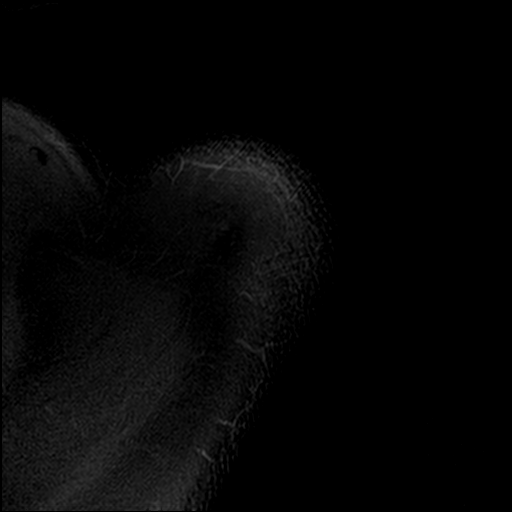

[Series 8: PD · oblique · left · 4.0mm · 0.55mm/px · 7 of 19 slices shown]
[im 1/19]
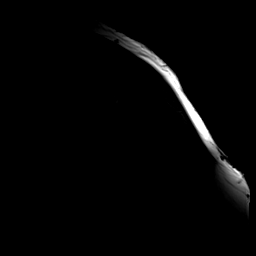
[im 4/19]
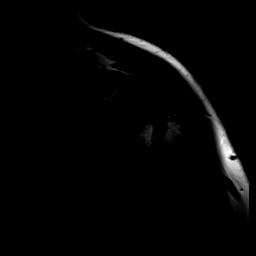
[im 7/19]
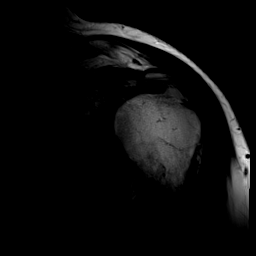
[im 10/19]
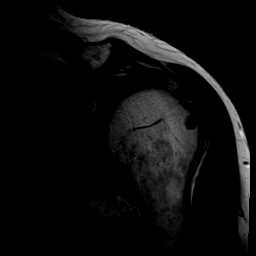
[im 13/19]
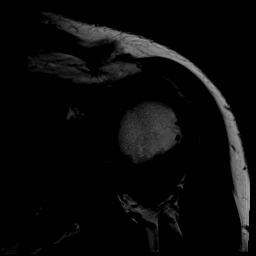
[im 16/19]
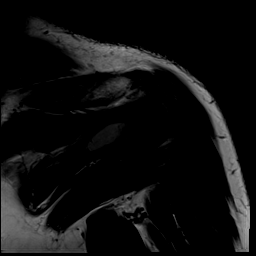
[im 19/19]
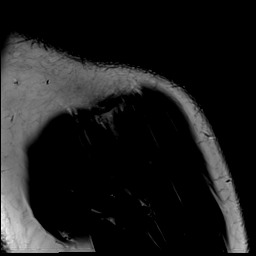

[Series 11: T2 fat-sat · oblique · left · 4.0mm · 0.55mm/px · 8 of 22 slices shown (2 of 3)]
[im 1/22]
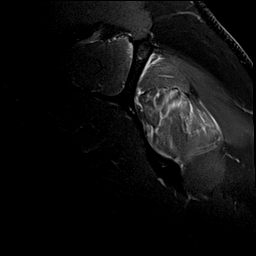
[im 4/22]
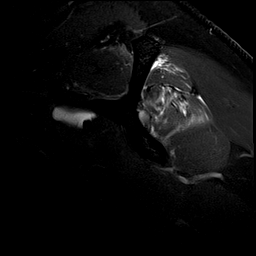
[im 7/22]
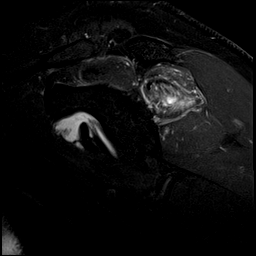
[im 10/22]
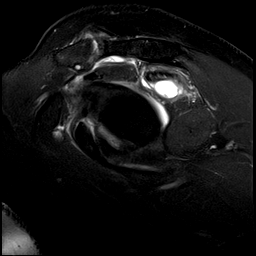
[im 13/22]
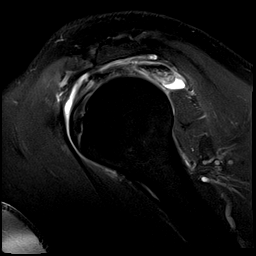
[im 16/22]
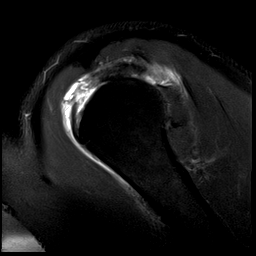
[im 19/22]
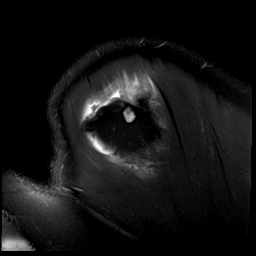
[im 22/22]
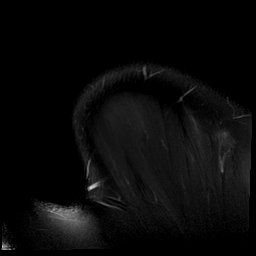

[Series 12: T2 fat-sat · oblique · left · 4.0mm · 0.27mm/px · 6 of 19 slices shown (3 of 3)]
[im 1/19]
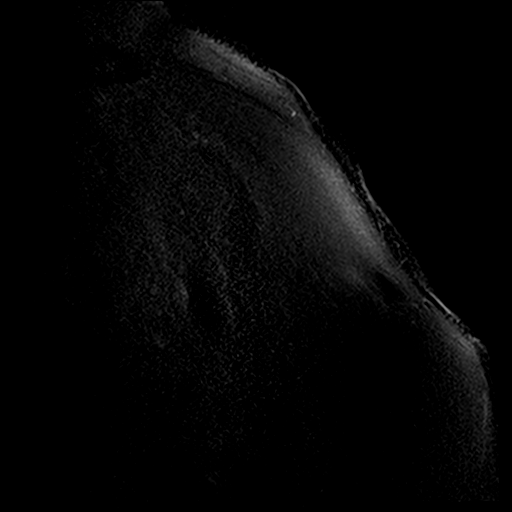
[im 4/19]
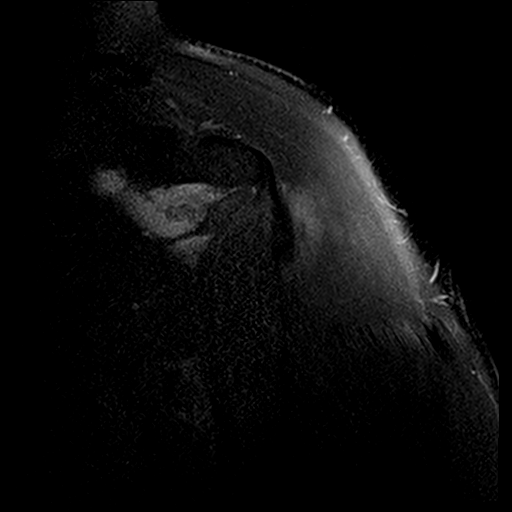
[im 7/19]
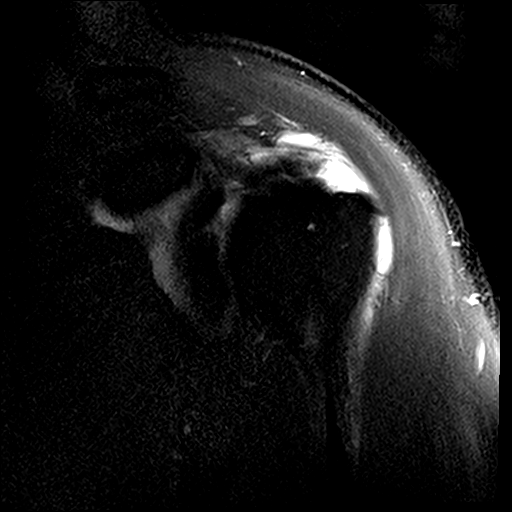
[im 10/19]
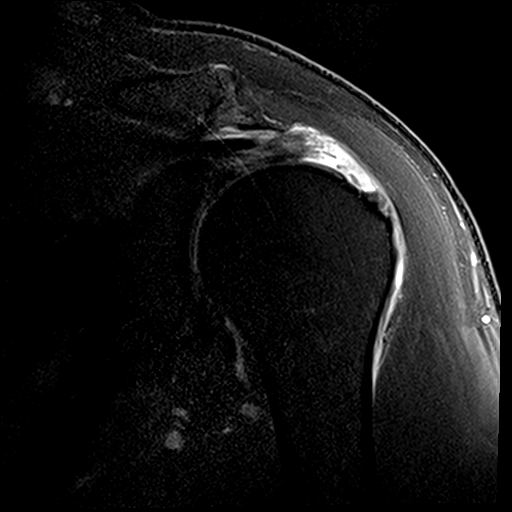
[im 13/19]
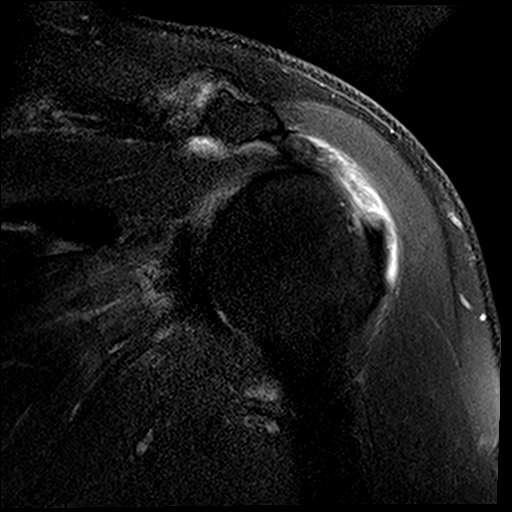
[im 16/19]
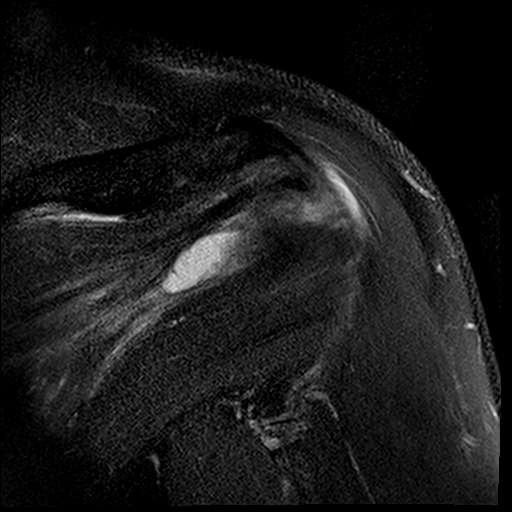

[29 of 40 positions shown; findings below may reference images not displayed]

FINDINGS: Rotator cuff: Severe tendinosis of the supraspinatus tendon with a
complete tear and 2 cm of retraction. Severe tendinosis of the
infraspinatus tendon with a partial-thickness bursal surface tear.
Teres minor tendon is intact. Mild tendinosis of the subscapularis
tendon.

Muscles: Partial-thickness tear of the infraspinatus muscle at the
muscular tendinous junction. No muscle atrophy.

Biceps Long Head: Mild tendinosis of the intra-articular portion of
the long head of the biceps tendon.

Acromioclavicular Joint: Moderate arthropathy of the
acromioclavicular joint. Small amount of subacromial/subdeltoid
bursal fluid.

Glenohumeral Joint: No joint effusion. No chondral defect.

Labrum: Grossly intact, but evaluation is limited by lack of
intraarticular fluid/contrast.

Bones: No fracture or dislocation. No aggressive osseous lesion.

Other: No fluid collection or hematoma.
IMPRESSION: 1. Severe tendinosis of the supraspinatus tendon with a complete
tear and 2 cm of retraction.
2. Severe tendinosis of the infraspinatus tendon with a
partial-thickness bursal surface tear. Partial-thickness tear of the
infraspinatus muscle at the muscular tendinous junction.
3. Mild tendinosis of the subscapularis tendon.
4. Mild tendinosis of the intra-articular portion of the long head
of the biceps tendon.

## 2021-08-09 ENCOUNTER — Other Ambulatory Visit: Payer: Self-pay | Admitting: Orthopedic Surgery

## 2021-08-09 ENCOUNTER — Encounter: Payer: Self-pay | Admitting: Orthopedic Surgery

## 2021-08-13 ENCOUNTER — Ambulatory Visit
Admission: RE | Admit: 2021-08-13 | Discharge: 2021-08-13 | Disposition: A | Discharge status: Home / Self Care | Payer: Medicare Other | Attending: Orthopedic Surgery | Admitting: Orthopedic Surgery

## 2021-08-13 ENCOUNTER — Encounter
Admission: RE | Disposition: A | Discharge status: Home / Self Care | Payer: Self-pay | Source: Home / Self Care | Attending: Orthopedic Surgery

## 2021-08-13 ENCOUNTER — Other Ambulatory Visit: Payer: Self-pay

## 2021-08-13 ENCOUNTER — Ambulatory Visit: Payer: Medicare Other | Admitting: Anesthesiology

## 2021-08-13 ENCOUNTER — Encounter: Payer: Self-pay | Admitting: Orthopedic Surgery

## 2021-08-13 DIAGNOSIS — X58XXXA Exposure to other specified factors, initial encounter: Secondary | ICD-10-CM | POA: Diagnosis not present

## 2021-08-13 DIAGNOSIS — I10 Essential (primary) hypertension: Secondary | ICD-10-CM | POA: Insufficient documentation

## 2021-08-13 DIAGNOSIS — E039 Hypothyroidism, unspecified: Secondary | ICD-10-CM | POA: Insufficient documentation

## 2021-08-13 DIAGNOSIS — M25812 Other specified joint disorders, left shoulder: Secondary | ICD-10-CM | POA: Insufficient documentation

## 2021-08-13 DIAGNOSIS — Z87891 Personal history of nicotine dependence: Secondary | ICD-10-CM | POA: Insufficient documentation

## 2021-08-13 DIAGNOSIS — G4730 Sleep apnea, unspecified: Secondary | ICD-10-CM | POA: Diagnosis not present

## 2021-08-13 DIAGNOSIS — S46012A Strain of muscle(s) and tendon(s) of the rotator cuff of left shoulder, initial encounter: Secondary | ICD-10-CM | POA: Insufficient documentation

## 2021-08-13 HISTORY — PX: SHOULDER ARTHROSCOPY WITH SUBACROMIAL DECOMPRESSION AND OPEN ROTATOR C: SHX5688

## 2021-08-13 SURGERY — SHOULDER ARTHROSCOPY WITH SUBACROMIAL DECOMPRESSION AND OPEN ROTATOR CUFF REPAIR, OPEN BICEPS TENDON REPAIR
Anesthesia: General | Site: Shoulder | Laterality: Left

## 2021-08-13 MED ORDER — MIDAZOLAM HCL 5 MG/5ML IJ SOLN
INTRAMUSCULAR | Status: DC | PRN
  Administered 2021-08-13: 2 mg via INTRAVENOUS

## 2021-08-13 MED ORDER — ACETAMINOPHEN 500 MG PO TABS: 1000.0000 mg | ORAL_TABLET | Freq: Three times a day (TID) | ORAL | 2 refills | Status: AC

## 2021-08-13 MED ORDER — CEFAZOLIN SODIUM-DEXTROSE 2-4 GM/100ML-% IV SOLN
2.0000 g | INTRAVENOUS | Status: AC
  Administered 2021-08-13: 2 g via INTRAVENOUS

## 2021-08-13 MED ORDER — LIDOCAINE HCL (CARDIAC) PF 100 MG/5ML IV SOSY
PREFILLED_SYRINGE | INTRAVENOUS | Status: DC | PRN
  Administered 2021-08-13: 50 mg via INTRATRACHEAL

## 2021-08-13 MED ORDER — EPHEDRINE SULFATE (PRESSORS) 50 MG/ML IJ SOLN
INTRAMUSCULAR | Status: DC | PRN
  Administered 2021-08-13 (×3): 10 mg via INTRAVENOUS
  Administered 2021-08-13 (×3): 5 mg via INTRAVENOUS

## 2021-08-13 MED ORDER — GLYCOPYRROLATE 0.2 MG/ML IJ SOLN
INTRAMUSCULAR | Status: DC | PRN
  Administered 2021-08-13: .1 mg via INTRAVENOUS

## 2021-08-13 MED ORDER — PHENYLEPHRINE HCL (PRESSORS) 10 MG/ML IV SOLN
INTRAVENOUS | Status: DC | PRN
  Administered 2021-08-13: 50 ug via INTRAVENOUS
  Administered 2021-08-13: 100 ug via INTRAVENOUS
  Administered 2021-08-13 (×2): 50 ug via INTRAVENOUS

## 2021-08-13 MED ORDER — ONDANSETRON HCL 4 MG/2ML IJ SOLN
INTRAMUSCULAR | Status: DC | PRN
  Administered 2021-08-13: 4 mg via INTRAVENOUS

## 2021-08-13 MED ORDER — LACTATED RINGERS IR SOLN
Status: DC | PRN
  Administered 2021-08-13: 12000 mL

## 2021-08-13 MED ORDER — OXYCODONE HCL 5 MG/5ML PO SOLN: 5.0000 mg | Freq: Once | ORAL | Status: DC | PRN

## 2021-08-13 MED ORDER — FENTANYL CITRATE PF 50 MCG/ML IJ SOSY: 25.0000 ug | PREFILLED_SYRINGE | INTRAMUSCULAR | Status: DC | PRN

## 2021-08-13 MED ORDER — LACTATED RINGERS IV SOLN
INTRAVENOUS | Status: DC | PRN
  Administered 2021-08-13: 4 mL

## 2021-08-13 MED ORDER — BUPIVACAINE HCL (PF) 0.5 % IJ SOLN
INTRAMUSCULAR | Status: DC | PRN
  Administered 2021-08-13: 20 mL

## 2021-08-13 MED ORDER — FENTANYL CITRATE (PF) 100 MCG/2ML IJ SOLN
INTRAMUSCULAR | Status: DC | PRN
  Administered 2021-08-13: 100 ug via INTRAVENOUS

## 2021-08-13 MED ORDER — OXYCODONE HCL 5 MG PO TABS: 5.0000 mg | ORAL_TABLET | ORAL | 0 refills | Status: AC | PRN

## 2021-08-13 MED ORDER — DEXAMETHASONE SODIUM PHOSPHATE 4 MG/ML IJ SOLN
INTRAMUSCULAR | Status: DC | PRN
  Administered 2021-08-13: 4 mg via INTRAVENOUS

## 2021-08-13 MED ORDER — BUPIVACAINE LIPOSOME 1.3 % IJ SUSP
INTRAMUSCULAR | Status: DC | PRN
  Administered 2021-08-13: 20 mL

## 2021-08-13 MED ORDER — ASPIRIN EC 325 MG PO TBEC: 325.0000 mg | DELAYED_RELEASE_TABLET | Freq: Every day | ORAL | 0 refills | Status: AC

## 2021-08-13 MED ORDER — MEPERIDINE HCL 25 MG/ML IJ SOLN: 6.2500 mg | INTRAMUSCULAR | Status: DC | PRN

## 2021-08-13 MED ORDER — ONDANSETRON 4 MG PO TBDP: 4.0000 mg | ORAL_TABLET | Freq: Three times a day (TID) | ORAL | 0 refills | Status: DC | PRN

## 2021-08-13 MED ORDER — OXYCODONE HCL 5 MG PO TABS: 5.0000 mg | ORAL_TABLET | Freq: Once | ORAL | Status: DC | PRN

## 2021-08-13 MED ORDER — LACTATED RINGERS IV SOLN: INTRAVENOUS | Status: DC

## 2021-08-13 MED ORDER — ONDANSETRON HCL 4 MG/2ML IJ SOLN
4.0000 mg | Freq: Once | INTRAMUSCULAR | Status: AC
Start: 2021-08-13 — End: 2021-08-13
  Administered 2021-08-13: 4 mg via INTRAVENOUS

## 2021-08-13 MED ORDER — PROPOFOL 10 MG/ML IV BOLUS
INTRAVENOUS | Status: DC | PRN
  Administered 2021-08-13: 40 mg via INTRAVENOUS
  Administered 2021-08-13: 130 mg via INTRAVENOUS

## 2021-08-13 SURGICAL SUPPLY — 63 items
ADH SKN CLS APL DERMABOND .7 (GAUZE/BANDAGES/DRESSINGS) ×1
ADPR IRR PORT MULTIBAG TUBE (MISCELLANEOUS) ×1
ANCH SUT 2.9 PUSHLOCK ANCH (Orthopedic Implant) ×1 IMPLANT
ANCH SUT 2X2.3 TAPE (Anchor) ×2 IMPLANT
ANCH SUT 4.75 1 ARM KNTLS (Anchor) ×2 IMPLANT
ANCH SUT SWLK 19.1X4.75 (Anchor) ×1 IMPLANT
ANCHOR 2.3 SP SGL 1.2 XBRAID (Anchor) ×2 IMPLANT
ANCHOR SUT BIO SW 4.75X19.1 (Anchor) ×1 IMPLANT
APL PRP STRL LF DISP 70% ISPRP (MISCELLANEOUS) ×1
BLADE SHAVER 4.5X7 STR FR (MISCELLANEOUS) ×2 IMPLANT
BUR BR 5.5 WIDE MOUTH (BURR) ×2 IMPLANT
CANNULA PART THRD DISP 5.75X7 (CANNULA) IMPLANT
CANNULA PARTIAL THREAD 2X7 (CANNULA) ×1 IMPLANT
CANNULA TWIST IN 8.25X7CM (CANNULA) ×1 IMPLANT
CHLORAPREP W/TINT 26 (MISCELLANEOUS) ×2 IMPLANT
COOLER POLAR GLACIER W/PUMP (MISCELLANEOUS) ×2 IMPLANT
COVER LIGHT HANDLE UNIVERSAL (MISCELLANEOUS) ×4 IMPLANT
DERMABOND ADVANCED (GAUZE/BANDAGES/DRESSINGS) ×1
DERMABOND ADVANCED .7 DNX12 (GAUZE/BANDAGES/DRESSINGS) IMPLANT
DRAPE U-SHAPE 48X52 POLY STRL (PACKS) ×3 IMPLANT
DRSG TEGADERM 4X4.75 (GAUZE/BANDAGES/DRESSINGS) ×6 IMPLANT
ELECT REM PT RETURN 9FT ADLT (ELECTROSURGICAL) ×2
ELECTRODE REM PT RTRN 9FT ADLT (ELECTROSURGICAL) IMPLANT
GAUZE SPONGE 4X4 12PLY STRL (GAUZE/BANDAGES/DRESSINGS) ×2 IMPLANT
GAUZE XEROFORM 1X8 LF (GAUZE/BANDAGES/DRESSINGS) ×2 IMPLANT
GLOVE SRG 8 PF TXTR STRL LF DI (GLOVE) ×3 IMPLANT
GLOVE SURG ENC MOIS LTX SZ7.5 (GLOVE) ×7 IMPLANT
GLOVE SURG UNDER POLY LF SZ8 (GLOVE) ×6
GOWN STRL REIN 2XL XLG LVL4 (GOWN DISPOSABLE) ×2 IMPLANT
GOWN STRL REUS W/ TWL LRG LVL3 (GOWN DISPOSABLE) ×3 IMPLANT
GOWN STRL REUS W/TWL LRG LVL3 (GOWN DISPOSABLE) ×4
IV LACTATED RINGER IRRG 3000ML (IV SOLUTION) ×8
IV LR IRRIG 3000ML ARTHROMATIC (IV SOLUTION) ×6 IMPLANT
KIT STABILIZATION SHOULDER (MISCELLANEOUS) ×2 IMPLANT
KIT TURNOVER KIT A (KITS) ×2 IMPLANT
MANIFOLD NEPTUNE II (INSTRUMENTS) ×2 IMPLANT
MASK FACE SPIDER DISP (MASK) ×2 IMPLANT
MAT GRAY ABSORB FLUID 28X50 (MISCELLANEOUS) ×4 IMPLANT
NDL MAYO CATGUT SZ4 (NEEDLE) ×2 IMPLANT
NDL MAYO CATGUT SZ4 TCR NDL (NEEDLE) IMPLANT
PACK ARTHROSCOPY SHOULDER (MISCELLANEOUS) ×2 IMPLANT
PAD ABD DERMACEA PRESS 5X9 (GAUZE/BANDAGES/DRESSINGS) ×4 IMPLANT
PAD WRAPON POLAR SHDR XLG (MISCELLANEOUS) ×1 IMPLANT
PASSER SUT FIRSTPASS SELF (INSTRUMENTS) ×1 IMPLANT
SET Y ADAPTER MULIT-BAG IRRIG (MISCELLANEOUS) ×1 IMPLANT
SPONGE T-LAP 18X18 ~~LOC~~+RFID (SPONGE) ×1 IMPLANT
SUT ETHILON 3-0 FS-10 30 BLK (SUTURE) ×2
SUT MNCRL 4-0 (SUTURE) ×2
SUT MNCRL 4-0 27XMFL (SUTURE) ×1
SUT VIC AB 0 CT1 36 (SUTURE) ×2 IMPLANT
SUT VIC AB 2-0 CT2 27 (SUTURE) ×1 IMPLANT
SUTURE EHLN 3-0 FS-10 30 BLK (SUTURE) ×1 IMPLANT
SUTURE MNCRL 4-0 27XMF (SUTURE) IMPLANT
SUTURE TAPE 1.3 40 TPR END (SUTURE) IMPLANT
SUTURETAPE 1.3 40 TPR END (SUTURE) ×2
SUTURETAPE 1.3 40 W/NDL BLK/WH (SUTURE) ×1 IMPLANT
SYSTEM ANCHOR SUT KNTLESS 4.75 (Anchor) ×2 IMPLANT
SYSTEM IMPL TENODESIS LNT 2.9 (Orthopedic Implant) ×1 IMPLANT
TUBING CONNECTING 10 (TUBING) ×2 IMPLANT
TUBING INFLOW SET DBFLO PUMP (TUBING) ×2 IMPLANT
TUBING OUTFLOW SET DBLFO PUMP (TUBING) ×2 IMPLANT
WAND WEREWOLF FLOW 90D (MISCELLANEOUS) ×2 IMPLANT
WRAPON POLAR PAD SHDR XLG (MISCELLANEOUS) ×2

## 2021-08-13 NOTE — Anesthesia Procedure Notes (Signed)
Anesthesia Regional Block: Interscalene brachial plexus block  ? ?Pre-Anesthetic Checklist: , timeout performed,  Correct Patient, Correct Site, Correct Laterality,  Correct Procedure, Correct Position, site marked,  Risks and benefits discussed,  Surgical consent,  Pre-op evaluation,  At surgeon's request and post-op pain management ? ?Laterality: Left ? ?Prep: chloraprep     ?  ?Needles:  ?Injection technique: Single-shot ? ?Needle Type: Echogenic Stimulator Needle   ? ? ? ?Needle Gauge: 21  ? ? ? ?Additional Needles: ? ? ?Procedures:, nerve stimulator,,, ultrasound used (permanent image in chart),,    ? ?Nerve Stimulator or Paresthesia:  ?Response: bicep contraction, 0.45 mA ? ?Additional Responses:  ? ?Narrative:  ?Injection made incrementally with aspirations every 5 mL. ? ?Performed by: Personally  ? ?Additional Notes: ?Functioning IV was confirmed and monitors applied.  Sterile prep and drape,hand hygiene and sterile gloves were used.Ultrasound guidance: relevant anatomy identified, needle position confirmed, local anesthetic spread visualized around nerve(s)., vascular puncture avoided.  Image printed for medical record.  Negative aspiration and negative test dose prior to incremental administration of local anesthetic. The patient tolerated the procedure well. Vitals signes recorded in RN notes. ? ? ? ?

## 2021-08-13 NOTE — Discharge Instructions (Addendum)
Post-Op Instructions - Rotator Cuff Repair ? ?1. Bracing: You will wear a shoulder immobilizer or sling for 6 weeks.  ? ?2. Driving: No driving for 3 weeks post-op. When driving, do not wear the immobilizer. Ideally, we recommend no driving for 6 weeks while sling is in place as one arm will be immobilized.  ? ?3. Activity: No active lifting for 2 months. Wrist, hand, and elbow motion only. Avoid lifting the upper arm away from the body except for hygiene. You are permitted to bend and straighten the elbow passively only (no active elbow motion). You may use your hand and wrist for typing, writing, and managing utensils (cutting food). Do not lift more than a coffee cup for 8 weeks.  When sleeping or resting, inclined positions (recliner chair or wedge pillow) and a pillow under the forearm for support may provide better comfort for up to 4 weeks.  Avoid long distance travel for 4 weeks. ? ?Return to normal activities after rotator cuff repair repair normally takes 6 months on average. If rehab goes very well, may be able to do most activities at 4 months, except overhead or contact sports. ? ?4. Physical Therapy: Begins 3-4 days after surgery, and proceed 1 time per week for the first 6 weeks, then 1-2 times per week from weeks 6-20 post-op. ? ?5. Medications:  ?- You will be provided a prescription for narcotic pain medicine. After surgery, take 1-2 narcotic tablets every 4 hours if needed for severe pain.  ?- A prescription for anti-nausea medication will be provided in case the narcotic medicine causes nausea - take 1 tablet every 6 hours only if nauseated.   ?- Take tylenol 1000 mg (2 Extra Strength tablets or 3 regular strength) every 8 hours for pain.  May decrease or stop tylenol 5 days after surgery if you are having minimal pain. ?- Take ASA 325mg/day x 2 weeks to help prevent DVTs/PEs (blood clots).  ?- DO NOT take ANY nonsteroidal anti-inflammatory pain medications (Advil, Motrin, Ibuprofen, Aleve,  Naproxen, or Naprosyn). These medicines can inhibit healing of your shoulder repair.  ? ? ?If you are taking prescription medication for anxiety, depression, insomnia, muscle spasm, chronic pain, or for attention deficit disorder, you are advised that you are at a higher risk of adverse effects with use of narcotics post-op, including narcotic addiction/dependence, depressed breathing, death. ?If you use non-prescribed substances: alcohol, marijuana, cocaine, heroin, methamphetamines, etc., you are at a higher risk of adverse effects with use of narcotics post-op, including narcotic addiction/dependence, depressed breathing, death. ?You are advised that taking > 50 morphine milligram equivalents (MME) of narcotic pain medication per day results in twice the risk of overdose or death. For your prescription provided: oxycodone 5 mg - taking more than 6 tablets per day would result in > 50 morphine milligram equivalents (MME) of narcotic pain medication. ?Be advised that we will prescribe narcotics short-term, for acute post-operative pain only - 3 weeks for major operations such as shoulder repair/reconstruction surgeries.  ? ? ? ?6. Post-Op Appointment: ? ?Your first post-op appointment will be 10-14 days post-op. ? ?7. Work or School: For most, but not all procedures, we advise staying out of work or school for at least 1 to 2 weeks in order to recover from the stress of surgery and to allow time for healing.  ? ?If you need a work or school note this can be provided.  ? ?8. Smoking: If you are a smoker, you need to refrain from   smoking in the postoperative period. The nicotine in cigarettes will inhibit healing of your shoulder repair and decrease the chance of successful repair. Similarly, nicotine containing products (gum, patches) should be avoided.  ? ?Post-operative Brace: ?Apply and remove the brace you received as you were instructed to at the time of fitting and as described in detail as the brace?s  instructions for use indicate.  Wear the brace for the period of time prescribed by your physician.  The brace can be cleaned with soap and water and allowed to air dry only.  Should the brace result in increased pain, decreased feeling (numbness/tingling), increased swelling or an overall worsening of your medical condition, please contact your doctor immediately.  If an emergency situation occurs as a result of wearing the brace after normal business hours, please dial 911 and seek immediate medical attention.  Let your doctor know if you have any further questions about the brace issued to you. ?Refer to the shoulder sling instructions for use if you have any questions regarding the correct fit of your shoulder sling.  ?[facility] Customer Care for Troubleshooting: [phone] ? ?Video that illustrates how to properly use a shoulder sling: ?"Instructions for Proper Use of an Orthopaedic Sling" ?https://www.youtube.com/watch?v=AHZpn_Xo45w ? ? ? ? ? ? ?PERIPHERAL NERVE BLOCK PATIENT INFORMATION ? ?Your surgeon has requested a peripheral nerve block for your surgery. This anesthetic technique provides excellent post-operative pain relief for you in a safe and effective manner. It will also help reduce the risk of nausea and vomiting and allow earlier discharge from the hospital.   ?The block is performed under sedation with ultrasound guidance prior to your procedure. Due to the sedation, your may or may not remember the block experience. The nerve block will begin to take effect anywhere from 5 to 30 minutes after being administered. You will be transported to the operating room from your surgery after the block is completed.   ?At the end of surgery, when the anesthesia wears off, you will notice a few things. Your may not be able to move or feel the part of your body targeted by the nerve block. These are normal experiences, and they will disappear as the block wears off.  ?If you had an interscalene nerve block  performed (which is common for shoulder surgery), your voice can be very hoarse and you may feel that you are not able to take as deep a breath as you did before surgery. Some patients may also notice a droopy eyelid on the affected side. These symptoms will resolve once the block wears off.  ?Pain control: ?The nerve block technique used is a single injection that can last anywhere from 1-3 days. The duration of the numbness can vary between individuals. After leaving the hospital, it is important that you begin to take your prescribed pain medication when you start to sense the nerve block wearing off. This will help you avoid unpleasant pain at the time the nerve block wears off, which can sometimes be in the middle of the night. The block will only cover pain in the areas targeted by the nerve block so if you experience surgical pain outside of that area, please take your prescribed pain medication. ?Management of the ?numb area?: ?After a nerve block, you cannot feel pain, pressure, or temperature in the affected area so there is an increased risk for injury. You should take extra care to protect the affected areas until sensation and movement returns. Please take caution to not come   in contact with extremely hot or cold items because you will not be able to sense or protect yourself form the extremes of temperature.  You may experience some persistent numbness after the procedure by most neurological deficits resolve over time and the incidence of serious long term neurological complications attributable to peripheral nerve blocks are relatively uncommon.    ? ?Information for Discharge Teaching: ?EXPAREL (bupivacaine liposome injectable suspension)  ? ?Your surgeon or anesthesiologist gave you EXPAREL(bupivacaine) to help control your pain after surgery.  ?EXPAREL is a local anesthetic that provides pain relief by numbing the tissue around the surgical site. ?EXPAREL is designed to release pain medication  over time and can control pain for up to 72 hours. ?Depending on how you respond to EXPAREL, you may require less pain medication during your recovery. ? ?Possible side effects: ?Temporary loss of sensation or ability to

## 2021-08-13 NOTE — Anesthesia Procedure Notes (Signed)
Procedure Name: LMA Insertion ?Date/Time: 08/13/2021 12:18 PM ?Performed by: Jimmy Picket, CRNA ?Pre-anesthesia Checklist: Patient identified, Emergency Drugs available, Suction available, Timeout performed and Patient being monitored ?Patient Re-evaluated:Patient Re-evaluated prior to induction ?Oxygen Delivery Method: Circle system utilized ?Preoxygenation: Pre-oxygenation with 100% oxygen ?Induction Type: IV induction ?LMA: LMA inserted ?LMA Size: 4.0 ?Number of attempts: 1 ?Placement Confirmation: positive ETCO2 and breath sounds checked- equal and bilateral ?Tube secured with: Tape ? ? ? ? ?

## 2021-08-13 NOTE — Anesthesia Postprocedure Evaluation (Signed)
Anesthesia Post Note ? ?Patient: Donald Lee ? ?Procedure(s) Performed: Left shoulder arthroscopic biceps tenodesis with subacromial decompression and mini open rotator cuff repair (Left: Shoulder) ? ? ?  ?Patient location during evaluation: PACU ?Anesthesia Type: General ?Level of consciousness: awake and alert ?Pain management: pain level controlled ?Vital Signs Assessment: post-procedure vital signs reviewed and stable ?Respiratory status: spontaneous breathing, nonlabored ventilation, respiratory function stable and patient connected to nasal cannula oxygen ?Cardiovascular status: blood pressure returned to baseline and stable ?Postop Assessment: no apparent nausea or vomiting ?Anesthetic complications: no ? ? ?No notable events documented. ? ?Tailey Top ELAINE ? ? ? ? ? ?

## 2021-08-13 NOTE — Transfer of Care (Signed)
Immediate Anesthesia Transfer of Care Note ? ?Patient: Donald Lee ? ?Procedure(s) Performed: Left shoulder arthroscopic biceps tenodesis with subacromial decompression and mini open rotator cuff repair (Left: Shoulder) ? ?Patient Location: PACU ? ?Anesthesia Type: General ? ?Level of Consciousness: awake, alert  and patient cooperative ? ?Airway and Oxygen Therapy: Patient Spontanous Breathing and Patient connected to supplemental oxygen ? ?Post-op Assessment: Post-op Vital signs reviewed, Patient's Cardiovascular Status Stable, Respiratory Function Stable, Patent Airway and No signs of Nausea or vomiting ? ?Post-op Vital Signs: Reviewed and stable ? ?Complications: No notable events documented. ? ?

## 2021-08-13 NOTE — H&P (Signed)
Paper H&P to be scanned into permanent record. H&P reviewed. No significant changes noted.  

## 2021-08-13 NOTE — Anesthesia Preprocedure Evaluation (Signed)
Anesthesia Evaluation  ?Patient identified by MRN, date of birth, ID band ?Patient awake ? ? ? ?Reviewed: ?Allergy & Precautions, H&P , NPO status , Patient's Chart, lab work & pertinent test results, reviewed documented beta blocker date and time  ? ?Airway ?Mallampati: II ? ?TM Distance: >3 FB ?Neck ROM: full ? ? ? Dental ?no notable dental hx. ? ?  ?Pulmonary ?sleep apnea , Current Smoker and Patient abstained from smoking.,  ?  ?Pulmonary exam normal ?breath sounds clear to auscultation ? ? ? ? ? ? Cardiovascular ?Exercise Tolerance: Good ?hypertension, negative cardio ROS ? ? ?Rhythm:regular Rate:Normal ? ? ?  ?Neuro/Psych ?PSYCHIATRIC DISORDERS Depression negative neurological ROS ?   ? GI/Hepatic ?negative GI ROS, Neg liver ROS,   ?Endo/Other  ?Hypothyroidism  ? Renal/GU ?negative Renal ROS  ?negative genitourinary ?  ?Musculoskeletal ? ? Abdominal ?  ?Peds ? Hematology ?negative hematology ROS ?(+)   ?Anesthesia Other Findings ? ? Reproductive/Obstetrics ?negative OB ROS ? ?  ? ? ? ? ? ? ? ? ? ? ? ? ? ?  ?  ? ? ? ? ? ? ? ? ?Anesthesia Physical ?Anesthesia Plan ? ?ASA: 2 ? ?Anesthesia Plan: General  ? ?Post-op Pain Management: Regional block  ? ?Induction:  ? ?PONV Risk Score and Plan: 1 and Ondansetron, Dexamethasone and Diphenhydramine ? ?Airway Management Planned:  ? ?Additional Equipment:  ? ?Intra-op Plan:  ? ?Post-operative Plan:  ? ?Informed Consent: I have reviewed the patients History and Physical, chart, labs and discussed the procedure including the risks, benefits and alternatives for the proposed anesthesia with the patient or authorized representative who has indicated his/her understanding and acceptance.  ? ? ? ?Dental Advisory Given ? ?Plan Discussed with: CRNA ? ?Anesthesia Plan Comments:   ? ? ? ? ? ? ?Anesthesia Quick Evaluation ? ?

## 2021-08-13 NOTE — Op Note (Addendum)
SURGERY DATE: 08/13/2021 ? ?PRE-OP DIAGNOSIS:  ?1. Left subacromial impingement ?2. Left biceps tendinopathy ?3. Left rotator cuff tear (supraspinatus & infraspinatus) ? ?POST-OP DIAGNOSIS: ?1. Left subacromial impingement ?2. Left biceps tendinopathy ?3. Left rotator cuff tear (supraspinatus & infraspinatus) ? ?PROCEDURES:  ?1. Left mini-open rotator cuff repair ?2. Left arthroscopic biceps tenodesis ?3. Left arthroscopic subacromial decompression ?4. Left arthroscopic extensive debridement of shoulder (glenohumeral and subacromial spaces) ? ?SURGEON: Cato Mulligan, MD ? ?ASSISTANT:  Cecille Amsterdam, PA-S  ? ?ANESTHESIA: Gen with Exparel interscalene block ? ?ESTIMATED BLOOD LOSS: 25cc ? ?DRAINS:  none ? ?TOTAL IV FLUIDS: per anesthesia  ? ?SPECIMENS: none ? ?IMPLANTS:  ?- Arthrex 2.34mm PushLock anchor x 1 ?- Iconix SPEED double loaded with 1.2 and 2.77mm tape x 2 ?- Arthrex 4.19mm SwiveLock anchor double loaded with SutureTape x 1 ?- Stryker 4.69mm Omega Knotless Anchor System  x 2 ? ? ?OPERATIVE FINDINGS:  ?Examination under anesthesia: A careful examination under anesthesia was performed.  Passive range of motion was: FF: 160; ER at side: 45; ER in abduction: 90; IR in abduction: 45.  Anterior load shift: NT.  Posterior load shift: NT.  Sulcus in neutral: NT.  Sulcus in ER: NT.   ? ?Intra-operative findings: A thorough arthroscopic examination of the shoulder was performed.  The findings are: ?1. Biceps tendon: Mild tendinopathy ?2. Superior labrum: injected with surrounding synovitis, type II SLAP tear ?3. Posterior labrum and capsule: Degenerative tearing of the posterior labrum ?4. Inferior capsule and inferior recess: normal ?5. Glenoid cartilage surface: Scattered grade 1-2 changes diffusely ?6. Supraspinatus attachment: full-thickness tear ?7. Posterior rotator cuff attachment: normal ?8. Humeral head articular cartilage: normal except for focal area of grade 4 degenerative change measuring approximately 10 x  10 mm ?9. Rotator interval: significant synovitis ?10: Subscapularis tendon: attachment intact ?11. Anterior labrum: degenerative ?12. IGHL: normal ? ?OPERATIVE REPORT:  ? ?Indications for procedure: Donald Lee is a 71 y.o. male with ~6 weeks of L shoulder pain that began after he attempted to lift a 50 pound piece of luggage at the airport.  He felt a pop and severe pain after that event.  He has had significant difficulty with lifting his arm over his head since that time.  Prior to this injury, he had no significant shoulder dysfunction.  He did receive a corticosteroid injection at his vacation destination after this injury, but this only provided a few days of symptomatic relief.  Clinical exam and MRI were suggestive of full-thickness rotator cuff tear, biceps tendinopathy, and subacromial impingement. After discussion of risks, benefits, and alternatives to surgery, the patient elected to proceed.  ? ?Procedure in detail: ? ?I identified Donald Lee in the pre-operative holding area.  I marked the operative shoulder with my initials. I reviewed the risks and benefits of the proposed surgical intervention, and the patient (and/or patient's guardian) wished to proceed.  Anesthesia was then performed with an Exparel interscalene block.  The patient was transferred to the operative suite and placed in the beach chair position.   ? ?SCDs were placed on the lower extremities. Appropriate IV antibiotics were administered prior to incision. The operative upper extremity was then prepped and draped in standard fashion. A time out was performed confirming the correct extremity, correct patient, and correct procedure.  ? ?I then created a standard posterior portal with an 11 blade. The glenohumeral joint was easily entered with a blunt trochar and the arthroscope introduced. The findings of diagnostic arthroscopy are  described above. I debrided degenerative tissue including the synovitic tissue about the  rotator interval and anterior, posterior, and superior labrum. I then coagulated the inflamed synovium to obtain hemostasis and reduce the risk of post-operative swelling using an Arthrocare radiofrequency device.  The degenerative cartilaginous flaps about the humeral head and glenoid were also debrided using an oscillating shaver. ? ?I then turned my attention to the arthroscopic biceps tenodesis. The Loop n Tack technique was used to pass a FiberTape through the biceps in a locked fashion adjacent to the biceps anchor.  A hole for a 2.9 mm Arthrex PushLock was drilled in the bicipital groove just superior to the subscapularis tendon insertion.  The biceps tendon was then cut and the biceps anchor complex was debrided down to a stable base on the superior labrum. The FiberTape was loaded onto the PushLock anchor and impacted into place into the previously drilled hole in the bicipital groove.  This appropriately secured the biceps into the bicipital groove and took it off of tension. ?   ?Next, the arthroscope was then introduced into the subacromial space. A direct lateral portal was created with an 11-blade after spinal needle localization. An extensive subacromial bursectomy and debridement was performed using a combination of the shaver and Arthrocare wand. The entire acromial undersurface was exposed and the CA ligament was subperiosteally elevated to expose the anterior acromial hook. A 5.74mm barrel burr was used to create a flat anterior and lateral aspect of the acromion, converting it from a Type 2 to a Type 1 acromion. Care was made to keep the deltoid fascia intact.  This concluded the arthroscopic portion of the procedure. ? ?A longitudinal incision from the anterolateral acromion ~7cm in length was made overlying the raphe between the anterior and middle heads of the deltoid. The raphe was identified and it was incised. The subacromial space was identified. Any remaining bursa was excised. The rotator  cuff tear was identified.  The elevators were used to further mobilize the rotator cuff itself.  It was an L-shaped tear with the long limb of the L anterior.  Additionally, there was significant delamination of the rotator cuff.  The inferior delaminated portion was able to be reduced just lateral to the articular margin.  The superior portion was easily able to cover the entirety of the supraspinatus and infraspinatus footprint. ? ?The rotator cuff footprint was cleared of soft tissue. A rongeur was used to gently decorticate the rotator cuff footprint to allow for improved healing. Two Iconix SPEED anchors were placed just lateral to the articular margin, 1 anteriorly and 1 posteriorly.  A third medial row anchor was placed, and this consisted of an Arthrex 4.75 mm SwiveLock anchor double loaded with suture tape as well as the two #2 FiberWire sutures from the anchor.  Given the differential inability to fully reduce the tear between the delaminated layers, 6 strands of suture tape were passed through the inferior delaminated portion (2 strands from each of the 3 medial row anchors), and 8 strands of suture were passed through the superior delaminated portion (2 strands from each anchor, 2 strands of the posterior anchor, and 4 strands from middle anchor). Two Omega anchors were placed for the lateral row anchors with one limb of each of the medial row sutures passed through each anchor.  A side-to-side stitch was placed posteriorly to attach the repaired rotator cuff to the posterior infraspinatus.  An additional stitch was passed from the fixed ends of the anterior anchor  through the dogear anteriorly and tied to another strand coming from the anterior lateral row anchor.  This allowed for excellent reapproximation of the rotator cuff over its footprint. This construct was stable with external and internal rotation. ? ?The wound was thoroughly irrigated.  The deltoid split was closed with 0 Vicryl.  The  subdermal layer was closed with 2-0 Vicryl.  The skin was closed with 4-0 Monocryl and Dermabond. The portals were closed with 3-0 Nylon. Xeroform was applied to the incisions. A sterile dressing was applied

## 2021-08-14 ENCOUNTER — Encounter: Payer: Self-pay | Admitting: Orthopedic Surgery

## 2021-08-27 NOTE — Progress Notes (Signed)
.  acute

## 2021-10-02 ENCOUNTER — Ambulatory Visit (INDEPENDENT_AMBULATORY_CARE_PROVIDER_SITE_OTHER): Payer: Medicare Other

## 2021-10-02 ENCOUNTER — Ambulatory Visit (INDEPENDENT_AMBULATORY_CARE_PROVIDER_SITE_OTHER): Payer: Medicare Other | Admitting: Podiatry

## 2021-10-02 DIAGNOSIS — M778 Other enthesopathies, not elsewhere classified: Secondary | ICD-10-CM

## 2021-10-02 DIAGNOSIS — M19072 Primary osteoarthritis, left ankle and foot: Secondary | ICD-10-CM

## 2021-10-02 NOTE — Patient Instructions (Signed)
Call [facility] to schedule your MRI at the below locations.  Please allow at least 1 business day after your visit to process the referral.  It may take longer depending on approval from insurance.  Please let me know if you have issues or problems scheduling the MRI   [facility] [phone] [address] [place], [place] [address]    

## 2021-10-11 ENCOUNTER — Ambulatory Visit
Admission: RE | Admit: 2021-10-11 | Discharge: 2021-10-11 | Disposition: A | Discharge status: Home / Self Care | Payer: Medicare Other | Source: Ambulatory Visit | Attending: Podiatry | Admitting: Podiatry

## 2021-10-11 DIAGNOSIS — M778 Other enthesopathies, not elsewhere classified: Secondary | ICD-10-CM

## 2021-10-11 DIAGNOSIS — M19072 Primary osteoarthritis, left ankle and foot: Secondary | ICD-10-CM

## 2021-11-04 ENCOUNTER — Ambulatory Visit (INDEPENDENT_AMBULATORY_CARE_PROVIDER_SITE_OTHER): Payer: Medicare Other | Admitting: Podiatry

## 2021-11-04 ENCOUNTER — Encounter: Payer: Self-pay | Admitting: Podiatry

## 2021-11-04 DIAGNOSIS — M19072 Primary osteoarthritis, left ankle and foot: Secondary | ICD-10-CM

## 2021-11-06 ENCOUNTER — Telehealth: Payer: Self-pay

## 2021-11-06 NOTE — Progress Notes (Signed)
  Subjective:  Patient ID: Donald Lee, male    DOB: 01-Jan-1951,  MRN: [id number]  Chief Complaint  Patient presents with   Foot Pain    "It hurts."    71 y.o. male presents with the above complaint. History confirmed with patient.  He relates pain around the top of the mid arch of the left foot.  He had previously had surgery to remove the bone spur on his big toe  Interval history: He returns today for follow-up after completing his CT scan  Objective:  Physical Exam: warm, good capillary refill, no trophic changes or ulcerative lesions, normal DP and PT pulses, normal sensory exam, and mild pain with manipulation and palpation of the second and third tarsometatarsal joints, palpable spurring here.  He has mild pain on the fifth tarsometatarsal joint  Radiographs: Multiple views x-ray of the left foot: Degenerative changes noted in the second and third TMT J of the left foot     IMPRESSION: Severe first MTP joint osteoarthritis. Prior great toe proximal phalanx osteotomy with healed appearance.   Moderate-severe second through fifth TMT joint osteoarthritis, worst at the second and fifth TMT joints.   Mild talonavicular and calcaneocuboid osteoarthritis.     Electronically Signed   By: Caprice Renshaw M.D.   On: 10/11/2021 16:20   Assessment:   No diagnosis found.    Plan:  Patient was evaluated and treated and all questions answered.  We reviewed his CT scan together and discussed the results of this as well as the images.  We discussed again nonsurgical and surgical treatment of osteoarthritis of the midfoot.  He is interested in surgical correction.  I discussed with him that the fifth tarsometatarsal osteoarthritis should be manageable with supportive shoes and orthosis and corticosteroid injections and that fusion of this joint surface can be a difficult recovery and limiting in its motion.  He does have severe osteoarthritis of the second and third  tarsometatarsal joints.  I discussed with him fusion of these joints with bone graft from the heel.  We discussed the risk benefits and potential complications from surgery including but not limited to  pain, swelling, infection, scar, numbness which may be temporary or permanent, chronic pain, stiffness, nerve pain or damage, wound healing problems, bone healing problems including delayed or non-union.  Informed consent was signed and reviewed.  Surgery be scheduled at a mutually agreeable date.  We discussed a period of nonweightbearing for approximately 8 weeks before weightbearing in a boot for an additional 4 to 6 weeks.  All questions were addressed.  No guarantees to the outcome of surgery were able to be made   Surgical plan:  Procedure: -Left foot tarsometatarsal 2 and 3 fusion with bone graft from heel  Location: -[facility]  Anesthesia plan: -IV sedation with regional block  Postoperative pain plan: - Tylenol 1000 mg every 6 hours, ibuprofen 600 mg every 6 hours, gabapentin 300 mg every 8 hours x5 days, oxycodone 5 mg 1-2 tabs every 6 hours only as needed  DVT prophylaxis: -ASA 325 mg twice daily  WB Restrictions / DME needs: -NWB in CAM boot with knee scooter postop     No follow-ups on file.

## 2021-11-06 NOTE — Telephone Encounter (Signed)
Received surgery paperwork from the [place] office. Left a message for [facility] to call and scheduled surgery with Dr. Lilian Kapur

## 2022-05-16 DIAGNOSIS — R7303 Prediabetes: Secondary | ICD-10-CM | POA: Insufficient documentation

## 2022-09-26 NOTE — Progress Notes (Signed)
[facility] [address] [place], [place] [address]  Pulmonary Sleep Medicine   Office Visit Note  Patient Name: Donald Lee DOB: 04/19/1950 MRN [id number]    Chief Complaint: Obstructive Sleep Apnea visit  Brief History:  Donald Lee is seen today for an annual follow up visit for APAP@ 8-18 cmH2O.The patient has a 3.5 year year history of sleep apnea. Patient is using PAP nightly.  The patient feels rested after sleeping with PAP.  The patient reports benefiting from PAP use. Reported sleepiness is  improved and the Epworth Sleepiness Score is 9 out of 24. The patient very seldom take naps. The patient complains of the following: pt is in need of new supplies.  The compliance download shows 99% compliance with an average use time of 8 hours 38 minutes. The AHI is 5.1.  The patient does complain of limb movements disrupting sleep. The patient continues to require PAP therapy in order to eliminate sleep apnea.   ROS  General: (-) fever, (-) chills, (-) night sweat Nose and Sinuses: (-) nasal stuffiness or itchiness, (-) postnasal drip, (-) nosebleeds, (-) sinus trouble. Mouth and Throat: (-) sore throat, (-) hoarseness. Neck: (-) swollen glands, (-) enlarged thyroid, (-) neck pain. Respiratory: - cough, - shortness of breath, - wheezing. Neurologic: - numbness, - tingling. Psychiatric: - anxiety, - depression   Current Medication: Outpatient Encounter Medications as of 09/29/2022  Medication Sig   amLODipine (NORVASC) 5 MG tablet Take 1 tablet by mouth daily.   alfuzosin (UROXATRAL) 10 MG 24 hr tablet Take 10 mg by mouth daily.   atorvastatin (LIPITOR) 20 MG tablet Take 20 mg by mouth daily.   buPROPion (WELLBUTRIN) 75 MG tablet Take 75 mg by mouth 2 (two) times daily.   gabapentin (NEURONTIN) 300 MG capsule Take 300 mg by mouth 3 (three) times daily. (Patient not taking: Reported on 08/09/2021)   levothyroxine (SYNTHROID, LEVOTHROID) 125 MCG tablet 112 mcg. Two  tablets twice daily    losartan-hydrochlorothiazide (HYZAAR) 100-12.5 MG tablet Take 1 tablet by mouth daily.   ondansetron (ZOFRAN-ODT) 4 MG disintegrating tablet Take 1 tablet (4 mg total) by mouth every 8 (eight) hours as needed for nausea or vomiting.   oxybutynin (DITROPAN) 5 MG tablet Take 5 mg by mouth 3 (three) times daily.   sertraline (ZOLOFT) 100 MG tablet Take 100 mg by mouth daily.   No facility-administered encounter medications on file as of 09/29/2022.    Surgical History: Past Surgical History:  Procedure Laterality Date   APPENDECTOMY     BACK SURGERY     FOOT SURGERY     HERNIA REPAIR     SHOULDER ARTHROSCOPY WITH SUBACROMIAL DECOMPRESSION AND OPEN ROTATOR C Left 08/13/2021   Procedure: Left shoulder arthroscopic biceps tenodesis with subacromial decompression and mini open rotator cuff repair;  Surgeon: Signa Kell, MD;  Location: [facility];  Service: Orthopedics;  Laterality: Left;   THYROIDECTOMY, PARTIAL     VASECTOMY      Medical History: Past Medical History:  Diagnosis Date   Benign localized hyperplasia of prostate with urinary obstruction    Depressive disorder    Dyslipidemia    Hyperlipidemia    Hypertension    Hypogammaglobulinemia (HCC)    Hypothyroidism    Hypothyroidism    OSA on CPAP    Primary osteoarthritis of left foot    Spinal stenosis of lumbar region with neurogenic claudication     Family History: Non contributory to the present illness  Social  History: Social History   Socioeconomic History   Marital status: Married    Spouse name: Not on file   Number of children: Not on file   Years of education: Not on file   Highest education level: Not on file  Occupational History   Not on file  Tobacco Use   Smoking status: Light Smoker    Types: Cigars   Smokeless tobacco: Never   Tobacco comments:    2 cigars a day  Vaping Use   Vaping Use: Never used  Substance and Sexual Activity   Alcohol use: Not Currently     Comment: rare   Drug use: Never   Sexual activity: Not on file  Other Topics Concern   Not on file  Social History Narrative   Not on file   Social Determinants of Health   Financial Resource Strain: Not on file  Food Insecurity: Not on file  Transportation Needs: Not on file  Physical Activity: Not on file  Stress: Not on file  Social Connections: Not on file  Intimate Partner Violence: Not on file    Vital Signs: Blood pressure 132/68, pulse 76, resp. rate 16, height 5\' 11"  (1.803 m), weight 256 lb (116.1 kg), SpO2 96 %. Body mass index is 35.7 kg/m.    Examination: General Appearance: The patient is well-developed, well-nourished, and in no distress. Neck Circumference: 46 cm Skin: Gross inspection of skin unremarkable. Head: normocephalic, no gross deformities. Eyes: no gross deformities noted. ENT: ears appear grossly normal Neurologic: Alert and oriented. No involuntary movements.  STOP BANG RISK ASSESSMENT S (snore) Have you been told that you snore?     NO   T (tired) Are you often tired, fatigued, or sleepy during the day?   YES  O (obstruction) Do you stop breathing, choke, or gasp during sleep? NO   P (pressure) Do you have or are you being treated for high blood pressure? YES   B (BMI) Is your body index greater than 35 kg/m? NO   A (age) Are you 48 years old or older? YES   N (neck) Do you have a neck circumference greater than 16 inches?   YES   G (gender) Are you a male? YES   TOTAL STOP/BANG "YES" ANSWERS 5       A STOP-Bang score of 2 or less is considered low risk, and a score of 5 or more is high risk for having either moderate or severe OSA. For people who score 3 or 4, doctors may need to perform further assessment to determine how likely they are to have OSA.         EPWORTH SLEEPINESS SCALE:  Scale:  (0)= no chance of dozing; (1)= slight chance of dozing; (2)= moderate chance of dozing; (3)= high chance of dozing  Chance   Situtation    Sitting and reading: 2    Watching TV: 1    Sitting Inactive in public: 1    As a passenger in car: 2      Lying down to rest: 1    Sitting and talking: 0    Sitting quielty after lunch: 2    In a car, stopped in traffic: 0   TOTAL SCORE:   9 out of 24    SLEEP STUDIES:  PSG (09/2018) AHI 58/hr, Supine AHI 83.4/hr, REM AHI 84.8/hr, min SpO2 68% Titration (09/2018) APAP@ 8-15 cmH2O   CPAP COMPLIANCE DATA:  Date Range: 09/25/2021-09/24/2022  Average Daily Use: 8  hours 38 minutes  Median Use: 8 hours 46 minutes  Compliance for > 4 Hours: 99%  AHI: 5.1 respiratory events per hour  Days Used: 365/365 days  Mask Leak: 22.9  95th Percentile Pressure: 14.7         LABS: No results found for this or any previous visit (from the past 2160 hour(s)).  Radiology: CT FOOT LEFT WO CONTRAST  Result Date: 10/11/2021 CLINICAL DATA:  Foot pain, chronic, osteoarthritis suspected EXAM: CT OF THE LEFT FOOT WITHOUT CONTRAST TECHNIQUE: Multidetector CT imaging of the left foot was performed according to the standard protocol. Multiplanar CT image reconstructions were also generated. RADIATION DOSE REDUCTION: This exam was performed according to the departmental dose-optimization program which includes automated exposure control, adjustment of the mA and/or kV according to patient size and/or use of iterative reconstruction technique. COMPARISON:  Left foot radiograph 10/02/2021 FINDINGS: Bones/Joint/Cartilage Prior great toe proximal phalanx osteotomy with intact screw fixation hardware and healed appearance. There is severe first MTP osteoarthritis. There is moderate to severe second through fifth TMT joint osteoarthritis worst at the second and fifth TMT joints. Mild talonavicular and calcaneocuboid osteoarthritis. Ligaments Suboptimally assessed by CT. Muscles and Tendons No acute myotendinous abnormality by CT. Soft tissues No focal fluid collection. IMPRESSION:  Severe first MTP joint osteoarthritis. Prior great toe proximal phalanx osteotomy with healed appearance. Moderate-severe second through fifth TMT joint osteoarthritis, worst at the second and fifth TMT joints. Mild talonavicular and calcaneocuboid osteoarthritis. Electronically Signed   By: Caprice Renshaw M.D.   On: 10/11/2021 16:20    No results found.  No results found.    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/05/2020   Hypogammaglobulinemia (HCC) 06/21/2020   Obesity (BMI 30-39.9) 04/24/2020   OSA on CPAP 04/17/2020   CPAP use counseling 04/17/2020   Chronic right-sided low back pain with right-sided sciatica 01/05/2020   Foraminal stenosis of cervical region 08/15/2019   Restless leg syndrome 05/11/2019   Depressive disorder 05/19/2018   Dyslipidemia 05/19/2018   Hypothyroidism 05/19/2018   Neck pain 05/19/2018   Class 1 obesity with serious comorbidity and body mass index (BMI) of 34.0 to 34.9 in adult 06/08/2017   Spinal stenosis of lumbar region with neurogenic claudication 09/24/2016   Incomplete emptying of bladder 02/17/2016   Primary osteoarthritis of left foot 10/11/2015   Recurrent major depressive disorder, in remission (HCC) 10/11/2015   Hyperlipidemia 03/24/2014   Hypertension 03/24/2014   Postoperative hypothyroidism 03/24/2014   Benign localized hyperplasia of prostate with urinary obstruction 12/01/2012   Nocturia 12/01/2012   Slowing of urinary stream 12/01/2012   1. OSA on CPAP The patient does tolerate PAP and reports  benefit from PAP use. The patient was reminded how to clean equipment and advised to replace supplies routinely. The patient was also counselled on weight loss. The compliance is excellent. The AHI is 5.1.  OSA on cpap- controlled. Continue with excellent compliance with pap. CPAP continues to be medically necessary to treat this patient's OSA. F/u one year.     2. CPAP use counseling CPAP Counseling: had a lengthy discussion with the patient regarding the importance of PAP therapy in management of the sleep apnea. Patient appears to understand the risk factor reduction and also understands the risks associated with untreated sleep apnea. Patient will try to make a good faith effort to remain compliant with therapy. Also instructed the patient on proper cleaning of  the device including the water must be changed daily if possible and use of distilled water is preferred. Patient understands that the machine should be regularly cleaned with appropriate recommended cleaning solutions that do not damage the PAP machine for example given white vinegar and water rinses. Other methods such as ozone treatment may not be as good as these simple methods to achieve cleaning.   3. Hypertension, unspecified type Hypertension Counseling:   The following hypertensive lifestyle modification were recommended and discussed:  1. Limiting alcohol intake to less than 1 oz/day of ethanol:(24 oz of beer or 8 oz of wine or 2 oz of 100-proof whiskey). 2. Take baby ASA 81 mg daily. 3. Importance of regular aerobic exercise and losing weight. 4. Reduce dietary saturated fat and cholesterol intake for overall cardiovascular health. 5. Maintaining adequate dietary potassium, calcium, and magnesium intake. 6. Regular monitoring of the blood pressure. 7. Reduce sodium intake to less than 100 mmol/day (less than 2.3 gm of sodium or less than 6 gm of sodium choride)       General Counseling: I have discussed the findings of the evaluation and examination with Donald Lee.  I have also discussed any further diagnostic evaluation thatmay be needed or ordered today. Donald Lee verbalizes understanding of the findings of todays visit. We also reviewed his medications today and discussed drug interactions and side effects including but not limited excessive drowsiness and altered mental states. We also  discussed that there is always a risk not just to him but also people around him. he has been encouraged to call the office with any questions or concerns that should arise related to todays visit.  No orders of the defined types were placed in this encounter.       I have personally obtained a history, examined the patient, evaluated laboratory and imaging results, formulated the assessment and plan and placed orders. This patient was seen today by Emmaline Kluver, PA-C in collaboration with Dr. Freda Munro.   Yevonne Pax, MD [facility] Diplomate ABMS Pulmonary Critical Care Medicine and Sleep Medicine

## 2022-09-29 ENCOUNTER — Ambulatory Visit (INDEPENDENT_AMBULATORY_CARE_PROVIDER_SITE_OTHER): Payer: Medicare Other | Admitting: Internal Medicine

## 2022-09-29 VITALS — BP 132/68 | HR 76 | Resp 16 | Ht 71.0 in | Wt 256.0 lb

## 2022-09-29 DIAGNOSIS — G4733 Obstructive sleep apnea (adult) (pediatric): Secondary | ICD-10-CM | POA: Diagnosis not present

## 2022-09-29 DIAGNOSIS — Z7189 Other specified counseling: Secondary | ICD-10-CM

## 2022-09-29 DIAGNOSIS — I10 Essential (primary) hypertension: Secondary | ICD-10-CM | POA: Diagnosis not present

## 2022-09-29 NOTE — Patient Instructions (Signed)

## 2023-03-06 ENCOUNTER — Ambulatory Visit: Payer: Self-pay | Admitting: General Surgery

## 2023-03-06 NOTE — H&P (View-Only) (Signed)
 PATIENT PROFILE: Donald Lee is a 72 y.o. male who presents to the Clinic for consultation at the request of Donald Lipa, NP for evaluation of right recurrent inguinal hernia.  PCP:  Donald Collin, NP  HISTORY OF PRESENT ILLNESS: Mr. Donald Lee reports he has been feeling pain in the right groin.  He also feels a bulge.  Pain is aggravated by certain movements of the groin and applying pressure in the groin.  Elevating factor is resting.  No pain radiation.  Patient has history of right inguinal hernia repair 24 years ago.  Denies any episode of abdominal distention, nausea or vomiting.  Tolerating diet.  Having bowel movement.   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   Formatting of this note might be different from the original. [facility] note from 10/17/16 notes plan for L2-L5 lumbar laminectomy.      Incomplete emptying of bladder 02/17/2016   Recurrent major depressive disorder, in remission (CMS-HCC) 10/11/2015   Overview    Formatting of this note might be different from the original. Last Assessment & Plan:  Was initiated 1 month after wife died. Worked well. Urologist changed to Fluoxetine last month. Insurance denied. Will make adjustments for insurance coverage. Reports still having occasional bouts of depression, but  improved. No SI/HI or thoughts as of late. Issues with sexual side effects with sertraline. Fluoxetine SE not as bad. Will try Wellbutrin.      Primary osteoarthritis of left foot 10/11/2015   Hypertension 03/24/2014   Overview    Formatting of this note might be different from the original. Last Assessment & Plan:  Pt has been taking meds daily as prescribed. No SE, no dizziness, no lightheadedness. Occasionally checks Bps at home. Stable.      Postoperative hypothyroidism 03/24/2014   Overview    Formatting of this note might be different from the original. Last Assessment & Plan:  Taking meds as prescribed. No tachycardia noted. Will check TSH today as last TSH was low and med adjustment was done at that time in August 2019.      Hyperlipidemia 03/24/2014   Overview    Formatting of this note might be different from the original. Last Assessment & Plan:  Taking meds as prescribed. No SE noted. No myalgias. Needs cholesterol levels checked today for medication refill. Pt is fasting      Benign localized hyperplasia of prostate with urinary obstruction 12/01/2012   Overview    [facility]  [facility]  Formatting of this note might be different from the original. [facility]       GENERAL REVIEW OF SYSTEMS:   General ROS: negative for - chills, fatigue, fever, weight gain or weight loss Allergy and Immunology ROS: negative for - hives  Hematological and Lymphatic ROS: negative for - bleeding problems or bruising, negative for  palpable nodes Endocrine ROS: negative for - heat or cold intolerance, hair changes Respiratory ROS: negative for - cough, shortness of breath or wheezing Cardiovascular ROS: no chest pain or palpitations GI ROS: negative for nausea, vomiting, abdominal pain, diarrhea, constipation Musculoskeletal ROS: negative for - joint swelling or muscle pain Neurological ROS: negative for - confusion, syncope Dermatological ROS: negative for pruritus and  rash Psychiatric: negative for anxiety, depression, difficulty sleeping and memory loss  MEDICATIONS: Current Outpatient Medications  Medication Sig Dispense Refill   alfuzosin (UROXATRAL) 10 mg ER tablet Take 10 mg by mouth once daily        atorvastatin (LIPITOR) 20 MG tablet TAKE 1 TABLET BY MOUTH EVERY DAY 90 tablet 3   diclofenac (VOLTAREN) 75 MG EC tablet TAKE 1 TABLET (75 MG TOTAL) BY MOUTH 2 (TWO) TIMES DAILY WITH MEALS 60 tablet 3   finasteride (PROSCAR) 5 mg tablet Take 5 mg by mouth once daily     levothyroxine (SYNTHROID) 112 MCG tablet Take 2 tablets (224 mcg total) by mouth once daily Monday-Saturday.  One tab each week on Sunday. 180 tablet 4   losartan-hydroCHLOROthiazide (HYZAAR) 100-12.5 mg tablet TAKE 1 TABLET BY MOUTH EVERY DAY 90 tablet 3   sertraline (ZOLOFT) 100 MG tablet TAKE 1 TABLET BY MOUTH EVERY DAY 90 tablet 3   solifenacin (VESICARE) 10 MG tablet Take 10 mg by mouth once daily     tirzepatide (ZEPBOUND) 15 mg/0.5 mL pen injector Inject 0.5 mLs (15 mg total) subcutaneously once a week 2 mL 11   sildenafiL (VIAGRA) 100 MG tablet Take by mouth at bedtime as needed     No current facility-administered medications for this visit.    ALLERGIES: Patient has no known allergies.  PAST MEDICAL HISTORY: Past Medical History:  Diagnosis Date   Anxiety    Arthritis    Asthma without status asthmaticus (HHS-HCC)    Colon polyp 10/21/13   splenic flexure polyp-negative   Depression    Hyperlipidemia    Hypertension    Neuropathy    OSA on CPAP    Postoperative hypothyroidism    Thyroid disease     PAST SURGICAL HISTORY: Past Surgical History:  Procedure Laterality Date   APPENDECTOMY  1969   PARATHYROID GLAND SURGERY  1994   THYROID SURGERY  1994   partial   VASECTOMY  1997   HERNIA REPAIR  2000   inguinal   COLONOSCOPY  10/21/2013   repeat 10 years   back surgery   11/12/2016   Lt mini-open rotator cuff reoaur, biceps tenodesis, subacromial  decompression, extensive debridement of shoulder (glenohumeral & subacromial spaces) Left 08/13/2021   Dr. Allena Katz   SPINE SURGERY       FAMILY HISTORY: Family History  Problem Relation Name Age of Onset   Liver cancer Mother     Pancreatic cancer Mother     Cancer Mother     Liver disease Mother     Migraines Mother     High blood pressure (Hypertension) Father     Other Father         deafness   Angina Father     Brain cancer Father     No Known Problems Maternal Grandmother     No Known Problems Maternal Grandfather     No Known Problems Paternal Grandmother     Heart disease Paternal Grandfather     Depression Brother     Thyroid disease Daughter       SOCIAL HISTORY:  Social History   Socioeconomic History   Marital status: Married   Number of children: 3  Occupational History   Occupation: Retired - was in Environmental health practitioner  Tobacco Use   Smoking status: Some Days    Types: Cigars   Smokeless tobacco: Never   Tobacco comments:    Former cigarette smoker, smokes cigars when golfing  Vaping Use   Vaping status: Never Used  Substance and Sexual Activity   Alcohol use: Yes    Alcohol/week: 0.0 standard drinks of alcohol    Comment: occassionaly   Drug use: Never   Sexual activity: Yes    Partners: Female    Birth control/protection: None   Social Drivers of Corporate investment banker Strain: Low Risk  (10/01/2022)   Overall Financial Resource Strain (CARDIA)    Difficulty of Paying Living Expenses: Not hard at all  Food Insecurity: No Food Insecurity (10/01/2022)   Hunger Vital Sign    Worried About Running Out of Food in the Last Year: Never true    Ran Out of Food in the Last Year: Never true  Transportation Needs: No Transportation Needs (10/01/2022)   PRAPARE - Administrator, Civil Service (Medical): No    Lack of Transportation (Non-Medical): No    PHYSICAL EXAM: Vitals:   03/06/23 1049  BP: 127/85  Pulse: 86   Body  mass index is 32.14 kg/m. Weight: (!) 101.6 kg (224 lb)   GENERAL: Alert, active, oriented x3  HEENT: Pupils equal reactive to light. Extraocular movements are intact. Sclera clear. Palpebral conjunctiva normal red color.Pharynx clear.  NECK: Supple with no palpable mass and no adenopathy.  LUNGS: Sound clear with no rales rhonchi or wheezes.  HEART: Regular rhythm S1 and S2 without murmur.  ABDOMEN: Soft and depressible, nontender with no palpable mass, no hepatomegaly.  Right groin bulge, reducible, pain to palpation.  EXTREMITIES: Well-developed well-nourished symmetrical with no dependent edema.  NEUROLOGICAL: Awake alert oriented, facial expression symmetrical, moving all extremities.  REVIEW OF DATA: I have reviewed the following data today: Office Visit on 01/12/2023  Component Date Value   Thyroid Stimulating Horm* 01/12/2023 0.260 (L)    Testosterone, Serum - La* 01/12/2023 356   Ancillary Procedure on 12/29/2022  Component Date Value   LV Ejection Fraction (%) 12/29/2022 55    Aortic Valve Regurgitati* 12/29/2022 none    Aortic Valve Stenosis Gr* 12/29/2022 none    Mitral Valve Regurgitati* 12/29/2022 trivial    Mitral Valve Stenosis Gr* 12/29/2022 none    Tricuspid Valve Regurgit* 12/29/2022 trivial      ASSESSMENT: Mr. Milos is a 72 y.o. male presenting for consultation for right recurrent versus bilateral inguinal hernia.    The patient presents with a symptomatic, reducible right recurrent versus bilateral inguinal hernia. Patient was oriented about the diagnosis of inguinal hernia and its implication. The patient was oriented about the treatment alternatives (observation vs surgical repair). Due to patient symptoms, repair is recommended. Patient oriented about the surgical procedure, the use of mesh and its risk of complications such as: infection, bleeding, injury to vas deference, vasculature and testicle, injury to bowel or bladder, and chronic pain.    Non-recurrent unilateral inguinal hernia without obstruction or gangrene [K40.90]  PLAN: 1.  Robotic assisted laparoscopic right vs bilateral inguinal hernia repair with mesh (38756) 2.  Avoid taking aspirin 5 days before procedure 3.  Contact us if has any question or concern.   Patient verbalized understanding, all questions  were answered, and were agreeable with the plan outlined above.    Carolan Shiver, MD  Electronically signed by Carolan Shiver, MD

## 2023-03-06 NOTE — H&P (Signed)
PATIENT PROFILE: Donald Lee is a 72 y.o. male who presents to the Clinic for consultation at the request of Donald Lipa, NP for evaluation of right recurrent inguinal hernia.  PCP:  Donald Collin, NP  HISTORY OF PRESENT ILLNESS: Donald Lee reports he has been feeling pain in the right groin.  He also feels a bulge.  Pain is aggravated by certain movements of the groin and applying pressure in the groin.  Elevating factor is resting.  No pain radiation.  Patient has history of right inguinal hernia repair 24 years ago.  Denies any episode of abdominal distention, nausea or vomiting.  Tolerating diet.  Having bowel movement.   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   Formatting of this note might be different from the original. [facility] note from 10/17/16 notes plan for L2-L5 lumbar laminectomy.      Incomplete emptying of bladder 02/17/2016   Recurrent major depressive disorder, in remission (CMS-HCC) 10/11/2015   Overview    Formatting of this note might be different from the original. Last Assessment & Plan:  Was initiated 1 month after wife died. Worked well. Urologist changed to Fluoxetine last month. Insurance denied. Will make adjustments for insurance coverage. Reports still having occasional bouts of depression, but  improved. No SI/HI or thoughts as of late. Issues with sexual side effects with sertraline. Fluoxetine SE not as bad. Will try Wellbutrin.      Primary osteoarthritis of left foot 10/11/2015   Hypertension 03/24/2014   Overview    Formatting of this note might be different from the original. Last Assessment & Plan:  Pt has been taking meds daily as prescribed. No SE, no dizziness, no lightheadedness. Occasionally checks Bps at home. Stable.      Postoperative hypothyroidism 03/24/2014   Overview    Formatting of this note might be different from the original. Last Assessment & Plan:  Taking meds as prescribed. No tachycardia noted. Will check TSH today as last TSH was low and med adjustment was done at that time in August 2019.      Hyperlipidemia 03/24/2014   Overview    Formatting of this note might be different from the original. Last Assessment & Plan:  Taking meds as prescribed. No SE noted. No myalgias. Needs cholesterol levels checked today for medication refill. Pt is fasting      Benign localized hyperplasia of prostate with urinary obstruction 12/01/2012   Overview    [facility]  [facility]  Formatting of this note might be different from the original. [facility]       GENERAL REVIEW OF SYSTEMS:   General ROS: negative for - chills, fatigue, fever, weight gain or weight loss Allergy and Immunology ROS: negative for - hives  Hematological and Lymphatic ROS: negative for - bleeding problems or bruising, negative for  palpable nodes Endocrine ROS: negative for - heat or cold intolerance, hair changes Respiratory ROS: negative for - cough, shortness of breath or wheezing Cardiovascular ROS: no chest pain or palpitations GI ROS: negative for nausea, vomiting, abdominal pain, diarrhea, constipation Musculoskeletal ROS: negative for - joint swelling or muscle pain Neurological ROS: negative for - confusion, syncope Dermatological ROS: negative for pruritus and  rash Psychiatric: negative for anxiety, depression, difficulty sleeping and memory loss  MEDICATIONS: Current Outpatient Medications  Medication Sig Dispense Refill   alfuzosin (UROXATRAL) 10 mg ER tablet Take 10 mg by mouth once daily        atorvastatin (LIPITOR) 20 MG tablet TAKE 1 TABLET BY MOUTH EVERY DAY 90 tablet 3   diclofenac (VOLTAREN) 75 MG EC tablet TAKE 1 TABLET (75 MG TOTAL) BY MOUTH 2 (TWO) TIMES DAILY WITH MEALS 60 tablet 3   finasteride (PROSCAR) 5 mg tablet Take 5 mg by mouth once daily     levothyroxine (SYNTHROID) 112 MCG tablet Take 2 tablets (224 mcg total) by mouth once daily Monday-Saturday.  One tab each week on Sunday. 180 tablet 4   losartan-hydroCHLOROthiazide (HYZAAR) 100-12.5 mg tablet TAKE 1 TABLET BY MOUTH EVERY DAY 90 tablet 3   sertraline (ZOLOFT) 100 MG tablet TAKE 1 TABLET BY MOUTH EVERY DAY 90 tablet 3   solifenacin (VESICARE) 10 MG tablet Take 10 mg by mouth once daily     tirzepatide (ZEPBOUND) 15 mg/0.5 mL pen injector Inject 0.5 mLs (15 mg total) subcutaneously once a week 2 mL 11   sildenafiL (VIAGRA) 100 MG tablet Take by mouth at bedtime as needed     No current facility-administered medications for this visit.    ALLERGIES: Patient has no known allergies.  PAST MEDICAL HISTORY: Past Medical History:  Diagnosis Date   Anxiety    Arthritis    Asthma without status asthmaticus (HHS-HCC)    Colon polyp 10/21/13   splenic flexure polyp-negative   Depression    Hyperlipidemia    Hypertension    Neuropathy    OSA on CPAP    Postoperative hypothyroidism    Thyroid disease     PAST SURGICAL HISTORY: Past Surgical History:  Procedure Laterality Date   APPENDECTOMY  1969   PARATHYROID GLAND SURGERY  1994   THYROID SURGERY  1994   partial   VASECTOMY  1997   HERNIA REPAIR  2000   inguinal   COLONOSCOPY  10/21/2013   repeat 10 years   back surgery   11/12/2016   Lt mini-open rotator cuff reoaur, biceps tenodesis, subacromial  decompression, extensive debridement of shoulder (glenohumeral & subacromial spaces) Left 08/13/2021   Dr. Allena Katz   SPINE SURGERY       FAMILY HISTORY: Family History  Problem Relation Name Age of Onset   Liver cancer Mother     Pancreatic cancer Mother     Cancer Mother     Liver disease Mother     Migraines Mother     High blood pressure (Hypertension) Father     Other Father         deafness   Angina Father     Brain cancer Father     No Known Problems Maternal Grandmother     No Known Problems Maternal Grandfather     No Known Problems Paternal Grandmother     Heart disease Paternal Grandfather     Depression Brother     Thyroid disease Daughter       SOCIAL HISTORY:  Social History   Socioeconomic History   Marital status: Married   Number of children: 3  Occupational History   Occupation: Retired - was in Environmental health practitioner  Tobacco Use   Smoking status: Some Days    Types: Cigars   Smokeless tobacco: Never   Tobacco comments:    Former cigarette smoker, smokes cigars when golfing  Vaping Use   Vaping status: Never Used  Substance and Sexual Activity   Alcohol use: Yes    Alcohol/week: 0.0 standard drinks of alcohol    Comment: occassionaly   Drug use: Never   Sexual activity: Yes    Partners: Female    Birth control/protection: None   Social Drivers of Corporate investment banker Strain: Low Risk  (10/01/2022)   Overall Financial Resource Strain (CARDIA)    Difficulty of Paying Living Expenses: Not hard at all  Food Insecurity: No Food Insecurity (10/01/2022)   Hunger Vital Sign    Worried About Running Out of Food in the Last Year: Never true    Ran Out of Food in the Last Year: Never true  Transportation Needs: No Transportation Needs (10/01/2022)   PRAPARE - Administrator, Civil Service (Medical): No    Lack of Transportation (Non-Medical): No    PHYSICAL EXAM: Vitals:   03/06/23 1049  BP: 127/85  Pulse: 86   Body  mass index is 32.14 kg/m. Weight: (!) 101.6 kg (224 lb)   GENERAL: Alert, active, oriented x3  HEENT: Pupils equal reactive to light. Extraocular movements are intact. Sclera clear. Palpebral conjunctiva normal red color.Pharynx clear.  NECK: Supple with no palpable mass and no adenopathy.  LUNGS: Sound clear with no rales rhonchi or wheezes.  HEART: Regular rhythm S1 and S2 without murmur.  ABDOMEN: Soft and depressible, nontender with no palpable mass, no hepatomegaly.  Right groin bulge, reducible, pain to palpation.  EXTREMITIES: Well-developed well-nourished symmetrical with no dependent edema.  NEUROLOGICAL: Awake alert oriented, facial expression symmetrical, moving all extremities.  REVIEW OF DATA: I have reviewed the following data today: Office Visit on 01/12/2023  Component Date Value   Thyroid Stimulating Horm* 01/12/2023 0.260 (L)    Testosterone, Serum - La* 01/12/2023 356   Ancillary Procedure on 12/29/2022  Component Date Value   LV Ejection Fraction (%) 12/29/2022 55    Aortic Valve Regurgitati* 12/29/2022 none    Aortic Valve Stenosis Gr* 12/29/2022 none    Mitral Valve Regurgitati* 12/29/2022 trivial    Mitral Valve Stenosis Gr* 12/29/2022 none    Tricuspid Valve Regurgit* 12/29/2022 trivial      ASSESSMENT: Mr. Milos is a 72 y.o. male presenting for consultation for right recurrent versus bilateral inguinal hernia.    The patient presents with a symptomatic, reducible right recurrent versus bilateral inguinal hernia. Patient was oriented about the diagnosis of inguinal hernia and its implication. The patient was oriented about the treatment alternatives (observation vs surgical repair). Due to patient symptoms, repair is recommended. Patient oriented about the surgical procedure, the use of mesh and its risk of complications such as: infection, bleeding, injury to vas deference, vasculature and testicle, injury to bowel or bladder, and chronic pain.    Non-recurrent unilateral inguinal hernia without obstruction or gangrene [K40.90]  PLAN: 1.  Robotic assisted laparoscopic right vs bilateral inguinal hernia repair with mesh (38756) 2.  Avoid taking aspirin 5 days before procedure 3.  Contact us if has any question or concern.   Patient verbalized understanding, all questions  were answered, and were agreeable with the plan outlined above.    Carolan Shiver, MD  Electronically signed by Carolan Shiver, MD

## 2023-03-25 ENCOUNTER — Encounter
Admission: RE | Admit: 2023-03-25 | Discharge: 2023-03-25 | Disposition: A | Discharge status: Home / Self Care | Payer: Medicare Other | Source: Ambulatory Visit | Attending: General Surgery | Admitting: General Surgery

## 2023-03-25 ENCOUNTER — Other Ambulatory Visit: Payer: Self-pay

## 2023-03-25 VITALS — Ht 71.0 in | Wt 210.0 lb

## 2023-03-25 DIAGNOSIS — Z01812 Encounter for preprocedural laboratory examination: Secondary | ICD-10-CM | POA: Insufficient documentation

## 2023-03-25 DIAGNOSIS — I10 Essential (primary) hypertension: Secondary | ICD-10-CM | POA: Diagnosis not present

## 2023-03-25 LAB — CBC
HCT: 32.5 % — ABNORMAL LOW (ref 39.0–52.0)
Hemoglobin: 11.5 g/dL — ABNORMAL LOW (ref 13.0–17.0)
MCH: 33.7 pg (ref 26.0–34.0)
MCHC: 35.4 g/dL (ref 30.0–36.0)
MCV: 95.3 fL (ref 80.0–100.0)
Platelets: 332 10*3/uL (ref 150–400)
RBC: 3.41 MIL/uL — ABNORMAL LOW (ref 4.22–5.81)
RDW: 11.9 % (ref 11.5–15.5)
WBC: 11 10*3/uL — ABNORMAL HIGH (ref 4.0–10.5)
nRBC: 0 % (ref 0.0–0.2)

## 2023-03-25 LAB — BASIC METABOLIC PANEL
Anion gap: 10 (ref 5–15)
BUN: 33 mg/dL — ABNORMAL HIGH (ref 8–23)
CO2: 24 mmol/L (ref 22–32)
Calcium: 8.7 mg/dL — ABNORMAL LOW (ref 8.9–10.3)
Chloride: 102 mmol/L (ref 98–111)
Creatinine, Ser: 1.58 mg/dL — ABNORMAL HIGH (ref 0.61–1.24)
GFR, Estimated: 46 mL/min — ABNORMAL LOW (ref >60)
Glucose, Bld: 103 mg/dL — ABNORMAL HIGH (ref 70–99)
Potassium: 4.2 mmol/L (ref 3.5–5.1)
Sodium: 136 mmol/L (ref 135–145)

## 2023-03-25 NOTE — Patient Instructions (Addendum)
Your procedure is scheduled on: Friday 03/27/23  Report to the Registration Desk on the 1st floor of the [facility]. To find out your arrival time, please call [phone] between 1PM - 3PM on: Thursday 03/26/23 If your arrival time is 6:00 am, do not arrive before that time as the [facility] entrance doors do not open until 6:00 am.  REMEMBER: Instructions that are not followed completely may result in serious medical risk, up to and including death; or upon the discretion of your surgeon and anesthesiologist your surgery may need to be rescheduled.  Do not eat food or drink fluids after midnight the night before surgery.  No gum chewing or hard candies.   One week prior to surgery: Stop Anti-inflammatories (NSAIDS) such as Advil, Aleve, Ibuprofen, Motrin, Naproxen, Naprosyn and Aspirin based products such as Excedrin, Goody's Powder, BC Powder. Stop ANY OVER THE COUNTER supplements until after surgery.  You may however, continue to take Tylenol if needed for pain up until the day of surgery.  Stop ZEPBOUND 15 MG/0.5ML 1 week prior to surgery.   Continue taking all of your other prescription medications up until the day of surgery.  ON THE DAY OF SURGERY ONLY TAKE THESE MEDICATIONS WITH SIPS OF WATER:  levothyroxine (SYNTHROID, LEVOTHROID) 125 MCG  sertraline (ZOLOFT) 100 MG   Use inhalers on the day of surgery and bring to the hospital. albuterol (VENTOLIN HFA) 108 (90 Base) MCG/ACT inhaler   No Alcohol for 24 hours before or after surgery.  No Smoking including e-cigarettes for 24 hours before surgery.  No chewable tobacco products for at least 6 hours before surgery.  No nicotine patches on the day of surgery.  Do not use any "recreational" drugs for at least a week (preferably 2 weeks) before your surgery.  Please be advised that the combination of cocaine and anesthesia may have negative outcomes, up to and including death. If you test positive for cocaine, your  surgery will be cancelled.  On the morning of surgery brush your teeth with toothpaste and water, you may rinse your mouth with mouthwash if you wish. Do not swallow any toothpaste or mouthwash.  Use CHG Soap or wipes as directed on instruction sheet.  Do not wear jewelry, make-up, hairpins, clips or nail polish.  For welded (permanent) jewelry: bracelets, anklets, waist bands, etc.  Please have this removed prior to surgery.  If it is not removed, there is a chance that hospital personnel will need to cut it off on the day of surgery.  Do not wear lotions, powders, or perfumes.   Do not shave body hair from the neck down 48 hours before surgery.  Contact lenses, hearing aids and dentures may not be worn into surgery.  Do not bring valuables to the hospital. [facility] is not responsible for any missing/lost belongings or valuables.   Bring your C-PAP to the hospital in case you may have to spend the night.   Notify your doctor if there is any change in your medical condition (cold, fever, infection).  Wear comfortable clothing (specific to your surgery type) to the hospital.  After surgery, you can help prevent lung complications by doing breathing exercises.  Take deep breaths and cough every 1-2 hours. Your doctor may order a device called an Incentive Spirometer to help you take deep breaths. When coughing or sneezing, hold a pillow firmly against your incision with both hands. This is called "splinting." Doing this helps protect your incision. It also decreases  belly discomfort.  If you are being admitted to the hospital overnight, leave your suitcase in the car. After surgery it may be brought to your room.  In case of increased patient census, it may be necessary for you, the patient, to continue your postoperative care in the Same Day Surgery department.  If you are being discharged the day of surgery, you will not be allowed to drive home. You will need a responsible  individual to drive you home and stay with you for 24 hours after surgery.   If you are taking public transportation, you will need to have a responsible individual with you.  Please call the Pre-admissions Testing Dept. at [phone] if you have any questions about these instructions.  Surgery Visitation Policy:  Patients having surgery or a procedure may have two visitors.  Children under the age of 40 must have an adult with them who is not the patient.  Inpatient Visitation:    Visiting hours are 7 a.m. to 8 p.m. Up to four visitors are allowed at one time in a patient room. The visitors may rotate out with other people during the day.  One visitor age 71 or older may stay with the patient overnight and must be in the room by 8 p.m.    Preparing for Surgery with CHLORHEXIDINE GLUCONATE (CHG) Soap  Chlorhexidine Gluconate (CHG) Soap  o An antiseptic cleaner that kills germs and bonds with the skin to continue killing germs even after washing  o Used for showering the night before surgery and morning of surgery  Before surgery, you can play an important role by reducing the number of germs on your skin.  CHG (Chlorhexidine gluconate) soap is an antiseptic cleanser which kills germs and bonds with the skin to continue killing germs even after washing.  Please do not use if you have an allergy to CHG or antibacterial soaps. If your skin becomes reddened/irritated stop using the CHG.  1. Shower the NIGHT BEFORE SURGERY and the MORNING OF SURGERY with CHG soap.  2. If you choose to wash your hair, wash your hair first as usual with your normal shampoo.  3. After shampooing, rinse your hair and body thoroughly to remove the shampoo.  4. Use CHG as you would any other liquid soap. You can apply CHG directly to the skin and wash gently with a scrungie or a clean washcloth.  5. Apply the CHG soap to your body only from the neck down. Do not use on open wounds or open sores.  Avoid contact with your eyes, ears, mouth, and genitals (private parts). Wash face and genitals (private parts) with your normal soap.  6. Wash thoroughly, paying special attention to the area where your surgery will be performed.  7. Thoroughly rinse your body with warm water.  8. Do not shower/wash with your normal soap after using and rinsing off the CHG soap.  9. Pat yourself dry with a clean towel.  10. Wear clean pajamas to bed the night before surgery.  12. Place clean sheets on your bed the night of your first shower and do not sleep with pets.  13. Shower again with the CHG soap on the day of surgery prior to arriving at the hospital.  14. Do not apply any deodorants/lotions/powders.  15. Please wear clean clothes to the hospital.

## 2023-03-27 ENCOUNTER — Encounter: Payer: Self-pay | Admitting: General Surgery

## 2023-03-27 ENCOUNTER — Other Ambulatory Visit: Payer: Self-pay

## 2023-03-27 ENCOUNTER — Ambulatory Visit: Payer: Medicare Other | Admitting: Urgent Care

## 2023-03-27 ENCOUNTER — Encounter
Admission: RE | Disposition: A | Discharge status: Home / Self Care | Payer: Self-pay | Source: Ambulatory Visit | Attending: General Surgery

## 2023-03-27 ENCOUNTER — Ambulatory Visit: Payer: Medicare Other | Admitting: Anesthesiology

## 2023-03-27 ENCOUNTER — Ambulatory Visit
Admission: RE | Admit: 2023-03-27 | Discharge: 2023-03-27 | Disposition: A | Discharge status: Home / Self Care | Payer: Medicare Other | Source: Ambulatory Visit | Attending: General Surgery | Admitting: General Surgery

## 2023-03-27 DIAGNOSIS — K4091 Unilateral inguinal hernia, without obstruction or gangrene, recurrent: Secondary | ICD-10-CM | POA: Diagnosis present

## 2023-03-27 DIAGNOSIS — Z9889 Other specified postprocedural states: Secondary | ICD-10-CM | POA: Diagnosis not present

## 2023-03-27 DIAGNOSIS — E890 Postprocedural hypothyroidism: Secondary | ICD-10-CM | POA: Diagnosis not present

## 2023-03-27 DIAGNOSIS — I10 Essential (primary) hypertension: Secondary | ICD-10-CM | POA: Insufficient documentation

## 2023-03-27 DIAGNOSIS — G4733 Obstructive sleep apnea (adult) (pediatric): Secondary | ICD-10-CM | POA: Diagnosis not present

## 2023-03-27 DIAGNOSIS — D176 Benign lipomatous neoplasm of spermatic cord: Secondary | ICD-10-CM | POA: Insufficient documentation

## 2023-03-27 DIAGNOSIS — F17290 Nicotine dependence, other tobacco product, uncomplicated: Secondary | ICD-10-CM | POA: Insufficient documentation

## 2023-03-27 DIAGNOSIS — J449 Chronic obstructive pulmonary disease, unspecified: Secondary | ICD-10-CM | POA: Insufficient documentation

## 2023-03-27 SURGERY — REPAIR, HERNIA, INGUINAL, BILATERAL, ROBOT-ASSISTED
Anesthesia: General | Site: Inguinal | Laterality: Bilateral

## 2023-03-27 MED ORDER — ROCURONIUM BROMIDE 100 MG/10ML IV SOLN
INTRAVENOUS | Status: DC | PRN
  Administered 2023-03-27: 20 mg via INTRAVENOUS
  Administered 2023-03-27: 50 mg via INTRAVENOUS

## 2023-03-27 MED ORDER — CHLORHEXIDINE GLUCONATE 0.12 % MT SOLN
15.0000 mL | Freq: Once | OROMUCOSAL | Status: AC
  Administered 2023-03-27: 15 mL via OROMUCOSAL

## 2023-03-27 MED ORDER — OXYCODONE HCL 5 MG PO TABS
5.0000 mg | ORAL_TABLET | Freq: Once | ORAL | Status: AC | PRN
  Administered 2023-03-27: 5 mg via ORAL

## 2023-03-27 MED ORDER — OXYCODONE HCL 5 MG/5ML PO SOLN
5.0000 mg | Freq: Once | ORAL | Status: AC | PRN
Start: 2023-03-27 — End: 2023-03-27

## 2023-03-27 MED ORDER — OXYCODONE-ACETAMINOPHEN 5-325 MG PO TABS: 1.0000 | ORAL_TABLET | ORAL | 0 refills | Status: DC | PRN

## 2023-03-27 MED ORDER — MIDAZOLAM HCL 2 MG/2ML IJ SOLN
INTRAMUSCULAR | Status: AC
  Filled 2023-03-27: qty 2

## 2023-03-27 MED ORDER — DEXAMETHASONE SODIUM PHOSPHATE 10 MG/ML IJ SOLN
INTRAMUSCULAR | Status: DC | PRN
  Administered 2023-03-27: 5 mg via INTRAVENOUS

## 2023-03-27 MED ORDER — 0.9 % SODIUM CHLORIDE (POUR BTL) OPTIME
TOPICAL | Status: DC | PRN
  Administered 2023-03-27: 500 mL

## 2023-03-27 MED ORDER — LIDOCAINE HCL (CARDIAC) PF 100 MG/5ML IV SOSY
PREFILLED_SYRINGE | INTRAVENOUS | Status: DC | PRN
  Administered 2023-03-27: 80 mg via INTRAVENOUS

## 2023-03-27 MED ORDER — CHLORHEXIDINE GLUCONATE 0.12 % MT SOLN
OROMUCOSAL | Status: AC
  Filled 2023-03-27: qty 15

## 2023-03-27 MED ORDER — SEVOFLURANE IN SOLN
RESPIRATORY_TRACT | Status: AC
  Filled 2023-03-27: qty 250

## 2023-03-27 MED ORDER — FENTANYL CITRATE (PF) 100 MCG/2ML IJ SOLN
INTRAMUSCULAR | Status: DC | PRN
  Administered 2023-03-27 (×2): 50 ug via INTRAVENOUS

## 2023-03-27 MED ORDER — BUPIVACAINE-EPINEPHRINE (PF) 0.25% -1:200000 IJ SOLN
INTRAMUSCULAR | Status: AC
  Filled 2023-03-27: qty 30

## 2023-03-27 MED ORDER — CEFAZOLIN SODIUM-DEXTROSE 2-4 GM/100ML-% IV SOLN
INTRAVENOUS | Status: AC
  Filled 2023-03-27: qty 100

## 2023-03-27 MED ORDER — ORAL CARE MOUTH RINSE: 15.0000 mL | Freq: Once | OROMUCOSAL | Status: AC

## 2023-03-27 MED ORDER — DEXAMETHASONE SODIUM PHOSPHATE 10 MG/ML IJ SOLN
INTRAMUSCULAR | Status: AC
  Filled 2023-03-27: qty 1

## 2023-03-27 MED ORDER — EPHEDRINE SULFATE-NACL 50-0.9 MG/10ML-% IV SOSY
PREFILLED_SYRINGE | INTRAVENOUS | Status: DC | PRN
  Administered 2023-03-27 (×4): 5 mg via INTRAVENOUS
  Administered 2023-03-27: 10 mg via INTRAVENOUS

## 2023-03-27 MED ORDER — FENTANYL CITRATE (PF) 100 MCG/2ML IJ SOLN
INTRAMUSCULAR | Status: AC
  Filled 2023-03-27: qty 2

## 2023-03-27 MED ORDER — ONDANSETRON HCL 4 MG/2ML IJ SOLN
INTRAMUSCULAR | Status: AC
  Filled 2023-03-27: qty 2

## 2023-03-27 MED ORDER — ACETAMINOPHEN 10 MG/ML IV SOLN
INTRAVENOUS | Status: DC | PRN
  Administered 2023-03-27: 1000 mg via INTRAVENOUS

## 2023-03-27 MED ORDER — SUGAMMADEX SODIUM 200 MG/2ML IV SOLN
INTRAVENOUS | Status: DC | PRN
  Administered 2023-03-27: 200 mg via INTRAVENOUS

## 2023-03-27 MED ORDER — CEFAZOLIN SODIUM-DEXTROSE 2-4 GM/100ML-% IV SOLN
2.0000 g | INTRAVENOUS | Status: AC
Start: 2023-03-27 — End: 2023-03-27
  Administered 2023-03-27: 2 g via INTRAVENOUS

## 2023-03-27 MED ORDER — OXYCODONE HCL 5 MG PO TABS
ORAL_TABLET | ORAL | Status: AC
  Filled 2023-03-27: qty 1

## 2023-03-27 MED ORDER — ONDANSETRON HCL 4 MG/2ML IJ SOLN
INTRAMUSCULAR | Status: DC | PRN
  Administered 2023-03-27: 4 mg via INTRAVENOUS

## 2023-03-27 MED ORDER — ROCURONIUM BROMIDE 10 MG/ML (PF) SYRINGE
PREFILLED_SYRINGE | INTRAVENOUS | Status: AC
  Filled 2023-03-27: qty 10

## 2023-03-27 MED ORDER — ACETAMINOPHEN 10 MG/ML IV SOLN
INTRAVENOUS | Status: AC
Start: 2023-03-27 — End: ?
  Filled 2023-03-27: qty 100

## 2023-03-27 MED ORDER — BUPIVACAINE-EPINEPHRINE 0.25% -1:200000 IJ SOLN
INTRAMUSCULAR | Status: DC | PRN
  Administered 2023-03-27: 30 mL

## 2023-03-27 MED ORDER — PROPOFOL 10 MG/ML IV BOLUS
INTRAVENOUS | Status: AC
  Filled 2023-03-27: qty 20

## 2023-03-27 MED ORDER — PROPOFOL 10 MG/ML IV BOLUS
INTRAVENOUS | Status: DC | PRN
  Administered 2023-03-27: 150 mg via INTRAVENOUS

## 2023-03-27 MED ORDER — LIDOCAINE HCL (PF) 2 % IJ SOLN
INTRAMUSCULAR | Status: AC
  Filled 2023-03-27: qty 5

## 2023-03-27 MED ORDER — FENTANYL CITRATE (PF) 100 MCG/2ML IJ SOLN: 25.0000 ug | INTRAMUSCULAR | Status: DC | PRN

## 2023-03-27 MED ORDER — EPHEDRINE 5 MG/ML INJ
INTRAVENOUS | Status: AC
  Filled 2023-03-27: qty 5

## 2023-03-27 MED ORDER — LACTATED RINGERS IV SOLN: INTRAVENOUS | Status: DC

## 2023-03-27 MED ORDER — MIDAZOLAM HCL 2 MG/2ML IJ SOLN
INTRAMUSCULAR | Status: DC | PRN
  Administered 2023-03-27: 1 mg via INTRAVENOUS

## 2023-03-27 SURGICAL SUPPLY — 44 items
BAG PRESSURE INF REUSE 1000 (BAG) IMPLANT
BLADE SURG SZ11 CARB STEEL (BLADE) ×1 IMPLANT
COVER TIP SHEARS 8 DVNC (MISCELLANEOUS) ×1 IMPLANT
COVER WAND RF STERILE (DRAPES) ×1 IMPLANT
DERMABOND ADVANCED .7 DNX12 (GAUZE/BANDAGES/DRESSINGS) ×1 IMPLANT
DRAPE ARM DVNC X/XI (DISPOSABLE) ×3 IMPLANT
DRAPE COLUMN DVNC XI (DISPOSABLE) ×1 IMPLANT
ELECT REM PT RETURN 9FT ADLT (ELECTROSURGICAL) ×1
ELECTRODE REM PT RTRN 9FT ADLT (ELECTROSURGICAL) ×1 IMPLANT
FORCEPS BPLR R/ABLATION 8 DVNC (INSTRUMENTS) ×1 IMPLANT
GLOVE BIO SURGEON STRL SZ 6.5 (GLOVE) ×2 IMPLANT
GLOVE BIOGEL PI IND STRL 6.5 (GLOVE) ×2 IMPLANT
GOWN STRL REUS W/ TWL LRG LVL3 (GOWN DISPOSABLE) ×3 IMPLANT
IRRIGATOR SUCT 8 DISP DVNC XI (IRRIGATION / IRRIGATOR) IMPLANT
IV CATH ANGIO 12GX3 LT BLUE (NEEDLE) IMPLANT
IV NS 1000ML BAXH (IV SOLUTION) IMPLANT
KIT PINK PAD W/HEAD ARE REST (MISCELLANEOUS) ×1
KIT PINK PAD W/HEAD ARM REST (MISCELLANEOUS) ×1 IMPLANT
LABEL OR SOLS (LABEL) IMPLANT
MANIFOLD NEPTUNE II (INSTRUMENTS) ×1 IMPLANT
MESH 3DMAX MID 5X7 RT XLRG (Mesh General) IMPLANT
NDL DRIVE SUT CUT DVNC (INSTRUMENTS) ×1 IMPLANT
NDL HYPO 22X1.5 SAFETY MO (MISCELLANEOUS) ×1 IMPLANT
NDL INSUFFLATION 14GA 120MM (NEEDLE) ×1 IMPLANT
NEEDLE DRIVE SUT CUT DVNC (INSTRUMENTS) ×1
NEEDLE HYPO 22X1.5 SAFETY MO (MISCELLANEOUS) ×1
NEEDLE INSUFFLATION 14GA 120MM (NEEDLE) ×1
OBTURATOR OPTICAL STND 8 DVNC (TROCAR) ×1
OBTURATOR OPTICALSTD 8 DVNC (TROCAR) ×1 IMPLANT
PACK LAP CHOLECYSTECTOMY (MISCELLANEOUS) ×1 IMPLANT
SCISSORS MNPLR CVD DVNC XI (INSTRUMENTS) ×1 IMPLANT
SEAL UNIV 5-12 XI (MISCELLANEOUS) ×3 IMPLANT
SET TUBE SMOKE EVAC HIGH FLOW (TUBING) ×1 IMPLANT
SOL ELECTROSURG ANTI STICK (MISCELLANEOUS) ×1
SOLUTION ELECTROSURG ANTI STCK (MISCELLANEOUS) ×1 IMPLANT
SUT MNCRL 4-0 27 PS-2 XMFL (SUTURE) ×1
SUT MNCRL 4-0 27XMFL (SUTURE) ×1
SUT STRATA 2-0 23CM CT-2 (SUTURE) ×1 IMPLANT
SUT VIC AB 2-0 SH 27XBRD (SUTURE) ×1 IMPLANT
SUTURE MNCRL 4-0 27XMF (SUTURE) ×1 IMPLANT
TAPE TRANSPORE STRL 2 31045 (GAUZE/BANDAGES/DRESSINGS) IMPLANT
TRAP FLUID SMOKE EVACUATOR (MISCELLANEOUS) ×1 IMPLANT
TRAY FOLEY MTR SLVR 16FR STAT (SET/KITS/TRAYS/PACK) ×1 IMPLANT
WATER STERILE IRR 500ML POUR (IV SOLUTION) ×1 IMPLANT

## 2023-03-27 NOTE — Op Note (Signed)
Preoperative diagnosis: Right recurrent inguinal hernia.   Postoperative diagnosis: Right recurrent inguinal hernia.  Procedure: Robotic assisted Laparoscopic Transabdominal preperitoneal laparoscopic (TAPP) repair of right recurrent inguinal hernia.  Anesthesia: GETA  Surgeon: Dr. Hazle Quant  Wound Classification: Clean  Indications:  Patient is a 72 y.o. male developed a recurrent right inguinal hernia. Repair was indicated.  Findings: 1. Right direct Inguinal hernia identified 2. Vas deferens and cord structures identified and preserved 3. Bard Extra Large 3D Max MID Anatomical mesh used for repair 4. Adequate hemostasis.   Description of procedure:  The patient was taken to the operating room and the correct side of surgery was verified. The patient was placed supine with right arm tucked at the side. After obtaining adequate anesthesia, the patient's abdomen was prepped and draped in standard sterile fashion. A time-out was completed verifying correct patient, procedure, site, positioning, and implant(s) and/or special equipment prior to beginning this procedure.  An incision was made in a natural skin line above the umbilicus. The fascia was elevated and the Veress needle inserted. Proper position was confirmed by aspiration and saline meniscus test.  The abdomen was insufflated with carbon dioxide to a pressure of 15 mmHg. The patient tolerated insufflation well.  Abdominal cavity was entered using Optiview technique with a millimeter trocar.  No injury was identified.  Another 2 mm trocars were placed lateral to each rectus muscle.  Scissors and bipolar forceps were inserted under direct visualization. At the robotic console: Transverse peritoneal incision is made about 8 cm superior to the inguinal defect. Medial to the epigastric vessels, the parietal compartment is dissected to visualize the rectus muscle. This is carried down to the symphysis pubis and the retropubic space is  dissected to expose at least 2 cm contralateral to the midline. Cooper's ligament is exposed and cleared at least 2 cm below the ligament to ensure adequate space for the inferior border of the mesh. Hesselbach's triangle is cleared assessing for a direct hernia. The hernia is reduced dissecting the contents away from the border of the transversalis (white) fascia. The pseudosac was imbricated onto Cooper's ligament in lieau of closure of the direct defect to prevent injury to the anterior nerves and cord structures. Lateral to the epigastric vessels, the dissection is carried out in visceral compartment continuing in the true preperitoneal plane. This dissection was continued until the cord structures are "parietalized" completely, allowing for visualization of the reflected peritoneum that is continuous with the line originating 2 cm below Coopers medially and across the psoas muscle in the lateral compartment.  The internal ring was interrogated for a cord lipoma. The cord lipoma was reduced to the retroperitoneum and seated dorsal to the preperitoneal mesh. Having achieved a complete dissection with a critical view of the entire myopectineal orifice, an XL mesh was then positioned centered at the iliopubic tract with the medial side crossing the midline and the inferior edge positioned 2 cm below Coopers ligament. The lateral aspect of the mesh extended 3-5 cm beyond the lateral edge of the psoas. The mesh is fixated using an interrupted suture placed to the ipsilateral Coopers ligament. A second suture was done at the medial superior aspect of the mesh fixating this to the rectus complex.  The peritoneal flap is closed with running barbed suture. Additional holes in the peritoneum closed with suture. Preperitoneal space gas aspirated to visualize the peritoneum apposed directly against the mesh and ensure no folding, lifting, or buckling of the mesh. Skin is closed, sterile  dressings are applied.  The  patient tolerated the procedure well and was taken to the postanesthesia care unit in stable condition  Specimen: None  Complications: None  Estimated Blood Loss: 10 mL

## 2023-03-27 NOTE — Anesthesia Postprocedure Evaluation (Signed)
Anesthesia Post Note  Patient: Donald Lee  Procedure(s) Performed: XI ROBOTIC ASSISTED BILATERAL INGUINAL HERNIA (Bilateral: Inguinal)  Patient location during evaluation: PACU Anesthesia Type: General Level of consciousness: awake and alert Pain management: pain level controlled Vital Signs Assessment: post-procedure vital signs reviewed and stable Respiratory status: spontaneous breathing, nonlabored ventilation, respiratory function stable and patient connected to nasal cannula oxygen Cardiovascular status: blood pressure returned to baseline and stable Postop Assessment: no apparent nausea or vomiting Anesthetic complications: no   No notable events documented.   Last Vitals:  Vitals:   03/27/23 1215 03/27/23 1232  BP:  131/69  Pulse: 68 66  Resp: (!) 22 20  Temp: (!) 36.2 C (!) 36.2 C  SpO2: 94% 96%    Last Pain:  Vitals:   03/27/23 1232  TempSrc: Oral  PainSc: 2                  Cleda Mccreedy Davey Bergsma

## 2023-03-27 NOTE — Anesthesia Preprocedure Evaluation (Signed)
Anesthesia Evaluation  Patient identified by MRN, date of birth, ID band Patient awake    Reviewed: Allergy & Precautions, NPO status , Patient's Chart, lab work & pertinent test results  History of Anesthesia Complications Negative for: history of anesthetic complications  Airway Mallampati: III  TM Distance: <3 FB Neck ROM: full    Dental  (+) Chipped, Poor Dentition, Missing   Pulmonary neg shortness of breath, sleep apnea , COPD, Current Smoker and Patient abstained from smoking.   Pulmonary exam normal        Cardiovascular hypertension, (-) Past MI and (-) DVT Normal cardiovascular exam     Neuro/Psych  PSYCHIATRIC DISORDERS       Neuromuscular disease    GI/Hepatic negative GI ROS, Neg liver ROS,,,  Endo/Other  Hypothyroidism    Renal/GU      Musculoskeletal   Abdominal   Peds  Hematology negative hematology ROS (+)   Anesthesia Other Findings Past Medical History: No date: Benign localized hyperplasia of prostate with urinary  obstruction No date: Depressive disorder No date: Dyslipidemia No date: Hyperlipidemia No date: Hypertension No date: Hypogammaglobulinemia (HCC) No date: Hypothyroidism No date: Hypothyroidism No date: OSA on CPAP No date: Primary osteoarthritis of left foot No date: Spinal stenosis of lumbar region with neurogenic claudication  Past Surgical History: No date: APPENDECTOMY No date: BACK SURGERY No date: FOOT SURGERY No date: HERNIA REPAIR; Right 08/13/2021: SHOULDER ARTHROSCOPY WITH SUBACROMIAL DECOMPRESSION AND  OPEN ROTATOR C; Left     Comment:  Procedure: Left shoulder arthroscopic biceps tenodesis               with subacromial decompression and mini open rotator cuff              repair;  Surgeon: Signa Kell, MD;  Location: [facility]               [facility];  Service: Orthopedics;  Laterality: Left; No date: THYROIDECTOMY, PARTIAL No date: VASECTOMY      Reproductive/Obstetrics negative OB ROS                             Anesthesia Physical Anesthesia Plan  ASA: 3  Anesthesia Plan: General ETT   Post-op Pain Management:    Induction: Intravenous  PONV Risk Score and Plan: Ondansetron, Dexamethasone, Midazolam and Treatment may vary due to age or medical condition  Airway Management Planned: Oral ETT  Additional Equipment:   Intra-op Plan:   Post-operative Plan: Extubation in OR  Informed Consent: I have reviewed the patients History and Physical, chart, labs and discussed the procedure including the risks, benefits and alternatives for the proposed anesthesia with the patient or authorized representative who has indicated his/her understanding and acceptance.     Dental Advisory Given  Plan Discussed with: Anesthesiologist, CRNA and Surgeon  Anesthesia Plan Comments: (Patient consented for risks of anesthesia including but not limited to:  - adverse reactions to medications - damage to eyes, teeth, lips or other oral mucosa - nerve damage due to positioning  - sore throat or hoarseness - Damage to heart, brain, nerves, lungs, other parts of body or loss of life  Patient voiced understanding and assent.)       Anesthesia Quick Evaluation

## 2023-03-27 NOTE — Interval H&P Note (Signed)
History and Physical Interval Note:  03/27/2023 9:14 AM  Donald Lee  has presented today for surgery, with the diagnosis of K40.91 recurrent unilateral inguinal hernia w/o obstruction or gangrene.  The various methods of treatment have been discussed with the patient and family. After consideration of risks, benefits and other options for treatment, the patient has consented to  Procedure(s): XI ROBOTIC ASSISTED BILATERAL INGUINAL HERNIA (Bilateral) as a surgical intervention.  The patient's history has been reviewed, patient examined, no change in status, stable for surgery.  I have reviewed the patient's chart and labs.  Questions were answered to the patient's satisfaction.     Carolan Shiver

## 2023-03-27 NOTE — Discharge Instructions (Signed)

## 2023-03-27 NOTE — Transfer of Care (Signed)
Immediate Anesthesia Transfer of Care Note  Patient: Donald Lee  Procedure(s) Performed: XI ROBOTIC ASSISTED BILATERAL INGUINAL HERNIA (Bilateral: Inguinal)  Patient Location: PACU  Anesthesia Type:General  Level of Consciousness: awake, alert , and oriented  Airway & Oxygen Therapy: Patient Spontanous Breathing  Post-op Assessment: Report given to RN and Post -op Vital signs reviewed and stable  Post vital signs: Reviewed and stable  Last Vitals:  Vitals Value Taken Time  BP 137/68 03/27/23 1135  Temp    Pulse 74 03/27/23 1137  Resp 24 03/27/23 1137  SpO2 100 % 03/27/23 1137  Vitals shown include unfiled device data.  Last Pain:  Vitals:   03/27/23 0858  TempSrc: Temporal  PainSc: 0-No pain         Complications: No notable events documented.

## 2023-03-27 NOTE — Anesthesia Procedure Notes (Signed)
Procedure Name: Intubation Date/Time: 03/27/2023 9:43 AM  Performed by: Elisabeth Pigeon, CRNAPre-anesthesia Checklist: Patient identified, Patient being monitored, Timeout performed, Emergency Drugs available and Suction available Patient Re-evaluated:Patient Re-evaluated prior to induction Oxygen Delivery Method: Circle system utilized Preoxygenation: Pre-oxygenation with 100% oxygen Induction Type: IV induction Ventilation: Mask ventilation without difficulty Laryngoscope Size: Mac, McGrath and 4 Grade View: Grade I Tube type: Oral Tube size: 7.0 mm Number of attempts: 1 Airway Equipment and Method: Stylet Placement Confirmation: ETT inserted through vocal cords under direct vision, positive ETCO2 and breath sounds checked- equal and bilateral Secured at: 22 cm Tube secured with: Tape Dental Injury: Teeth and Oropharynx as per pre-operative assessment

## 2023-04-08 ENCOUNTER — Emergency Department: Payer: Medicare Other

## 2023-04-08 ENCOUNTER — Encounter: Payer: Self-pay | Admitting: Radiology

## 2023-04-08 ENCOUNTER — Emergency Department
Admission: EM | Admit: 2023-04-08 | Discharge: 2023-04-08 | Disposition: A | Discharge status: Home / Self Care | Payer: Medicare Other | Attending: Emergency Medicine | Admitting: Emergency Medicine

## 2023-04-08 ENCOUNTER — Other Ambulatory Visit: Payer: Self-pay

## 2023-04-08 DIAGNOSIS — R1013 Epigastric pain: Secondary | ICD-10-CM | POA: Diagnosis not present

## 2023-04-08 DIAGNOSIS — K9189 Other postprocedural complications and disorders of digestive system: Secondary | ICD-10-CM

## 2023-04-08 DIAGNOSIS — R110 Nausea: Secondary | ICD-10-CM | POA: Insufficient documentation

## 2023-04-08 DIAGNOSIS — R109 Unspecified abdominal pain: Secondary | ICD-10-CM | POA: Diagnosis present

## 2023-04-08 LAB — COMPREHENSIVE METABOLIC PANEL
ALT: 22 U/L (ref 0–44)
AST: 17 U/L (ref 15–41)
Albumin: 2.9 g/dL — ABNORMAL LOW (ref 3.5–5.0)
Alkaline Phosphatase: 56 U/L (ref 38–126)
Anion gap: 9 (ref 5–15)
BUN: 31 mg/dL — ABNORMAL HIGH (ref 8–23)
CO2: 24 mmol/L (ref 22–32)
Calcium: 8.2 mg/dL — ABNORMAL LOW (ref 8.9–10.3)
Chloride: 102 mmol/L (ref 98–111)
Creatinine, Ser: 1.21 mg/dL (ref 0.61–1.24)
GFR, Estimated: >60 mL/min (ref >60)
Glucose, Bld: 102 mg/dL — ABNORMAL HIGH (ref 70–99)
Potassium: 4.3 mmol/L (ref 3.5–5.1)
Sodium: 135 mmol/L (ref 135–145)
Total Bilirubin: 0.6 mg/dL (ref <1.2)
Total Protein: 5.8 g/dL — ABNORMAL LOW (ref 6.5–8.1)

## 2023-04-08 LAB — CBC
HCT: 34.1 % — ABNORMAL LOW (ref 39.0–52.0)
Hemoglobin: 11.5 g/dL — ABNORMAL LOW (ref 13.0–17.0)
MCH: 33.2 pg (ref 26.0–34.0)
MCHC: 33.7 g/dL (ref 30.0–36.0)
MCV: 98.6 fL (ref 80.0–100.0)
Platelets: 365 10*3/uL (ref 150–400)
RBC: 3.46 MIL/uL — ABNORMAL LOW (ref 4.22–5.81)
RDW: 12.3 % (ref 11.5–15.5)
WBC: 11 10*3/uL — ABNORMAL HIGH (ref 4.0–10.5)
nRBC: 0 % (ref 0.0–0.2)

## 2023-04-08 LAB — LIPASE, BLOOD: Lipase: 105 U/L — ABNORMAL HIGH (ref 11–51)

## 2023-04-08 MED ORDER — IOHEXOL 300 MG/ML  SOLN
100.0000 mL | Freq: Once | INTRAMUSCULAR | Status: AC | PRN
  Administered 2023-04-08: 100 mL via INTRAVENOUS

## 2023-04-08 MED ORDER — SODIUM CHLORIDE 0.9 % IV BOLUS
500.0000 mL | Freq: Once | INTRAVENOUS | Status: AC
  Administered 2023-04-08: 500 mL via INTRAVENOUS

## 2023-04-08 MED ORDER — ONDANSETRON HCL 4 MG/2ML IJ SOLN
4.0000 mg | Freq: Once | INTRAMUSCULAR | Status: AC
  Administered 2023-04-08: 4 mg via INTRAVENOUS
  Filled 2023-04-08: qty 2

## 2023-04-08 NOTE — ED Notes (Signed)
Dr Anner Crete at bedside, pt stating has been passing gas and little bits of stool. Last BM this morning. Pt also stating having nausea but has not thrown up yet.

## 2023-04-08 NOTE — ED Notes (Signed)
Pt stating pain mostly on Left side of abdomen.

## 2023-04-08 NOTE — Discharge Instructions (Signed)
Follow-up on Friday as scheduled.  Drink plenty of fluids, but otherwise take clear liquids, broth, and an otherwise light diet for the next couple of days.  Return to the ER for new, worsening, or persistent severe abdominal pain, swelling, if you stop passing gas or having bowel movements, if you start vomiting, or any other new or worsening symptoms that concern you.

## 2023-04-08 NOTE — ED Notes (Signed)
Pt taken to CT.

## 2023-04-08 NOTE — ED Notes (Addendum)
Per wife, pt got up to use restroom and had small amount of diarrhea. Will attempt to obtain urine sample next time.

## 2023-04-08 NOTE — ED Provider Notes (Signed)
[facility] Provider Note    Event Date/Time   First MD Initiated Contact with Patient 04/08/23 1341     (approximate)   History   Abdominal Pain   HPI Donald Lee is a 72 y.o. male with recent history of bilateral inguinal hernia repair on 03/27/2023 presenting today for abdominal pain.  Patient notes over the past 4 days he has had worsening epigastric and left-sided abdominal pain.  He has had associated nausea but no vomiting.  Still having small bowel movements and passing gas.  Otherwise denies fever, chills, chest pain, shortness of breath.  No diarrhea.  Chart review: Reviewed notes from recent hernia repair surgery.     Physical Exam   Triage Vital Signs: ED Triage Vitals  Encounter Vitals Group     BP 04/08/23 1255 127/66     Systolic BP Percentile --      Diastolic BP Percentile --      Pulse Rate 04/08/23 1255 70     Resp 04/08/23 1255 18     Temp 04/08/23 1255 97.6 F (36.4 C)     Temp Source 04/08/23 1255 Oral     SpO2 04/08/23 1255 96 %     Weight 04/08/23 1303 209 lb (94.8 kg)     Height 04/08/23 1303 5\' 11"  (1.803 m)     Head Circumference --      Peak Flow --      Pain Score 04/08/23 1303 7     Pain Loc --      Pain Education --      Exclude from Growth Chart --     Most recent vital signs: Vitals:   04/08/23 1255 04/08/23 1410  BP: 127/66 126/60  Pulse: 70 60  Resp: 18 18  Temp: 97.6 F (36.4 C)   SpO2: 96% 97%   Physical Exam: I have reviewed the vital signs and nursing notes. General: Awake, alert, no acute distress.  Nontoxic appearing. Head:  Atraumatic, normocephalic.   ENT:  EOM intact, PERRL. Oral mucosa is pink and moist with no lesions. Neck: Neck is supple with full range of motion, No meningeal signs. Cardiovascular:  RRR, No murmurs. Peripheral pulses palpable and equal bilaterally. Respiratory:  Symmetrical chest wall expansion.  No rhonchi, rales, or wheezes.  Good air movement throughout.   No use of accessory muscles.   Musculoskeletal:  No cyanosis or edema. Moving extremities with full ROM Abdomen:  Soft, distended with tenderness palpation in the epigastric and left upper quadrant regions Neuro:  GCS 15, moving all four extremities, interacting appropriately. Speech clear. Psych:  Calm, appropriate.   Skin:  Warm, dry, no rash.    ED Results / Procedures / Treatments   Labs (all labs ordered are listed, but only abnormal results are displayed) Labs Reviewed  LIPASE, BLOOD  COMPREHENSIVE METABOLIC PANEL  CBC  URINALYSIS, ROUTINE W REFLEX MICROSCOPIC     EKG My EKG interpretation: Rate of 68, normal sinus rhythm with occasional PAC.  Normal axis, normal intervals.  No acute ST elevations or depressions   RADIOLOGY CT pending at time of signout   PROCEDURES:  Critical Care performed: No  Procedures   MEDICATIONS ORDERED IN ED: Medications  ondansetron (ZOFRAN) injection 4 mg (4 mg Intravenous Given 04/08/23 1414)     IMPRESSION / MDM / ASSESSMENT AND PLAN / ED COURSE  I reviewed the triage vital signs and the nursing notes.  Differential diagnosis includes, but is not limited to, small bowel obstruction, enteritis, colitis, pancreatitis, diverticulitis, ileus  Patient's presentation is most consistent with acute complicated illness / injury requiring diagnostic workup.  Patient is a 72 year old male presenting today for abdominal pain associated with nausea and decreased bowel movements in the setting of recent abdominal surgery.  Vital signs are stable and physical exam only notable for slight tenderness to the epigastric and left-sided abdomen.  Some distention noted.  Will give Zofran and evaluate with laboratory workup and CT abdomen/pelvis.  Patient signed out pending results of laboratory workup and CT abdomen/pelvis for ultimate disposition.  The patient is on the cardiac monitor to evaluate for evidence of  arrhythmia and/or significant heart rate changes.     FINAL CLINICAL IMPRESSION(S) / ED DIAGNOSES   Final diagnoses:  Epigastric pain  Nausea     Rx / DC Orders   ED Discharge Orders     None        Note:  This document was prepared using Dragon voice recognition software and may include unintentional dictation errors.   Janith Lima, MD 04/08/23 [phone]

## 2023-04-08 NOTE — ED Triage Notes (Signed)
Pt arrives via POV with upper abd pain. Pt sts the he had hernia surgery on Dec 13th and since than he has been having nausea and a rigid abd.

## 2023-04-08 NOTE — ED Provider Notes (Signed)
-----------------------------------------   5:14 PM on 04/08/2023 -----------------------------------------  I took over care on this patient Dr. Anner Crete.  I independently viewed and interpreted the CT which shows some mildly dilated bowel loops but no free air, free fluid, or transition point.  Radiology report indicates ileus:  IMPRESSION:  Mild diffuse small bowel and colonic dilatation, most consistent  with ileus.    Thickening of the anterior bladder wall, likely related to recent  inguinal hernia repair.    Colonic diverticulosis, without radiographic evidence of  diverticulitis.   On reassessment, the patient appears well.  I counseled him on the results of the workup.  The patient is passing gas and having bowel movements, and actually had an episode of diarrhea prior to my reassessment.  He has no vomiting and has been tolerating p.o. without difficulty.  He is eager to go home.  I consulted and discussed case with Dr. Everlene Farrier from general surgery.  He recommends giving the patient some IV fluids but otherwise advises that he is appropriate for discharge home with outpatient follow-up.  I counseled the patient on the surgery recommendations.  He has follow-up with surgery scheduled for 2 days from now.  I advised him to take a light diet for bowel rest, and gave him strict return precautions; he expressed understanding and agreement with the plan.   Dionne Bucy, MD 04/08/23 [phone]

## 2023-05-12 ENCOUNTER — Encounter
Admission: RE | Disposition: A | Discharge status: Home / Self Care | Payer: Self-pay | Source: Home / Self Care | Attending: Internal Medicine

## 2023-05-12 ENCOUNTER — Ambulatory Visit
Admission: RE | Admit: 2023-05-12 | Discharge: 2023-05-12 | Disposition: A | Discharge status: Home / Self Care | Payer: Medicare Other | Attending: Internal Medicine | Admitting: Internal Medicine

## 2023-05-12 ENCOUNTER — Ambulatory Visit: Payer: Medicare Other | Admitting: Anesthesiology

## 2023-05-12 ENCOUNTER — Encounter: Payer: Self-pay | Admitting: Internal Medicine

## 2023-05-12 ENCOUNTER — Other Ambulatory Visit: Payer: Self-pay

## 2023-05-12 DIAGNOSIS — K2950 Unspecified chronic gastritis without bleeding: Secondary | ICD-10-CM | POA: Diagnosis not present

## 2023-05-12 DIAGNOSIS — K591 Functional diarrhea: Secondary | ICD-10-CM | POA: Insufficient documentation

## 2023-05-12 DIAGNOSIS — K3189 Other diseases of stomach and duodenum: Secondary | ICD-10-CM | POA: Diagnosis not present

## 2023-05-12 DIAGNOSIS — K2970 Gastritis, unspecified, without bleeding: Secondary | ICD-10-CM | POA: Insufficient documentation

## 2023-05-12 DIAGNOSIS — I10 Essential (primary) hypertension: Secondary | ICD-10-CM | POA: Insufficient documentation

## 2023-05-12 DIAGNOSIS — F17200 Nicotine dependence, unspecified, uncomplicated: Secondary | ICD-10-CM | POA: Insufficient documentation

## 2023-05-12 DIAGNOSIS — G4733 Obstructive sleep apnea (adult) (pediatric): Secondary | ICD-10-CM | POA: Insufficient documentation

## 2023-05-12 DIAGNOSIS — K2980 Duodenitis without bleeding: Secondary | ICD-10-CM | POA: Insufficient documentation

## 2023-05-12 DIAGNOSIS — K254 Chronic or unspecified gastric ulcer with hemorrhage: Secondary | ICD-10-CM | POA: Insufficient documentation

## 2023-05-12 DIAGNOSIS — K5730 Diverticulosis of large intestine without perforation or abscess without bleeding: Secondary | ICD-10-CM | POA: Diagnosis not present

## 2023-05-12 DIAGNOSIS — K269 Duodenal ulcer, unspecified as acute or chronic, without hemorrhage or perforation: Secondary | ICD-10-CM | POA: Insufficient documentation

## 2023-05-12 HISTORY — PX: BIOPSY: SHX5522

## 2023-05-12 HISTORY — PX: COLONOSCOPY WITH PROPOFOL: SHX5780

## 2023-05-12 HISTORY — PX: ESOPHAGOGASTRODUODENOSCOPY (EGD) WITH PROPOFOL: SHX5813

## 2023-05-12 SURGERY — COLONOSCOPY WITH PROPOFOL
Anesthesia: General

## 2023-05-12 MED ORDER — PROPOFOL 1000 MG/100ML IV EMUL
INTRAVENOUS | Status: AC
  Filled 2023-05-12: qty 100

## 2023-05-12 MED ORDER — LIDOCAINE HCL (CARDIAC) PF 100 MG/5ML IV SOSY
PREFILLED_SYRINGE | INTRAVENOUS | Status: DC | PRN
  Administered 2023-05-12: 80 mg via INTRAVENOUS

## 2023-05-12 MED ORDER — LIDOCAINE HCL (PF) 2 % IJ SOLN
INTRAMUSCULAR | Status: AC
  Filled 2023-05-12: qty 5

## 2023-05-12 MED ORDER — PROPOFOL 500 MG/50ML IV EMUL
INTRAVENOUS | Status: DC | PRN
  Administered 2023-05-12: 150 ug/kg/min via INTRAVENOUS

## 2023-05-12 MED ORDER — PROPOFOL 10 MG/ML IV BOLUS
INTRAVENOUS | Status: DC | PRN
  Administered 2023-05-12: 100 mg via INTRAVENOUS

## 2023-05-12 MED ORDER — SODIUM CHLORIDE 0.9 % IV SOLN: INTRAVENOUS | Status: DC

## 2023-05-12 NOTE — H&P (Signed)
Outpatient short stay form Pre-procedure 05/12/2023 12:30 PM Jannat Rosemeyer K. Norma Fredrickson, M.D.  Primary Physician: Laren Everts, NP  Reason for visit:  Epigastric pain, nausea, change in bowel habits  History of present illness: 73 y/o male with a PMH of obesity, HTN, mixed HLD, postablative hypothyroidism, OSA on CPAP, prediabetes, depression, anxiety, OA, RLS, and mild intermittent asthma presents to the [facility] for chief complaint of chronic diarrhea. Chronic nausea  Epigastric abdominal pain  Abdominal bloating   Suspect his GI symptoms are in large part 2/2 medication side effects from GLP-1 therapy (Zepbound 15 mg weekly). Other differential diagnosis includes gastritis +/- NSAID-induced, PUD, duodenitis, functional dyspepsia, gastroparesis, occult GI malignancy, esophagitis, IBS, etc - Recent cross-sectional imaging last month was negative for any acute findings to explain symptoms (other than mild ileus) - Advised patient to avoid NSAIDs if able     Current Facility-Administered Medications:    0.9 %  sodium chloride infusion, , Intravenous, Continuous, Alcides Nutting, Boykin Nearing, MD, Last Rate: 20 mL/hr at 05/12/23 1227, Continued from Pre-op at 05/12/23 1227  Medications Prior to Admission  Medication Sig Dispense Refill Last Dose/Taking   albuterol (VENTOLIN HFA) 108 (90 Base) MCG/ACT inhaler Inhale 2 puffs into the lungs every 6 (six) hours as needed for wheezing or shortness of breath.   Past Month   alfuzosin (UROXATRAL) 10 MG 24 hr tablet Take 10 mg by mouth daily.   05/11/2023   Cholecalciferol (VITAMIN D3) 50 MCG (2000 UT) capsule Take 2,000 Units by mouth daily.   Past Week   finasteride (PROSCAR) 5 MG tablet Take 1 tablet by mouth daily.   05/11/2023   levothyroxine (SYNTHROID, LEVOTHROID) 125 MCG tablet 112 mcg. Two tablets twice daily    05/12/2023 at  6:00 AM   Omega-3 Fatty Acids (FISH OIL) 1000 MG CAPS Take 2,000 mg by mouth daily.   Past Week   sertraline (ZOLOFT) 100 MG  tablet Take 100 mg by mouth daily.   05/11/2023   solifenacin (VESICARE) 10 MG tablet Take 10 mg by mouth daily.   05/11/2023   ZEPBOUND 15 MG/0.5ML Pen Inject 15 mg into the skin once a week. Takes on Tuesday   03/21/2023   atorvastatin (LIPITOR) 20 MG tablet Take 20 mg by mouth daily. (Patient not taking: Reported on 05/12/2023)   Not Taking   cetirizine (ZYRTEC) 10 MG tablet Take 10 mg by mouth daily.      diclofenac (VOLTAREN) 75 MG EC tablet Take 75 mg by mouth 2 (two) times daily. (Patient not taking: Reported on 05/12/2023)   Not Taking   oxyCODONE-acetaminophen (PERCOCET) 5-325 MG tablet Take 1 tablet by mouth every 4 (four) hours as needed for severe pain (pain score 7-10). 20 tablet 0    sildenafil (VIAGRA) 100 MG tablet Take 100 mg by mouth as needed for erectile dysfunction.        No Known Allergies   Past Medical History:  Diagnosis Date   Benign localized hyperplasia of prostate with urinary obstruction    Depressive disorder    Dyslipidemia    Hyperlipidemia    Hypertension    Hypogammaglobulinemia (HCC)    Hypothyroidism    Hypothyroidism    OSA on CPAP    Primary osteoarthritis of left foot    Spinal stenosis of lumbar region with neurogenic claudication     Review of systems:  Otherwise negative.    Physical Exam  Gen: Alert, oriented. Appears stated age.  HEENT: [place]/AT. PERRLA. Lungs: CTA, no  wheezes. CV: RR nl S1, S2. Abd: soft, benign, no masses. BS+ Ext: No edema. Pulses 2+    Planned procedures: Proceed with EGD and colonoscopy. The patient understands the nature of the planned procedure, indications, risks, alternatives and potential complications including but not limited to bleeding, infection, perforation, damage to internal organs and possible oversedation/side effects from anesthesia. The patient agrees and gives consent to proceed.  Please refer to procedure notes for findings, recommendations and patient disposition/instructions.     Elinore Shults  K. Norma Fredrickson, M.D. Gastroenterology 05/12/2023  12:30 PM

## 2023-05-12 NOTE — Anesthesia Postprocedure Evaluation (Signed)
Anesthesia Post Note  Patient: Caleb Decock Twitty  Procedure(s) Performed: COLONOSCOPY WITH PROPOFOL ESOPHAGOGASTRODUODENOSCOPY (EGD) WITH PROPOFOL BIOPSY  Patient location during evaluation: Endoscopy Anesthesia Type: General Level of consciousness: awake and alert Pain management: pain level controlled Vital Signs Assessment: post-procedure vital signs reviewed and stable Respiratory status: spontaneous breathing, nonlabored ventilation, respiratory function stable and patient connected to nasal cannula oxygen Cardiovascular status: blood pressure returned to baseline and stable Postop Assessment: no apparent nausea or vomiting Anesthetic complications: no   No notable events documented.   Last Vitals:  Vitals:   05/12/23 1208 05/12/23 1312  BP: (!) 159/81 130/68  Pulse: 75 66  Resp: 18 16  Temp: (!) 36.2 C (!) 35.8 C  SpO2: 97% 99%    Last Pain:  Vitals:   05/12/23 1312  TempSrc: Temporal  PainSc: Asleep                 Cleda Mccreedy Kevin Space

## 2023-05-12 NOTE — Anesthesia Preprocedure Evaluation (Signed)
Anesthesia Evaluation  Patient identified by MRN, date of birth, ID band Patient awake    Reviewed: Allergy & Precautions, NPO status , Patient's Chart, lab work & pertinent test results  History of Anesthesia Complications Negative for: history of anesthetic complications  Airway Mallampati: III  TM Distance: <3 FB Neck ROM: full    Dental  (+) Chipped   Pulmonary neg shortness of breath, sleep apnea , Current Smoker and Patient abstained from smoking.   Pulmonary exam normal        Cardiovascular Exercise Tolerance: Good hypertension, (-) angina Normal cardiovascular exam     Neuro/Psych  PSYCHIATRIC DISORDERS       Neuromuscular disease    GI/Hepatic negative GI ROS, Neg liver ROS,,,  Endo/Other  Hypothyroidism    Renal/GU negative Renal ROS  negative genitourinary   Musculoskeletal   Abdominal   Peds  Hematology negative hematology ROS (+)   Anesthesia Other Findings Past Medical History: No date: Benign localized hyperplasia of prostate with urinary  obstruction No date: Depressive disorder No date: Dyslipidemia No date: Hyperlipidemia No date: Hypertension No date: Hypogammaglobulinemia (HCC) No date: Hypothyroidism No date: Hypothyroidism No date: OSA on CPAP No date: Primary osteoarthritis of left foot No date: Spinal stenosis of lumbar region with neurogenic claudication  Past Surgical History: No date: APPENDECTOMY No date: BACK SURGERY No date: FOOT SURGERY No date: HERNIA REPAIR; Right 08/13/2021: SHOULDER ARTHROSCOPY WITH SUBACROMIAL DECOMPRESSION AND  OPEN ROTATOR C; Left     Comment:  Procedure: Left shoulder arthroscopic biceps tenodesis               with subacromial decompression and mini open rotator cuff              repair;  Surgeon: Signa Kell, MD;  Location: [facility]               [facility];  Service: Orthopedics;  Laterality: Left; No date: THYROIDECTOMY, PARTIAL No date:  VASECTOMY  BMI    Body Mass Index: 25.80 kg/m      Reproductive/Obstetrics negative OB ROS                             Anesthesia Physical Anesthesia Plan  ASA: 3  Anesthesia Plan: General   Post-op Pain Management:    Induction: Intravenous  PONV Risk Score and Plan: Propofol infusion and TIVA  Airway Management Planned: Natural Airway and Nasal Cannula  Additional Equipment:   Intra-op Plan:   Post-operative Plan:   Informed Consent: I have reviewed the patients History and Physical, chart, labs and discussed the procedure including the risks, benefits and alternatives for the proposed anesthesia with the patient or authorized representative who has indicated his/her understanding and acceptance.     Dental Advisory Given  Plan Discussed with: Anesthesiologist, CRNA and Surgeon  Anesthesia Plan Comments: (Patient consented for risks of anesthesia including but not limited to:  - adverse reactions to medications - risk of airway placement if required - damage to eyes, teeth, lips or other oral mucosa - nerve damage due to positioning  - sore throat or hoarseness - Damage to heart, brain, nerves, lungs, other parts of body or loss of life  Patient voiced understanding and assent.)       Anesthesia Quick Evaluation

## 2023-05-12 NOTE — Op Note (Signed)
[facility] Gastroenterology Patient Name: Donald Lee Procedure Date: 05/12/2023 11:47 AM MRN: [id number] Account #: [id number] Date of Birth: 1950/07/02 Admit Type: Outpatient Age: 73 Room: [facility] ENDO ROOM 1 Gender: Male Note Status: Finalized Instrument Name: Upper Endoscope [id number] Procedure:             Upper GI endoscopy Indications:           Epigastric abdominal pain, Dyspepsia, Nausea Providers:             Boykin Nearing. Norma Fredrickson MD, MD Referring MD:          No Local Md, MD (Referring MD) Medicines:             Propofol per Anesthesia Complications:         No immediate complications. Estimated blood loss:                         Minimal. Procedure:             Pre-Anesthesia Assessment:                        - The risks and benefits of the procedure and the                         sedation options and risks were discussed with the                         patient. All questions were answered and informed                         consent was obtained.                        - Patient identification and proposed procedure were                         verified prior to the procedure by the nurse. The                         procedure was verified in the procedure room.                        - ASA Grade Assessment: III - A patient with severe                         systemic disease.                        - After reviewing the risks and benefits, the patient                         was deemed in satisfactory condition to undergo the                         procedure.                        After obtaining informed consent, the endoscope was  passed under direct vision. Throughout the procedure,                         the patient's blood pressure, pulse, and oxygen                         saturations were monitored continuously. The Endoscope                         was introduced through the mouth, and advanced to the                          third part of duodenum. The upper GI endoscopy was                         accomplished without difficulty. The patient tolerated                         the procedure well. Findings:      The esophagus was normal.      Scattered moderate inflammation characterized by congestion (edema),       erosions and erythema was found in the gastric antrum. Biopsies were       taken with a cold forceps for Helicobacter pylori testing.      One non-bleeding linear gastric ulcer with adherent clot was found in       the gastric antrum. The lesion was 18 mm in largest dimension.      The exam of the stomach was otherwise normal.      Multiple localized erosions without bleeding were found in the second       portion of the duodenum. Biopsies were taken with a cold forceps for       histology.      The first portion of the duodenum and third portion of the duodenum were       normal.      The exam was otherwise without abnormality. Impression:            - Normal esophagus.                        - Chronic gastritis. Biopsied.                        - Non-bleeding gastric ulcer with adherent clot.                        - Duodenal erosions without bleeding. Biopsied.                        - Normal first portion of the duodenum and third                         portion of the duodenum.                        - The examination was otherwise normal. Recommendation:        - Await pathology results.                        - Use Protonix (pantoprazole) 40 mg PO  daily.                        - No aspirin, ibuprofen, naproxen, or other                         non-steroidal anti-inflammatory drugs.                        - Proceed with colonoscopy Procedure Code(s):     --- Professional ---                        [phone], Esophagogastroduodenoscopy, flexible,                         transoral; with biopsy, single or multiple Diagnosis Code(s):     --- Professional ---                        R11.0,  Nausea                        R10.13, Epigastric pain                        K26.9, Duodenal ulcer, unspecified as acute or                         chronic, without hemorrhage or perforation                        K25.4, Chronic or unspecified gastric ulcer with                         hemorrhage                        K29.50, Unspecified chronic gastritis without bleeding CPT copyright 2022 American Medical Association. All rights reserved. The codes documented in this report are preliminary and upon coder review may  be revised to meet current compliance requirements. Stanton Kidney MD, MD 05/12/2023 12:55:46 PM This report has been signed electronically. Number of Addenda: 0 Note Initiated On: 05/12/2023 11:47 AM Estimated Blood Loss:  Estimated blood loss was minimal.      [facility]

## 2023-05-12 NOTE — Interval H&P Note (Signed)
History and Physical Interval Note:  05/12/2023 12:31 PM  Donald Lee  has presented today for surgery, with the diagnosis of K52.9 (ICD-10-CM) - Chronic diarrhea R19.4 (ICD-10-CM) - Change in bowel habits R63.4 (ICD-10-CM) - Weight loss R11.0 (ICD-10-CM) - Chronic nausea Z87.19 (ICD-10-CM) - History of ileus R10.13 (ICD-10-CM) - Epigastric pain R14.0 (ICD-10-CM) - Abdominal bloating Z79.1 (ICD-10-CM) - NSAID long-term use.  The various methods of treatment have been discussed with the patient and family. After consideration of risks, benefits and other options for treatment, the patient has consented to  Procedure(s): COLONOSCOPY WITH PROPOFOL (N/A) ESOPHAGOGASTRODUODENOSCOPY (EGD) WITH PROPOFOL (N/A) as a surgical intervention.  The patient's history has been reviewed, patient examined, no change in status, stable for surgery.  I have reviewed the patient's chart and labs.  Questions were answered to the patient's satisfaction.     [place], [place]

## 2023-05-12 NOTE — Op Note (Signed)
[facility] Gastroenterology Patient Name: Dion Sibal Procedure Date: 05/12/2023 11:46 AM MRN: [id number] Account #: [id number] Date of Birth: 12-05-1950 Admit Type: Outpatient Age: 73 Room: [facility] ENDO ROOM 1 Gender: Male Note Status: Finalized Instrument Name: Colonoscope [id number] Procedure:             Colonoscopy Indications:           Functional diarrhea Providers:             Boykin Nearing. Norma Fredrickson MD, MD Referring MD:          No Local Md, MD (Referring MD) Medicines:             Propofol per Anesthesia Complications:         No immediate complications. Estimated blood loss:                         Minimal. Procedure:             Pre-Anesthesia Assessment:                        - The risks and benefits of the procedure and the                         sedation options and risks were discussed with the                         patient. All questions were answered and informed                         consent was obtained.                        - Patient identification and proposed procedure were                         verified prior to the procedure by the nurse. The                         procedure was verified in the procedure room.                        - ASA Grade Assessment: III - A patient with severe                         systemic disease.                        - After reviewing the risks and benefits, the patient                         was deemed in satisfactory condition to undergo the                         procedure.                        After obtaining informed consent, the colonoscope was                         passed under direct vision. Throughout the  procedure,                         the patient's blood pressure, pulse, and oxygen                         saturations were monitored continuously. The                         Colonoscope was introduced through the anus and                         advanced to the the cecum, identified by  appendiceal                         orifice and ileocecal valve. The colonoscopy was                         somewhat difficult due to significant looping.                         Successful completion of the procedure was aided by                         applying abdominal pressure. The patient tolerated the                         procedure well. The quality of the bowel preparation                         was adequate. The ileocecal valve, appendiceal                         orifice, and rectum were photographed. Findings:      The perianal and digital rectal examinations were normal. Pertinent       negatives include normal sphincter tone and no palpable rectal lesions.      Many small-mouthed diverticula were found in the sigmoid colon. There       was no evidence of diverticular bleeding.      The exam was otherwise without abnormality.      Biopsies for histology were taken with a cold forceps from the random       colon for evaluation of microscopic colitis.      The exam was otherwise without abnormality on direct and retroflexion       views. Impression:            - Mild diverticulosis in the sigmoid colon. There was                         no evidence of diverticular bleeding.                        - The examination was otherwise normal.                        - The examination was otherwise normal on direct and                         retroflexion views.                        -  Biopsies were taken with a cold forceps from the                         random colon for evaluation of microscopic colitis. Recommendation:        - Await pathology results from EGD, also performed                         today.                        - Use Protonix (pantoprazole) 40 mg PO daily.                        - No aspirin, ibuprofen, naproxen, or other                         non-steroidal anti-inflammatory drugs.                        - Await pathology results.                         - Follow up with Jacob Moores, PA-C in the GI office.                         [phone]                        - Telephone GI office to schedule appointment in 3                         months.                        - The findings and recommendations were discussed with                         the patient. Procedure Code(s):     --- Professional ---                        [phone], Colonoscopy, flexible; with biopsy, single or                         multiple Diagnosis Code(s):     --- Professional ---                        K57.30, Diverticulosis of large intestine without                         perforation or abscess without bleeding                        K59.1, Functional diarrhea CPT copyright 2022 American Medical Association. All rights reserved. The codes documented in this report are preliminary and upon coder review may  be revised to meet current compliance requirements. Stanton Kidney MD, MD 05/12/2023 1:14:19 PM This report has been signed electronically. Number of Addenda: 0 Note Initiated On: 05/12/2023 11:46 AM Scope Withdrawal Time: 0 hours 5 minutes 57 seconds  Total Procedure Duration: 0 hours 12 minutes 58  seconds  Estimated Blood Loss:  Estimated blood loss was minimal.      [facility]

## 2023-05-12 NOTE — Transfer of Care (Signed)
Immediate Anesthesia Transfer of Care Note  Patient: Donald Lee  Procedure(s) Performed: COLONOSCOPY WITH PROPOFOL ESOPHAGOGASTRODUODENOSCOPY (EGD) WITH PROPOFOL BIOPSY  Patient Location: PACU  Anesthesia Type:General  Level of Consciousness: awake and sedated  Airway & Oxygen Therapy: Patient Spontanous Breathing and Patient connected to face mask oxygen  Post-op Assessment: Report given to RN and Post -op Vital signs reviewed and stable  Post vital signs: Reviewed and stable  Last Vitals:  Vitals Value Taken Time  BP    Temp    Pulse    Resp    SpO2      Last Pain:  Vitals:   05/12/23 1208  TempSrc: Temporal  PainSc: 0-No pain         Complications: No notable events documented.

## 2023-05-13 ENCOUNTER — Encounter: Payer: Self-pay | Admitting: Internal Medicine

## 2023-05-15 LAB — SURGICAL PATHOLOGY

## 2023-05-18 ENCOUNTER — Encounter (HOSPITAL_COMMUNITY): Payer: Self-pay

## 2023-05-18 ENCOUNTER — Ambulatory Visit
Admission: RE | Admit: 2023-05-18 | Discharge: 2023-05-18 | Disposition: A | Discharge status: Home / Self Care | Payer: Medicare Other | Source: Ambulatory Visit | Attending: Gastroenterology | Admitting: Gastroenterology

## 2023-05-18 ENCOUNTER — Other Ambulatory Visit: Payer: Self-pay | Admitting: Gastroenterology

## 2023-05-18 ENCOUNTER — Inpatient Hospital Stay: Admit: 2023-05-18 | Payer: Medicare Other | Admitting: Internal Medicine

## 2023-05-18 DIAGNOSIS — K529 Noninfective gastroenteritis and colitis, unspecified: Secondary | ICD-10-CM

## 2023-05-18 DIAGNOSIS — R1013 Epigastric pain: Secondary | ICD-10-CM

## 2023-05-18 DIAGNOSIS — K257 Chronic gastric ulcer without hemorrhage or perforation: Secondary | ICD-10-CM

## 2023-05-18 DIAGNOSIS — R109 Unspecified abdominal pain: Secondary | ICD-10-CM | POA: Diagnosis not present

## 2023-05-18 DIAGNOSIS — R1114 Bilious vomiting: Secondary | ICD-10-CM

## 2023-05-18 DIAGNOSIS — Z8719 Personal history of other diseases of the digestive system: Secondary | ICD-10-CM

## 2023-05-18 DIAGNOSIS — R110 Nausea: Secondary | ICD-10-CM

## 2023-05-18 DIAGNOSIS — K56609 Unspecified intestinal obstruction, unspecified as to partial versus complete obstruction: Secondary | ICD-10-CM | POA: Diagnosis not present

## 2023-05-18 MED ORDER — IOHEXOL 300 MG/ML  SOLN
100.0000 mL | Freq: Once | INTRAMUSCULAR | Status: AC | PRN
  Administered 2023-05-18: 100 mL via INTRAVENOUS

## 2023-05-19 ENCOUNTER — Inpatient Hospital Stay: Payer: Medicare Other

## 2023-05-19 ENCOUNTER — Inpatient Hospital Stay
Admission: EM | Admit: 2023-05-19 | Discharge: 2023-05-25 | DRG: 330 | Disposition: A | Discharge status: Home / Self Care | Payer: Medicare Other | Attending: Internal Medicine | Admitting: Internal Medicine

## 2023-05-19 ENCOUNTER — Encounter: Payer: Self-pay | Admitting: Emergency Medicine

## 2023-05-19 ENCOUNTER — Other Ambulatory Visit: Payer: Self-pay

## 2023-05-19 DIAGNOSIS — E860 Dehydration: Secondary | ICD-10-CM | POA: Diagnosis present

## 2023-05-19 DIAGNOSIS — K269 Duodenal ulcer, unspecified as acute or chronic, without hemorrhage or perforation: Secondary | ICD-10-CM | POA: Diagnosis present

## 2023-05-19 DIAGNOSIS — G4733 Obstructive sleep apnea (adult) (pediatric): Secondary | ICD-10-CM | POA: Diagnosis present

## 2023-05-19 DIAGNOSIS — R109 Unspecified abdominal pain: Secondary | ICD-10-CM | POA: Diagnosis present

## 2023-05-19 DIAGNOSIS — D801 Nonfamilial hypogammaglobulinemia: Secondary | ICD-10-CM | POA: Diagnosis present

## 2023-05-19 DIAGNOSIS — R100 Acute abdomen: Secondary | ICD-10-CM | POA: Diagnosis present

## 2023-05-19 DIAGNOSIS — K56609 Unspecified intestinal obstruction, unspecified as to partial versus complete obstruction: Principal | ICD-10-CM | POA: Diagnosis present

## 2023-05-19 DIAGNOSIS — F32A Depression, unspecified: Secondary | ICD-10-CM | POA: Diagnosis present

## 2023-05-19 DIAGNOSIS — K5730 Diverticulosis of large intestine without perforation or abscess without bleeding: Secondary | ICD-10-CM | POA: Diagnosis present

## 2023-05-19 DIAGNOSIS — K257 Chronic gastric ulcer without hemorrhage or perforation: Secondary | ICD-10-CM | POA: Diagnosis present

## 2023-05-19 DIAGNOSIS — Z79899 Other long term (current) drug therapy: Secondary | ICD-10-CM | POA: Diagnosis not present

## 2023-05-19 DIAGNOSIS — I10 Essential (primary) hypertension: Secondary | ICD-10-CM | POA: Diagnosis present

## 2023-05-19 DIAGNOSIS — K529 Noninfective gastroenteritis and colitis, unspecified: Secondary | ICD-10-CM | POA: Diagnosis present

## 2023-05-19 DIAGNOSIS — K2950 Unspecified chronic gastritis without bleeding: Secondary | ICD-10-CM | POA: Diagnosis present

## 2023-05-19 DIAGNOSIS — E039 Hypothyroidism, unspecified: Secondary | ICD-10-CM | POA: Diagnosis present

## 2023-05-19 DIAGNOSIS — E871 Hypo-osmolality and hyponatremia: Secondary | ICD-10-CM | POA: Diagnosis present

## 2023-05-19 DIAGNOSIS — N400 Enlarged prostate without lower urinary tract symptoms: Secondary | ICD-10-CM | POA: Diagnosis present

## 2023-05-19 DIAGNOSIS — F17290 Nicotine dependence, other tobacco product, uncomplicated: Secondary | ICD-10-CM | POA: Diagnosis present

## 2023-05-19 DIAGNOSIS — E785 Hyperlipidemia, unspecified: Secondary | ICD-10-CM | POA: Diagnosis present

## 2023-05-19 DIAGNOSIS — K567 Ileus, unspecified: Secondary | ICD-10-CM | POA: Diagnosis not present

## 2023-05-19 DIAGNOSIS — Z79890 Hormone replacement therapy (postmenopausal): Secondary | ICD-10-CM

## 2023-05-19 DIAGNOSIS — M19072 Primary osteoarthritis, left ankle and foot: Secondary | ICD-10-CM | POA: Diagnosis present

## 2023-05-19 LAB — COMPREHENSIVE METABOLIC PANEL
ALT: 19 U/L (ref 0–44)
AST: 21 U/L (ref 15–41)
Albumin: 2.8 g/dL — ABNORMAL LOW (ref 3.5–5.0)
Alkaline Phosphatase: 115 U/L (ref 38–126)
Anion gap: 9 (ref 5–15)
BUN: 32 mg/dL — ABNORMAL HIGH (ref 8–23)
CO2: 25 mmol/L (ref 22–32)
Calcium: 7.9 mg/dL — ABNORMAL LOW (ref 8.9–10.3)
Chloride: 98 mmol/L (ref 98–111)
Creatinine, Ser: 1.02 mg/dL (ref 0.61–1.24)
GFR, Estimated: >60 mL/min (ref >60)
Glucose, Bld: 108 mg/dL — ABNORMAL HIGH (ref 70–99)
Potassium: 3.9 mmol/L (ref 3.5–5.1)
Sodium: 132 mmol/L — ABNORMAL LOW (ref 135–145)
Total Bilirubin: 0.6 mg/dL (ref 0.0–1.2)
Total Protein: 4.9 g/dL — ABNORMAL LOW (ref 6.5–8.1)

## 2023-05-19 LAB — CBC WITH DIFFERENTIAL/PLATELET
Abs Immature Granulocytes: 0.02 10*3/uL (ref 0.00–0.07)
Basophils Absolute: 0 10*3/uL (ref 0.0–0.1)
Basophils Relative: 1 %
Eosinophils Absolute: 0.1 10*3/uL (ref 0.0–0.5)
Eosinophils Relative: 1 %
HCT: 35.1 % — ABNORMAL LOW (ref 39.0–52.0)
Hemoglobin: 12.2 g/dL — ABNORMAL LOW (ref 13.0–17.0)
Immature Granulocytes: 0 %
Lymphocytes Relative: 17 %
Lymphs Abs: 1 10*3/uL (ref 0.7–4.0)
MCH: 33 pg (ref 26.0–34.0)
MCHC: 34.8 g/dL (ref 30.0–36.0)
MCV: 94.9 fL (ref 80.0–100.0)
Monocytes Absolute: 0.6 10*3/uL (ref 0.1–1.0)
Monocytes Relative: 9 %
Neutro Abs: 4.4 10*3/uL (ref 1.7–7.7)
Neutrophils Relative %: 72 %
Platelets: 281 10*3/uL (ref 150–400)
RBC: 3.7 MIL/uL — ABNORMAL LOW (ref 4.22–5.81)
RDW: 13.1 % (ref 11.5–15.5)
WBC: 6.1 10*3/uL (ref 4.0–10.5)
nRBC: 0 % (ref 0.0–0.2)

## 2023-05-19 MED ORDER — DEXTROSE 5 % AND 0.45 % NACL IV BOLUS
2000.0000 mL | Freq: Once | INTRAVENOUS | Status: AC
  Administered 2023-05-19: 2000 mL via INTRAVENOUS
  Filled 2023-05-19: qty 2000

## 2023-05-19 MED ORDER — ONDANSETRON HCL 4 MG/2ML IJ SOLN
4.0000 mg | Freq: Four times a day (QID) | INTRAMUSCULAR | Status: DC | PRN
  Administered 2023-05-22 – 2023-05-24 (×3): 4 mg via INTRAVENOUS
  Filled 2023-05-19 (×3): qty 2

## 2023-05-19 MED ORDER — DIATRIZOATE MEGLUMINE & SODIUM 66-10 % PO SOLN
90.0000 mL | Freq: Once | ORAL | Status: AC
  Administered 2023-05-19: 90 mL via NASOGASTRIC

## 2023-05-19 MED ORDER — MORPHINE SULFATE (PF) 2 MG/ML IV SOLN
2.0000 mg | INTRAVENOUS | Status: DC | PRN
  Administered 2023-05-19 – 2023-05-20 (×3): 2 mg via INTRAVENOUS
  Filled 2023-05-19 (×3): qty 1

## 2023-05-19 NOTE — ED Provider Notes (Signed)
 [facility] Provider Note    Event Date/Time   First MD Initiated Contact with Patient 05/19/23 [phone]     (approximate)   History   No chief complaint on file.   HPI  Donald Lee is a 73 year old male with history of HTN, chronic gastric ulcer, inguinal hernia repair on 03/27/2023 with Dr. Cesar presenting to the emergency department as a transfer from [facility] for evaluation of small bowel obstruction.  For the past few weeks, patient has had ongoing nausea with decreased p.o. intake.  2 days ago, patient had multiple episodes of emesis that he reports looked like diarrhea.  He had a CT ordered by his GI doctor demonstrating a high-grade distal small bowel obstruction.  After discussion with multiple providers, the patient was accepted in transfer to our facility as his hernia surgery was performed here.  Patient reports he has not had an episode of vomiting in 2 days.  No bowel movement for the past 4 to 5 days, but has had very minimal p.o. intake.  Outside hospital reviewed, WBC 6.1, hemoglobin 12.7, electrolytes without critical derangements, LFTs without critical derangements.      Physical Exam   Triage Vital Signs: ED Triage Vitals  Encounter Vitals Group     BP 05/19/23 1010 (!) 151/72     Systolic BP Percentile --      Diastolic BP Percentile --      Pulse Rate 05/19/23 1010 (!) 58     Resp 05/19/23 1010 18     Temp 05/19/23 1010 97.7 F (36.5 C)     Temp Source 05/19/23 1010 Oral     SpO2 05/19/23 1010 94 %     Weight 05/19/23 1027 184 lb 4.9 oz (83.6 kg)     Height 05/19/23 1027 5' 11 (1.803 m)     Head Circumference --      Peak Flow --      Pain Score --      Pain Loc --      Pain Education --      Exclude from Growth Chart --     Most recent vital signs: Vitals:   05/19/23 1010  BP: (!) 151/72  Pulse: (!) 58  Resp: 18  Temp: 97.7 F (36.5 C)  SpO2: 94%     General: Awake, interactive  CV:  Regular rate,  good peripheral perfusion.  Resp:  Unlabored respirations.  Abd:  Fullness of abdomen without significant tenderness to palpation Neuro:  Symmetric facial movement, fluid speech   ED Results / Procedures / Treatments   Labs (all labs ordered are listed, but only abnormal results are displayed) Labs Reviewed  CBC WITH DIFFERENTIAL/PLATELET - Abnormal; Notable for the following components:      Result Value   RBC 3.70 (*)    Hemoglobin 12.2 (*)    HCT 35.1 (*)    All other components within normal limits  COMPREHENSIVE METABOLIC PANEL - Abnormal; Notable for the following components:   Sodium 132 (*)    Glucose, Bld 108 (*)    BUN 32 (*)    Calcium 7.9 (*)    Total Protein 4.9 (*)    Albumin 2.8 (*)    All other components within normal limits     EKG EKG independently reviewed interpreted by myself (ER attending) demonstrates:    RADIOLOGY Imaging independently reviewed and interpreted by myself demonstrates:  CT abdomen pelvis demonstrates high-grade bowel obstruction with transition point in the  right lower abdomen, per radiology like due to adhesion  PROCEDURES:  Critical Care performed: No  Procedures   MEDICATIONS ORDERED IN ED: Medications  diatrizoate  meglumine -sodium (GASTROGRAFIN ) 66-10 % solution 90 mL (has no administration in time range)  morphine  (PF) 2 MG/ML injection 2 mg (has no administration in time range)     IMPRESSION / MDM / ASSESSMENT AND PLAN / ED COURSE  I reviewed the triage vital signs and the nursing notes.  Differential diagnosis includes, but is not limited to, bowel obstruction, ileus, colitis  Patient's presentation is most consistent with acute presentation with potential threat to life or bodily function.  73 year old male presenting with small bowel obstruction identified on outpatient CT.  Patient initially seen at [facility], transferred here given recent surgery at our facility.  I did confirm with Dr. Jordis with general  surgery that he did recommend accepting this patient prior to transfer.  On arrival here, case was reviewed with PA Keren with general surgery team.  He noted that since Dr. Cesar previously performed the surgery, he did recommend discussion with him regarding management of the patient.  Dr. Cesar was aware plan for transfer, initially I thought that patient had been accepted as a direct admit.  While in the ER, he did recommend placement of an NG tube, but requested the patient be admitted to the medicine service for further management  and he will consult on the patient.  Will reach out to hospitalist team.  Case discussed with hospitalist team.  They will evaluate for anticipated admission.    FINAL CLINICAL IMPRESSION(S) / ED DIAGNOSES   Final diagnoses:  Small bowel obstruction (HCC)     Rx / DC Orders   ED Discharge Orders     None        Note:  This document was prepared using Dragon voice recognition software and may include unintentional dictation errors.   Levander Slate, MD 05/19/23 [phone]

## 2023-05-19 NOTE — ED Notes (Signed)
NG tube clamped.

## 2023-05-19 NOTE — ED Triage Notes (Signed)
 Patient presented to [facility] with abdominal pain. Patient had hernia surgery here on 12/13 and had a small bowel obstruction at that time that was fixed. Patient was noted to have another SBO today through CT at [facility]. Patient was given Morphine  and D5 + Half normal saline there. No thoughts of harming himself or others. NKDA. Will take blood products. Hasn't been able to hold anything down for about a week.

## 2023-05-19 NOTE — ED Notes (Signed)
NG tube on slow intermittent suction.

## 2023-05-19 NOTE — H&P (Signed)
 History and Physical    Patient: Donald Lee FMW:[id number] DOB: 10-09-1950 DOA: 05/19/2023 DOS: the patient was seen and examined on 05/19/2023 PCP: Steva Clotilda DEL, NP  Patient coming from: Home  Chief Complaint: Abdominal pain  HPI: Donald Lee is a 74 y.o. male with medical history significant of hypertension, hyperlipidemia, hypothyroidism, OSA on CPAP who was otherwise well until 2 days ago when he started experiencing nausea and vomiting.  According to patient 2 days ago he vomited 5 times and says that now he has not been able to eat any meal.  He followed up with his physician yesterday and CT scan of the abdomen was done that showed small bowel obstruction and patient was called to come to the emergency room for further management.  Patient has absolute constipation with associated abdominal distention.  Denies fever chest pain or urinary complaints Upon arrival to the emergency room, vitals 97.7 respiratory rate 18 pulse 58 blood pressure 151/72.  ED physician discussed with surgery who recommended [facility] admission and they will see in consultation.  Review of Systems: As mentioned in the history of present illness. All other systems reviewed and are negative. Past Medical History:  Diagnosis Date   Benign localized hyperplasia of prostate with urinary obstruction    Depressive disorder    Dyslipidemia    Hyperlipidemia    Hypertension    Hypogammaglobulinemia (HCC)    Hypothyroidism    Hypothyroidism    OSA on CPAP    Primary osteoarthritis of left foot    Spinal stenosis of lumbar region with neurogenic claudication    Past Surgical History:  Procedure Laterality Date   APPENDECTOMY     BACK SURGERY     BIOPSY  05/12/2023   Procedure: BIOPSY;  Surgeon: Aundria, Ladell POUR, MD;  Location: [facility] ENDOSCOPY;  Service: Gastroenterology;;   COLONOSCOPY WITH PROPOFOL  N/A 05/12/2023   Procedure: COLONOSCOPY WITH PROPOFOL ;  Surgeon: Toledo, Ladell POUR, MD;  Location: [facility]  ENDOSCOPY;  Service: Gastroenterology;  Laterality: N/A;   ESOPHAGOGASTRODUODENOSCOPY (EGD) WITH PROPOFOL  N/A 05/12/2023   Procedure: ESOPHAGOGASTRODUODENOSCOPY (EGD) WITH PROPOFOL ;  Surgeon: Toledo, Ladell POUR, MD;  Location: [facility] ENDOSCOPY;  Service: Gastroenterology;  Laterality: N/A;   FOOT SURGERY     HERNIA REPAIR Right    SHOULDER ARTHROSCOPY WITH SUBACROMIAL DECOMPRESSION AND OPEN ROTATOR C Left 08/13/2021   Procedure: Left shoulder arthroscopic biceps tenodesis with subacromial decompression and mini open rotator cuff repair;  Surgeon: Tobie Priest, MD;  Location: [facility];  Service: Orthopedics;  Laterality: Left;   THYROIDECTOMY, PARTIAL     VASECTOMY     Social History:  reports that he has been smoking cigars. He has never used smokeless tobacco. He reports that he does not currently use alcohol. He reports that he does not use drugs.  No Known Allergies  Family History  Problem Relation Age of Onset   Pancreatic cancer Mother    Liver cancer Mother     Prior to Admission medications   Medication Sig Start Date End Date Taking? Authorizing Provider  albuterol (VENTOLIN HFA) 108 (90 Base) MCG/ACT inhaler Inhale 2 puffs into the lungs every 6 (six) hours as needed for wheezing or shortness of breath.    [provider]  alfuzosin (UROXATRAL) 10 MG 24 hr tablet Take 10 mg by mouth daily.    [provider]  atorvastatin (LIPITOR) 20 MG tablet Take 20 mg by mouth daily. Patient not taking: Reported on 05/12/2023 04/19/18   [provider]  cetirizine (ZYRTEC) 10 MG tablet Take 10 mg by mouth daily.    [provider]  Cholecalciferol (VITAMIN D3) 50 MCG (2000 UT) capsule Take 2,000 Units by mouth daily.    [provider]  diclofenac (VOLTAREN) 75 MG EC tablet Take 75 mg by mouth 2 (two) times daily. Patient not taking: Reported on 05/12/2023    [provider]  finasteride (PROSCAR) 5 MG tablet Take 1 tablet by mouth  daily. 02/13/23 02/13/24  [provider]  levothyroxine  (SYNTHROID , LEVOTHROID) 125 MCG tablet 112 mcg. Two tablets twice daily  05/18/18   [provider]  Omega-3 Fatty Acids (FISH OIL) 1000 MG CAPS Take 2,000 mg by mouth daily.    [provider]  oxyCODONE -acetaminophen  (PERCOCET) 5-325 MG tablet Take 1 tablet by mouth every 4 (four) hours as needed for severe pain (pain score 7-10). 03/27/23 03/26/24  Rodolph Romano, MD  sertraline (ZOLOFT) 100 MG tablet Take 100 mg by mouth daily.    [provider]  sildenafil (VIAGRA) 100 MG tablet Take 100 mg by mouth as needed for erectile dysfunction. 03/10/22   [provider]  solifenacin (VESICARE) 10 MG tablet Take 10 mg by mouth daily. 02/13/23 02/13/24  [provider]  ZEPBOUND 15 MG/0.5ML Pen Inject 15 mg into the skin once a week. Takes on Tuesday    [provider]    Physical Exam: Vitals:   05/19/23 1010 05/19/23 1027 05/19/23 1558  BP: (!) 151/72  139/86  Pulse: (!) 58  64  Resp: 18  18  Temp: 97.7 F (36.5 C)  98.7 F (37.1 C)  TempSrc: Oral  Oral  SpO2: 94%  96%  Weight:  83.6 kg   Height:  5' 11 (1.803 m)    General: Elderly male laying in bed with NG tube in place Abdomen: Abdomen distended nontender no guarding CVS: S1 and S2 present no Mamma Respiratory: Clear to auscultation bilaterally CNS: Alert and oriented x 3 moving all extremities Psychiatric: Normal mood  Data Reviewed: CT scan of the abdomen reviewed showing findings of high-grade small bowel obstruction    Latest Ref Rng & Units 05/19/2023   11:24 AM 04/08/2023   12:56 PM 03/25/2023    1:59 PM  CBC  WBC 4.0 - 10.5 K/uL 6.1  11.0  11.0   Hemoglobin 13.0 - 17.0 g/dL 87.7  88.4  88.4   Hematocrit 39.0 - 52.0 % 35.1  34.1  32.5   Platelets 150 - 400 K/uL 281  365  332        Latest Ref Rng & Units 05/19/2023   11:24 AM 04/08/2023   12:56 PM 03/25/2023    1:59 PM  CMP  Glucose 70 - 99  mg/dL 891  897  896   BUN 8 - 23 mg/dL 32  31  33   Creatinine 0.61 - 1.24 mg/dL 8.97  8.78  8.41   Sodium 135 - 145 mmol/L 132  135  136   Potassium 3.5 - 5.1 mmol/L 3.9  4.3  4.2   Chloride 98 - 111 mmol/L 98  102  102   CO2 22 - 32 mmol/L 25  24  24    Calcium 8.9 - 10.3 mg/dL 7.9  8.2  8.7   Total Protein 6.5 - 8.1 g/dL 4.9  5.8    Total Bilirubin 0.0 - 1.2 mg/dL 0.6  0.6    Alkaline Phos 38 - 126 U/L 115  56    AST 15 -  41 U/L 21  17    ALT 0 - 44 U/L 19  22      Assessment and Plan:  Acute abdomen secondary to small bowel obstruction CT scan of the abdomen showing findings of high-grade small bowel obstruction Continue NG tube Continue maintenance IV fluid Continue as needed pain medication Continue symptomatic management with Zofran  Plan of care discussed with surgeon and ED physician Maintain n.p.o.  Essential hypertension Keep on as needed IV hydralazine for now Chronic gastric ulcer  Mild hyponatremia secondary to dehydration Continue current IV fluid  Hyperlipidemia Resume home dose of Synthroid  when no longer n.p.o.  Hyperlipidemia Holding statin therapy on account of n.p.o.   OSA on CPAP Continue CPAP at night  DVT prophylaxis-continue SCD   Advance Care Planning:   Code Status: Full Code full code  Consults: General Surgery  Family Communication: No family at bedside  Severity of Illness: The appropriate patient status for this patient is INPATIENT. Inpatient status is judged to be reasonable and necessary in order to provide the required intensity of service to ensure the patient's safety. The patient's presenting symptoms, physical exam findings, and initial radiographic and laboratory data in the context of their chronic comorbidities is felt to place them at high risk for further clinical deterioration. Furthermore, it is not anticipated that the patient will be medically stable for discharge from the hospital within 2 midnights of admission.   *  I certify that at the point of admission it is my clinical judgment that the patient will require inpatient hospital care spanning beyond 2 midnights from the point of admission due to high intensity of service, high risk for further deterioration and high frequency of surveillance required.*  Author: Drue ONEIDA Potter, MD 05/19/2023 4:57 PM  For on call review [email or web address].

## 2023-05-19 NOTE — Consult Note (Signed)
 SURGICAL CONSULTATION NOTE   HISTORY OF PRESENT ILLNESS (HPI):  73 y.o. male presented to [facility] ED for evaluation of bowel obstruction. Patient reports he has been feeling weak with significant nausea and vomiting for the last 5 days.  He endorses that the vomiting looked like stool.  He endorses generalized abdominal pain.  No pain radiation.  He cannot identify any specific alleviating or aggravating factors.  He contacted his GI provider since he had a recent colonoscopy a week ago and CT scan of the abdomen and pelvis was done showing high-grade bowel obstruction.  I personally evaluated the images.  No pneumatosis, no free fluid or free air.  Patient was seen at the [facility] ED and surgery refused to see the patient because he understand that this is a complication from a surgery 2 months ago.  Patient had history of robotic assisted laparoscopic right inguinal hernia repair 2 months ago.  He endorses that he has been recovering great from the surgery he even tolerated the prep without any problems.  He had a colonoscopy due to chronic diarrhea for many years.  He also had an upper endoscopy.  Upper endoscopy positive for moderate inflammation characterized by congestion, erosion and erythema found at the gastric antrum.  Chronic gastritis.  Nonbleeding gastric ulcer.  Duodenal erosion without bleeding.  Colonoscopy showed mild diverticulosis but no other significant finding.  As per recommendation from surgery at [facility] he was transferred to the restitution for follow-up with me.  His labs shows normal white blood cell count of 6.1.  Hemoglobin of 12.2.  There is no significant electrolyte disturbance only mild hyponatremia of 132.  Surgery is consulted by Dr. Nilsa in this context for evaluation and management of bowel obstruction.  PAST MEDICAL HISTORY (PMH):  Past Medical History:  Diagnosis Date   Benign localized hyperplasia of prostate with urinary obstruction    Depressive disorder     Dyslipidemia    Hyperlipidemia    Hypertension    Hypogammaglobulinemia (HCC)    Hypothyroidism    Hypothyroidism    OSA on CPAP    Primary osteoarthritis of left foot    Spinal stenosis of lumbar region with neurogenic claudication      PAST SURGICAL HISTORY (PSH):  Past Surgical History:  Procedure Laterality Date   APPENDECTOMY     BACK SURGERY     BIOPSY  05/12/2023   Procedure: BIOPSY;  Surgeon: Aundria, Ladell POUR, MD;  Location: [facility] ENDOSCOPY;  Service: Gastroenterology;;   COLONOSCOPY WITH PROPOFOL  N/A 05/12/2023   Procedure: COLONOSCOPY WITH PROPOFOL ;  Surgeon: Toledo, Ladell POUR, MD;  Location: [facility] ENDOSCOPY;  Service: Gastroenterology;  Laterality: N/A;   ESOPHAGOGASTRODUODENOSCOPY (EGD) WITH PROPOFOL  N/A 05/12/2023   Procedure: ESOPHAGOGASTRODUODENOSCOPY (EGD) WITH PROPOFOL ;  Surgeon: Toledo, Ladell POUR, MD;  Location: [facility] ENDOSCOPY;  Service: Gastroenterology;  Laterality: N/A;   FOOT SURGERY     HERNIA REPAIR Right    SHOULDER ARTHROSCOPY WITH SUBACROMIAL DECOMPRESSION AND OPEN ROTATOR C Left 08/13/2021   Procedure: Left shoulder arthroscopic biceps tenodesis with subacromial decompression and mini open rotator cuff repair;  Surgeon: Tobie Priest, MD;  Location: [facility];  Service: Orthopedics;  Laterality: Left;   THYROIDECTOMY, PARTIAL     VASECTOMY       MEDICATIONS:  Prior to Admission medications   Medication Sig Start Date End Date Taking? Authorizing Provider  albuterol (VENTOLIN HFA) 108 (90 Base) MCG/ACT inhaler Inhale 2 puffs into the lungs every 6 (six) hours as needed for wheezing or  shortness of breath.    [provider]  alfuzosin (UROXATRAL) 10 MG 24 hr tablet Take 10 mg by mouth daily.    [provider]  atorvastatin (LIPITOR) 20 MG tablet Take 20 mg by mouth daily. Patient not taking: Reported on 05/12/2023 04/19/18   [provider]  cetirizine (ZYRTEC) 10 MG tablet Take 10 mg by mouth daily.    [provider]  Cholecalciferol (VITAMIN D3) 50 MCG (2000 UT) capsule Take 2,000 Units by mouth daily.    [provider]  diclofenac (VOLTAREN) 75 MG EC tablet Take 75 mg by mouth 2 (two) times daily. Patient not taking: Reported on 05/12/2023    [provider]  finasteride (PROSCAR) 5 MG tablet Take 1 tablet by mouth daily. 02/13/23 02/13/24  [provider]  levothyroxine  (SYNTHROID , LEVOTHROID) 125 MCG tablet 112 mcg. Two tablets twice daily  05/18/18   [provider]  Omega-3 Fatty Acids (FISH OIL) 1000 MG CAPS Take 2,000 mg by mouth daily.    [provider]  oxyCODONE -acetaminophen  (PERCOCET) 5-325 MG tablet Take 1 tablet by mouth every 4 (four) hours as needed for severe pain (pain score 7-10). 03/27/23 03/26/24  Rodolph Romano, MD  sertraline (ZOLOFT) 100 MG tablet Take 100 mg by mouth daily.    [provider]  sildenafil (VIAGRA) 100 MG tablet Take 100 mg by mouth as needed for erectile dysfunction. 03/10/22   [provider]  solifenacin (VESICARE) 10 MG tablet Take 10 mg by mouth daily. 02/13/23 02/13/24  [provider]  ZEPBOUND 15 MG/0.5ML Pen Inject 15 mg into the skin once a week. Takes on Tuesday    [provider]     ALLERGIES:  No Known Allergies   SOCIAL HISTORY:  Social History   Socioeconomic History   Marital status: Married    Spouse name: Not on file   Number of children: Not on file   Years of education: Not on file   Highest education level: Not on file  Occupational History   Not on file  Tobacco Use   Smoking status: Light Smoker    Types: Cigars   Smokeless tobacco: Never   Tobacco comments:    2 cigars a day  Vaping Use   Vaping status: Never Used  Substance and Sexual Activity   Alcohol use: Not Currently    Comment: rare   Drug use: Never   Sexual activity: Not on file  Other Topics Concern   Not on file  Social History Narrative   Not on file   Social  Drivers of Health   Financial Resource Strain: Low Risk  (04/28/2023)   Received from [facility]   Overall Financial Resource Strain (CARDIA)    Difficulty of Paying Living Expenses: Not hard at all  Food Insecurity: No Food Insecurity (04/28/2023)   Received from [facility]   Hunger Vital Sign    Worried About Running Out of Food in the Last Year: Never true    Ran Out of Food in the Last Year: Never true  Transportation Needs: No Transportation Needs (04/28/2023)   Received from [facility] - Transportation    In the past 12 months, has lack of transportation kept you from medical appointments or from getting medications?: No    Lack of Transportation (Non-Medical): No  Physical Activity: Insufficiently Active (06/08/2017)   Received from [facility], [facility]  Exercise Vital Sign    Days of Exercise per Week: 3 days    Minutes of Exercise per Session: 30 min  Stress: No Stress Concern Present (06/08/2017)   Received from [facility], [facility]   [employer] of Occupational [facility] - Occupational Stress Questionnaire    Feeling of Stress : Not at all  Social Connections: Moderately Integrated (06/08/2017)   Received from [facility], [facility]   Social Connection and Isolation Panel [NHANES]    Frequency of Communication with Friends and Family: More than three times a week    Frequency of Social Gatherings with Friends and Family: More than three times a week    Attends Religious Services: More than 4 times per year    Active Member of [employer] or Organizations: Yes    Attends Banker Meetings: More than 4 times per year    Marital Status: Widowed  Catering Manager Violence: Not on file      FAMILY HISTORY:  Family History  Problem Relation Age of Onset   Pancreatic cancer Mother     Liver cancer Mother      REVIEW OF SYSTEMS:  Constitutional: denies weight loss, fever, chills, or sweats  Eyes: denies any other vision changes, history of eye injury  ENT: denies sore throat, hearing problems  Respiratory: denies shortness of breath, wheezing  Cardiovascular: denies chest pain, palpitations  Gastrointestinal: Positive abdominal pain, nausea and vomiting Genitourinary: denies burning with urination or urinary frequency Musculoskeletal: denies any other joint pains or cramps  Skin: denies any other rashes or skin discolorations  Neurological: denies any other headache, dizziness, weakness  Psychiatric: denies any other depression, anxiety   All other review of systems were negative   VITAL SIGNS:  Temp:  [97.7 F (36.5 C)] 97.7 F (36.5 C) (02/04 1010) Pulse Rate:  [58] 58 (02/04 1010) Resp:  [18] 18 (02/04 1010) BP: (151)/(72) 151/72 (02/04 1010) SpO2:  [94 %] 94 % (02/04 1010) Weight:  [83.6 kg] 83.6 kg (02/04 1027)     Height: 5' 11 (180.3 cm) Weight: 83.6 kg BMI (Calculated): 25.72   INTAKE/OUTPUT:  This shift: No intake/output data recorded.  Last 2 shifts: @IOLAST2SHIFTS @   PHYSICAL EXAM:  Constitutional:  -- Normal body habitus  -- Awake, alert, and oriented x3  Eyes:  -- Pupils equally round and reactive to light  -- No scleral icterus  Ear, nose, and throat:  -- No jugular venous distension  Pulmonary:  -- No crackles  -- Equal breath sounds bilaterally -- Breathing non-labored at rest Cardiovascular:  -- S1, S2 present  -- No pericardial rubs Gastrointestinal:  -- Abdomen soft, mild tender, distended, no guarding or rebound tenderness -- No abdominal masses appreciated, pulsatile or otherwise  Musculoskeletal and Integumentary:  -- Wounds: None appreciated -- Extremities: B/L UE and LE FROM, hands and feet warm, no edema  Neurologic:  -- Motor function: intact and symmetric -- Sensation: intact and symmetric   Labs:     Latest  Ref Rng & Units 05/19/2023   11:24 AM 04/08/2023   12:56 PM 03/25/2023    1:59 PM  CBC  WBC 4.0 - 10.5 K/uL 6.1  11.0  11.0   Hemoglobin 13.0 - 17.0 g/dL 87.7  88.4  88.4   Hematocrit 39.0 - 52.0 % 35.1  34.1  32.5   Platelets 150 - 400 K/uL 281  365  332       Latest  Ref Rng & Units 05/19/2023   11:24 AM 04/08/2023   12:56 PM 03/25/2023    1:59 PM  CMP  Glucose 70 - 99 mg/dL 891  897  896   BUN 8 - 23 mg/dL 32  31  33   Creatinine 0.61 - 1.24 mg/dL 8.97  8.78  8.41   Sodium 135 - 145 mmol/L 132  135  136   Potassium 3.5 - 5.1 mmol/L 3.9  4.3  4.2   Chloride 98 - 111 mmol/L 98  102  102   CO2 22 - 32 mmol/L 25  24  24    Calcium 8.9 - 10.3 mg/dL 7.9  8.2  8.7   Total Protein 6.5 - 8.1 g/dL 4.9  5.8    Total Bilirubin 0.0 - 1.2 mg/dL 0.6  0.6    Alkaline Phos 38 - 126 U/L 115  56    AST 15 - 41 U/L 21  17    ALT 0 - 44 U/L 19  22      Imaging studies:  EXAM: CT ABDOMEN AND PELVIS WITH CONTRAST   TECHNIQUE: Multidetector CT imaging of the abdomen and pelvis was performed using the standard protocol following bolus administration of intravenous contrast.   RADIATION DOSE REDUCTION: This exam was performed according to the departmental dose-optimization program which includes automated exposure control, adjustment of the mA and/or kV according to patient size and/or use of iterative reconstruction technique.   CONTRAST:  OMNIPAQUE  IOHEXOL  300 MG/ML  SOLN   COMPARISON:  04/08/2023   FINDINGS: Lower Chest: No acute findings.   Hepatobiliary: Mild diffuse hepatic steatosis, increased since previous study. Stable benign-appearing hepatic cysts. No suspicious hepatic masses identified. Gallbladder is unremarkable. No evidence of biliary ductal dilatation.   Pancreas:  No mass or inflammatory changes.   Spleen: Within normal limits in size and appearance.   Adrenals/Urinary Tract: No suspicious masses identified. No evidence of ureteral calculi or  hydronephrosis.   Stomach/Bowel: Markedly dilated small bowel is seen, with transition point in the anterior right abdomen just deep to the anterior abdominal wall muscles, most likely due to adhesion. No mass or inflammatory process identified. Diverticulosis is seen mainly involving the descending and sigmoid colon, however there is no evidence of diverticulitis.   Vascular/Lymphatic: No pathologically enlarged lymph nodes. No acute vascular findings.   Reproductive:  No mass or other significant abnormality.   Other: Postop changes again noted from previous left inguinal hernia repair.   Musculoskeletal:  No suspicious bone lesions identified.   IMPRESSION: High-grade distal small bowel obstruction, with transition point in anterior right abdomen just deep to the anterior abdominal wall muscles, most likely due to adhesion.   Colonic diverticulosis, without radiographic evidence of diverticulitis.   Mild hepatic steatosis, increased since previous study.     Electronically Signed   By: Norleen DELENA Kil M.D.   On: 05/18/2023 18:13  Assessment/Plan:  73 y.o. male with small bowel obstruction, complicated by pertinent comorbidities including: Hypertension, chronic gastritis, chronic diarrhea.  Small bowel obstruction -CT scan consistent with high-grade bowel obstruction with transition point in the right lower quadrant.  No hernia appreciated on CT scan. -Physical exam with mild distention without tenderness to palpation -Patient also had previous surgical intervention such as appendectomy and left inguinal hernia repair laparoscopically -Bowel obstruction most likely consistent adhesion from previous surgeries.  Patient was recovering well from robotic assisted laparoscopic right benign repair including able to tolerate bowel prep, colonoscopy and upper endoscopy -Will proceed  with NGT placement for gastric and small intestinal decompression, Gastrografin  challenge and if no  improvement will consider surgical intervention for lysis of adhesions -Patient arrived around 1030 transferred from [facility].  No NGT was placed at [facility].  Unfortunately due to being in the hall, NGT has not been placed yet.  Now the patient is in room hopefully he will get his NGT placed for decompression. -Patient oriented about this recommendation and agreed with plan.  Lucas Petrin, MD

## 2023-05-20 ENCOUNTER — Other Ambulatory Visit: Payer: Self-pay

## 2023-05-20 ENCOUNTER — Inpatient Hospital Stay: Payer: Medicare Other

## 2023-05-20 ENCOUNTER — Inpatient Hospital Stay: Payer: Medicare Other | Admitting: Certified Registered Nurse Anesthetist

## 2023-05-20 ENCOUNTER — Encounter
Admission: EM | Disposition: A | Discharge status: Home / Self Care | Payer: Self-pay | Source: Home / Self Care | Attending: Internal Medicine

## 2023-05-20 DIAGNOSIS — K56609 Unspecified intestinal obstruction, unspecified as to partial versus complete obstruction: Secondary | ICD-10-CM | POA: Diagnosis not present

## 2023-05-20 HISTORY — PX: XI ROBOT ASSISTED DIAGNOSTIC LAPAROSCOPY: SHX6815

## 2023-05-20 HISTORY — PX: BOWEL RESECTION: SHX1257

## 2023-05-20 LAB — CBC
HCT: 33.3 % — ABNORMAL LOW (ref 39.0–52.0)
Hemoglobin: 11.5 g/dL — ABNORMAL LOW (ref 13.0–17.0)
MCH: 32.4 pg (ref 26.0–34.0)
MCHC: 34.5 g/dL (ref 30.0–36.0)
MCV: 93.8 fL (ref 80.0–100.0)
Platelets: 291 10*3/uL (ref 150–400)
RBC: 3.55 MIL/uL — ABNORMAL LOW (ref 4.22–5.81)
RDW: 13.2 % (ref 11.5–15.5)
WBC: 5.6 10*3/uL (ref 4.0–10.5)
nRBC: 0 % (ref 0.0–0.2)

## 2023-05-20 LAB — BASIC METABOLIC PANEL
Anion gap: 7 (ref 5–15)
BUN: 26 mg/dL — ABNORMAL HIGH (ref 8–23)
CO2: 23 mmol/L (ref 22–32)
Calcium: 7.9 mg/dL — ABNORMAL LOW (ref 8.9–10.3)
Chloride: 103 mmol/L (ref 98–111)
Creatinine, Ser: 0.82 mg/dL (ref 0.61–1.24)
GFR, Estimated: >60 mL/min (ref >60)
Glucose, Bld: 88 mg/dL (ref 70–99)
Potassium: 3.7 mmol/L (ref 3.5–5.1)
Sodium: 133 mmol/L — ABNORMAL LOW (ref 135–145)

## 2023-05-20 SURGERY — LAPAROSCOPY, DIAGNOSTIC, ROBOT-ASSISTED
Anesthesia: General | Site: Abdomen

## 2023-05-20 MED ORDER — LACTATED RINGERS IV SOLN: INTRAVENOUS | Status: DC | PRN

## 2023-05-20 MED ORDER — ALUM & MAG HYDROXIDE-SIMETH 200-200-20 MG/5ML PO SUSP
30.0000 mL | ORAL | Status: DC | PRN
  Administered 2023-05-20: 30 mL via ORAL
  Filled 2023-05-20: qty 30

## 2023-05-20 MED ORDER — HYDROCODONE-ACETAMINOPHEN 5-325 MG PO TABS
1.0000 | ORAL_TABLET | ORAL | Status: DC | PRN
  Administered 2023-05-23 (×3): 1
  Filled 2023-05-20 (×3): qty 1

## 2023-05-20 MED ORDER — FENTANYL CITRATE (PF) 100 MCG/2ML IJ SOLN
INTRAMUSCULAR | Status: DC | PRN
  Administered 2023-05-20 (×3): 50 ug via INTRAVENOUS

## 2023-05-20 MED ORDER — PHENYLEPHRINE 80 MCG/ML (10ML) SYRINGE FOR IV PUSH (FOR BLOOD PRESSURE SUPPORT)
PREFILLED_SYRINGE | INTRAVENOUS | Status: AC
  Filled 2023-05-20: qty 10

## 2023-05-20 MED ORDER — ACETAMINOPHEN 10 MG/ML IV SOLN
INTRAVENOUS | Status: DC | PRN
  Administered 2023-05-20: 1000 mg via INTRAVENOUS

## 2023-05-20 MED ORDER — ENOXAPARIN SODIUM 40 MG/0.4ML IJ SOSY
40.0000 mg | PREFILLED_SYRINGE | INTRAMUSCULAR | Status: DC
Start: 2023-05-20 — End: 2023-05-25
  Administered 2023-05-20 – 2023-05-25 (×6): 40 mg via SUBCUTANEOUS
  Filled 2023-05-20 (×6): qty 0.4

## 2023-05-20 MED ORDER — DEXAMETHASONE SODIUM PHOSPHATE 10 MG/ML IJ SOLN
INTRAMUSCULAR | Status: AC
  Filled 2023-05-20: qty 1

## 2023-05-20 MED ORDER — ROCURONIUM BROMIDE 10 MG/ML (PF) SYRINGE
PREFILLED_SYRINGE | INTRAVENOUS | Status: AC
  Filled 2023-05-20: qty 10

## 2023-05-20 MED ORDER — CEFAZOLIN SODIUM 1 G IJ SOLR
INTRAMUSCULAR | Status: AC
  Filled 2023-05-20: qty 20

## 2023-05-20 MED ORDER — FENTANYL CITRATE (PF) 100 MCG/2ML IJ SOLN
INTRAMUSCULAR | Status: AC
  Filled 2023-05-20: qty 2

## 2023-05-20 MED ORDER — LIDOCAINE HCL (CARDIAC) PF 100 MG/5ML IV SOSY
PREFILLED_SYRINGE | INTRAVENOUS | Status: DC | PRN
  Administered 2023-05-20: 80 mg via INTRAVENOUS

## 2023-05-20 MED ORDER — EPINEPHRINE PF 1 MG/ML IJ SOLN
INTRAMUSCULAR | Status: AC
  Filled 2023-05-20: qty 1

## 2023-05-20 MED ORDER — LIDOCAINE HCL (PF) 2 % IJ SOLN
INTRAMUSCULAR | Status: AC
  Filled 2023-05-20: qty 5

## 2023-05-20 MED ORDER — ACETAMINOPHEN 10 MG/ML IV SOLN
INTRAVENOUS | Status: AC
  Filled 2023-05-20: qty 100

## 2023-05-20 MED ORDER — 0.9 % SODIUM CHLORIDE (POUR BTL) OPTIME
TOPICAL | Status: DC | PRN
  Administered 2023-05-20: 500 mL

## 2023-05-20 MED ORDER — BUPIVACAINE HCL (PF) 0.5 % IJ SOLN
INTRAMUSCULAR | Status: AC
  Filled 2023-05-20: qty 30

## 2023-05-20 MED ORDER — HYDROMORPHONE HCL 1 MG/ML IJ SOLN: 0.2500 mg | INTRAMUSCULAR | Status: DC | PRN

## 2023-05-20 MED ORDER — ROCURONIUM BROMIDE 100 MG/10ML IV SOLN
INTRAVENOUS | Status: DC | PRN
  Administered 2023-05-20: 30 mg via INTRAVENOUS
  Administered 2023-05-20: 5 mg via INTRAVENOUS

## 2023-05-20 MED ORDER — SUGAMMADEX SODIUM 200 MG/2ML IV SOLN
INTRAVENOUS | Status: DC | PRN
  Administered 2023-05-20: 200 mg via INTRAVENOUS

## 2023-05-20 MED ORDER — PROPOFOL 10 MG/ML IV BOLUS
INTRAVENOUS | Status: AC
  Filled 2023-05-20: qty 20

## 2023-05-20 MED ORDER — KETOROLAC TROMETHAMINE 30 MG/ML IJ SOLN
INTRAMUSCULAR | Status: DC | PRN
  Administered 2023-05-20: 30 mg via INTRAVENOUS

## 2023-05-20 MED ORDER — DEXAMETHASONE SODIUM PHOSPHATE 10 MG/ML IJ SOLN
INTRAMUSCULAR | Status: DC | PRN
  Administered 2023-05-20: 10 mg via INTRAVENOUS

## 2023-05-20 MED ORDER — OXYCODONE HCL 5 MG/5ML PO SOLN: 5.0000 mg | Freq: Once | ORAL | Status: DC | PRN

## 2023-05-20 MED ORDER — ONDANSETRON HCL 4 MG/2ML IJ SOLN
INTRAMUSCULAR | Status: AC
  Filled 2023-05-20: qty 2

## 2023-05-20 MED ORDER — SUCCINYLCHOLINE CHLORIDE 200 MG/10ML IV SOSY
PREFILLED_SYRINGE | INTRAVENOUS | Status: DC | PRN
  Administered 2023-05-20: 100 mg via INTRAVENOUS

## 2023-05-20 MED ORDER — KETOROLAC TROMETHAMINE 30 MG/ML IJ SOLN
INTRAMUSCULAR | Status: AC
  Filled 2023-05-20: qty 1

## 2023-05-20 MED ORDER — PANTOPRAZOLE SODIUM 40 MG IV SOLR
40.0000 mg | INTRAVENOUS | Status: DC
  Administered 2023-05-20 – 2023-05-25 (×6): 40 mg via INTRAVENOUS
  Filled 2023-05-20 (×6): qty 10

## 2023-05-20 MED ORDER — MORPHINE SULFATE (PF) 4 MG/ML IV SOLN
4.0000 mg | INTRAVENOUS | Status: DC | PRN
  Administered 2023-05-20: 4 mg via INTRAVENOUS
  Filled 2023-05-20: qty 1

## 2023-05-20 MED ORDER — SODIUM CHLORIDE 0.9 % IV SOLN: INTRAVENOUS | Status: AC | PRN

## 2023-05-20 MED ORDER — BUPIVACAINE LIPOSOME 1.3 % IJ SUSP
INTRAMUSCULAR | Status: AC
  Filled 2023-05-20: qty 20

## 2023-05-20 MED ORDER — MORPHINE SULFATE (PF) 2 MG/ML IV SOLN
2.0000 mg | INTRAVENOUS | Status: DC | PRN
  Administered 2023-05-20 – 2023-05-22 (×10): 2 mg via INTRAVENOUS
  Filled 2023-05-20 (×10): qty 1

## 2023-05-20 MED ORDER — PROPOFOL 10 MG/ML IV BOLUS
INTRAVENOUS | Status: DC | PRN
  Administered 2023-05-20: 50 mg via INTRAVENOUS

## 2023-05-20 MED ORDER — BUPIVACAINE-EPINEPHRINE (PF) 0.5% -1:200000 IJ SOLN
INTRAMUSCULAR | Status: DC | PRN
  Administered 2023-05-20: 50 mL via INTRAMUSCULAR

## 2023-05-20 MED ORDER — MORPHINE SULFATE (PF) 4 MG/ML IV SOLN: 4.0000 mg | INTRAVENOUS | Status: DC | PRN

## 2023-05-20 MED ORDER — CEFAZOLIN SODIUM-DEXTROSE 2-3 GM-%(50ML) IV SOLR
INTRAVENOUS | Status: DC | PRN
  Administered 2023-05-20: 2 g via INTRAVENOUS

## 2023-05-20 MED ORDER — ONDANSETRON HCL 4 MG/2ML IJ SOLN
4.0000 mg | Freq: Once | INTRAMUSCULAR | Status: AC
  Administered 2023-05-20: 4 mg via INTRAVENOUS

## 2023-05-20 MED ORDER — ORAL CARE MOUTH RINSE: 15.0000 mL | OROMUCOSAL | Status: DC | PRN

## 2023-05-20 MED ORDER — PHENYLEPHRINE 80 MCG/ML (10ML) SYRINGE FOR IV PUSH (FOR BLOOD PRESSURE SUPPORT)
PREFILLED_SYRINGE | INTRAVENOUS | Status: DC | PRN
  Administered 2023-05-20 (×2): 80 ug via INTRAVENOUS
  Administered 2023-05-20: 160 ug via INTRAVENOUS
  Administered 2023-05-20: 80 ug via INTRAVENOUS
  Administered 2023-05-20: 160 ug via INTRAVENOUS
  Administered 2023-05-20: 80 ug via INTRAVENOUS

## 2023-05-20 MED ORDER — OXYCODONE HCL 5 MG PO TABS: 5.0000 mg | ORAL_TABLET | Freq: Once | ORAL | Status: DC | PRN

## 2023-05-20 MED ORDER — SUCCINYLCHOLINE CHLORIDE 200 MG/10ML IV SOSY
PREFILLED_SYRINGE | INTRAVENOUS | Status: AC
  Filled 2023-05-20: qty 10

## 2023-05-20 SURGICAL SUPPLY — 44 items
CANNULA REDUCER 12-8 DVNC XI (CANNULA) ×2 IMPLANT
COVER TIP SHEARS 8 DVNC (MISCELLANEOUS) ×2 IMPLANT
DERMABOND ADVANCED .7 DNX12 (GAUZE/BANDAGES/DRESSINGS) ×2 IMPLANT
DRAPE ARM DVNC X/XI (DISPOSABLE) ×8 IMPLANT
DRAPE COLUMN DVNC XI (DISPOSABLE) ×2 IMPLANT
ELECT REM PT RETURN 9FT ADLT (ELECTROSURGICAL) ×2
ELECTRODE REM PT RTRN 9FT ADLT (ELECTROSURGICAL) ×2 IMPLANT
FORCEPS BPLR FENES DVNC XI (FORCEP) ×2 IMPLANT
GLOVE BIOGEL PI IND STRL 6.5 (GLOVE) ×4 IMPLANT
GLOVE SURG SYN 6.5 ES PF (GLOVE) ×6 IMPLANT
GLOVE SURG SYN 6.5 PF PI (GLOVE) ×2 IMPLANT
GOWN STRL REUS W/ TWL LRG LVL3 (GOWN DISPOSABLE) ×8 IMPLANT
GRASPER TIP-UP FEN DVNC XI (INSTRUMENTS) ×2 IMPLANT
HANDLE YANKAUER SUCT BULB TIP (MISCELLANEOUS) ×1 IMPLANT
KIT PINK PAD W/HEAD ARE REST (MISCELLANEOUS) ×2 IMPLANT
KIT PINK PAD W/HEAD ARM REST (MISCELLANEOUS) ×1 IMPLANT
LIGASURE IMPACT 36 18CM CVD LR (INSTRUMENTS) ×1 IMPLANT
MANIFOLD NEPTUNE II (INSTRUMENTS) ×2 IMPLANT
NDL DRIVE SUT CUT DVNC (INSTRUMENTS) ×1 IMPLANT
NDL HYPO 22X1.5 SAFETY MO (MISCELLANEOUS) ×1 IMPLANT
NDL INSUFFLATION 14GA 120MM (NEEDLE) ×1 IMPLANT
NEEDLE DRIVE SUT CUT DVNC (INSTRUMENTS) ×2 IMPLANT
NEEDLE HYPO 22X1.5 SAFETY MO (MISCELLANEOUS) ×2 IMPLANT
NEEDLE INSUFFLATION 14GA 120MM (NEEDLE) ×2 IMPLANT
OBTURATOR OPTICAL STND 8 DVNC (TROCAR) ×2
OBTURATOR OPTICALSTD 8 DVNC (TROCAR) ×2 IMPLANT
PACK LAP CHOLECYSTECTOMY (MISCELLANEOUS) ×2 IMPLANT
RELOAD PROXIMATE 75MM BLUE (ENDOMECHANICALS) ×6 IMPLANT
RELOAD STAPLE 75 3.8 BLU REG (ENDOMECHANICALS) IMPLANT
RETRACTOR WOUND ALXS 18CM SML (MISCELLANEOUS) ×1 IMPLANT
RTRCTR WOUND ALEXIS O 18CM SML (MISCELLANEOUS) ×2
SCISSORS MNPLR CVD DVNC XI (INSTRUMENTS) ×2 IMPLANT
SEAL UNIV 5-12 XI (MISCELLANEOUS) ×8 IMPLANT
SET TUBE SMOKE EVAC HIGH FLOW (TUBING) ×2 IMPLANT
SOL ELECTROSURG ANTI STICK (MISCELLANEOUS) ×2
SOLUTION ELECTROSURG ANTI STCK (MISCELLANEOUS) ×2 IMPLANT
SPONGE T-LAP 18X18 ~~LOC~~+RFID (SPONGE) ×1 IMPLANT
STAPLER PROXIMATE 75MM BLUE (STAPLE) ×1 IMPLANT
SUT MNCRL AB 4-0 PS2 18 (SUTURE) ×2 IMPLANT
SUT PDS PLUS AB 0 CT-2 (SUTURE) ×2 IMPLANT
SUT SILK 3 0 SH 30 (SUTURE) ×1 IMPLANT
SUT VIC AB 3-0 SH 27X BRD (SUTURE) ×2 IMPLANT
SUT VICRYL 0 UR6 27IN ABS (SUTURE) ×2 IMPLANT
TRAY FOLEY MTR SLVR 16FR STAT (SET/KITS/TRAYS/PACK) ×1 IMPLANT

## 2023-05-20 NOTE — Transfer of Care (Signed)
 Immediate Anesthesia Transfer of Care Note  Patient: Donald Lee  Procedure(s) Performed: XI ROBOT ASSISTED DIAGNOSTIC LAPAROSCOPY (Abdomen) SMALL BOWEL RESECTION (Abdomen)  Patient Location: PACU  Anesthesia Type:General  Level of Consciousness: drowsy  Airway & Oxygen Therapy: Patient Spontanous Breathing and Patient connected to face mask oxygen  Post-op Assessment: Report given to RN and Post -op Vital signs reviewed and stable  Post vital signs: Reviewed and stable  Last Vitals:  Vitals Value Taken Time  BP    Temp    Pulse    Resp    SpO2      Last Pain:  Vitals:   05/20/23 0954  TempSrc: Oral  PainSc: 0-No pain      Patients Stated Pain Goal: 0 (05/20/23 0234)  Complications: No notable events documented.

## 2023-05-20 NOTE — Progress Notes (Signed)
Transferred to the OR. 

## 2023-05-20 NOTE — TOC CM/SW Note (Signed)
 Transition of Care [facility]) - Inpatient Brief Assessment   Patient Details  Name: Donald Lee MRN: [id number] Date of Birth: [birth year]  [facility]) CM/SW Contact:    [name], LCSW Phone Number: 05/20/2023, 3:23 PM   Clinical Narrative: CSW reviewed chart. No TOC needs identified so far. CSW will continue to follow progress. Please place [facility] consult if any needs arise.  Transition of Care Asessment: Insurance and Status: Insurance coverage has been reviewed Patient has primary care physician: Yes Home environment has been reviewed: Single family home Prior level of function:: Not documented Prior/Current Home Services: No current home services Social Drivers of Health Review: SDOH reviewed no interventions necessary Readmission risk has been reviewed: Yes Transition of care needs: no transition of care needs at this time

## 2023-05-20 NOTE — Progress Notes (Addendum)
 PROGRESS NOTE    Donald Lee  FMW:[id number] DOB: [birth year] DOA: 05/19/2023 PCP: Steva Clotilda DEL, NP   Assessment & Plan:   Principal Problem:   Small bowel obstruction (HCC)  Assessment and Plan: SBO: CT scan of the abdomen showing findings of high-grade small bowel obstruction. Continue w/ NG tube. S/p small bowel resection 05/20/23 as per gen surg. Pathology is pending. Morphine  prn for pain. Gen surg following and recs apprec    HTN: not on any anti-HTN meds at home. IV hydralazine prn   Hyponatremia: trending up. Continue on IVFs    Hypothyroidism: start on IV levothyroxine  while NPO    HLD: holding home dose of statin while NPO    OSA: continue on CPAP qhs      DVT prophylaxis: lovenox   Code Status: full Family Communication: discussed pt's care w/ pt's wife at bedside and answered her questions  Disposition Plan: likely d/c back home   Level of care: Telemetry Medical  Status is: Inpatient Remains inpatient appropriate because: severity of illness, has NG tube still     Consultants:  Gen surg   Procedures:   Antimicrobials:    Subjective: Pt c/o abd pain   Objective: Vitals:   05/19/23 1957 05/20/23 0458 05/20/23 0753 05/20/23 0954  BP: (!) 153/79 (!) 153/79 (!) 153/74 (!) 160/98  Pulse: 63 60 (!) 49 61  Resp: 18 18 18 18   Temp: 97.9 F (36.6 C) 98 F (36.7 C) 98.2 F (36.8 C) 98.1 F (36.7 C)  TempSrc: Oral Oral Oral Oral  SpO2: 95% 94% 95% 94%  Weight:      Height:        Intake/Output Summary (Last 24 hours) at 05/20/2023 1218 Last data filed at 05/20/2023 1214 Gross per 24 hour  Intake 130 ml  Output 500 ml  Net -370 ml   Filed Weights   05/19/23 1027  Weight: 83.6 kg    Examination:  General exam: Appears calm but uncomfortable  Respiratory system: Clear to auscultation. Respiratory effort normal. Cardiovascular system: S1 & S2+. No rubs, gallops or clicks. Gastrointestinal system: Abdomen is nondistended, soft and  tenderness to palpation. Hypoactive bowel sounds heard. Central nervous system: Alert and oriented. Moves all extremities  Psychiatry: Judgement and insight appear normal. Mood & affect appropriate.     Data Reviewed: I have personally reviewed following labs and imaging studies  CBC: Recent Labs  Lab 05/19/23 1124 05/20/23 0556  WBC 6.1 5.6  NEUTROABS 4.4  --   HGB 12.2* 11.5*  HCT 35.1* 33.3*  MCV 94.9 93.8  PLT 281 291   Basic Metabolic Panel: Recent Labs  Lab 05/19/23 1124 05/20/23 0556  NA 132* 133*  K 3.9 3.7  CL 98 103  CO2 25 23  GLUCOSE 108* 88  BUN 32* 26*  CREATININE 1.02 0.82  CALCIUM 7.9* 7.9*   GFR: Estimated Creatinine Clearance: 85.5 mL/min (by C-G formula based on SCr of 0.82 mg/dL). Liver Function Tests: Recent Labs  Lab 05/19/23 1124  AST 21  ALT 19  ALKPHOS 115  BILITOT 0.6  PROT 4.9*  ALBUMIN 2.8*   No results for input(s): LIPASE, AMYLASE in the last 168 hours. No results for input(s): AMMONIA in the last 168 hours. Coagulation Profile: No results for input(s): INR, PROTIME in the last 168 hours. Cardiac Enzymes: No results for input(s): CKTOTAL, CKMB, CKMBINDEX, TROPONINI in the last 168 hours. BNP (last 3 results) No results for input(s): PROBNP in the last 8760 hours. HbA1C:  No results for input(s): HGBA1C in the last 72 hours. CBG: No results for input(s): GLUCAP in the last 168 hours. Lipid Profile: No results for input(s): CHOL, HDL, LDLCALC, TRIG, CHOLHDL, LDLDIRECT in the last 72 hours. Thyroid Function Tests: No results for input(s): TSH, T4TOTAL, FREET4, T3FREE, THYROIDAB in the last 72 hours. Anemia Panel: No results for input(s): VITAMINB12, FOLATE, FERRITIN, TIBC, IRON, RETICCTPCT in the last 72 hours. Sepsis Labs: No results for input(s): PROCALCITON, LATICACIDVEN in the last 168 hours.  No results found for this or any previous visit (from the past 240  hours).       Radiology Studies: DG Abd 2 Views Result Date: 05/20/2023 CLINICAL DATA:  Small bowel obstruction. Episode of diarrhea last night. Right-sided abdominal pain EXAM: ABDOMEN - 2 VIEW COMPARISON:  05/20/2023 at 2:40 a.m. FINDINGS: Nasogastric tube observed with tip in the stomach body and side port in the distal esophagus. The lung bases appear clear. Contrast medium is present in the colon and rectum. There are substantially dilated loops of small bowel measuring up to 8 cm in diameter, previously these measured up to 6 cm in diameter. Scattered air-fluid levels are present in some of the air fluid levels seem to likely be at differing vertical levels in the same loop, favoring small bowel obstruction. IMPRESSION: 1. Substantially dilated loops of small bowel measuring up to 8 cm in diameter, previously 6 cm in diameter. Scattered air-fluid levels are present in some of the air fluid levels seem to likely be at differing vertical levels in the same loop, favoring small bowel obstruction. 2. Contrast medium is present in the colon and rectum. 3. Nasogastric tube with tip in the stomach body and side port in the distal esophagus. Electronically Signed   By: Ryan Salvage M.D.   On: 05/20/2023 10:57   DG Abd Portable 1V-Small Bowel Obstruction Protocol-initial, 8 hr delay Result Date: 05/20/2023 CLINICAL DATA:  Small-bowel obstruction EXAM: PORTABLE ABDOMEN - 1 VIEW COMPARISON:  05/19/2023 FINDINGS: Gas distended, dilated small bowel within the central abdomen. Dilute contrast material within the colon. The side port of the esophageal catheter is at the gastroesophageal junction and should be advanced by 7 cm. IMPRESSION: 1. Gas distended, dilated small bowel within the central abdomen, compatible with small bowel obstruction. 2. Side port of the esophageal catheter is at the gastroesophageal junction and should be advanced by 7 cm. Electronically Signed   By: Franky Stanford M.D.   On:  05/20/2023 03:48   DG Abd Portable 1V-Small Bowel Protocol-Position Verification Result Date: 05/19/2023 CLINICAL DATA:  Encounter for imaging study to confirm nasogastric tube placement. EXAM: PORTABLE ABDOMEN - 1 VIEW COMPARISON:  CT abdomen and pelvis 05/18/2023 FINDINGS: New nasogastric tube tip overlies the stomach in left upper quadrant. The side port overlies the gastroesophageal junction. Persistent air-filled dilated loops of small bowel visualized within the upper abdomen consistent with the small-bowel obstruction seen on yesterday's CT. Moderate multilevel degenerative disc changes of the thoracic spine with mild dextrocurvature of the mid to lower thoracic spine. IMPRESSION: 1. New nasogastric tube tip overlies the stomach in left upper quadrant. The side port overlies the gastroesophageal junction. 2. Persistent air-filled dilated loops of small bowel within the upper abdomen consistent with the small-bowel obstruction seen on yesterday's CT. Electronically Signed   By: Tanda Lyons M.D.   On: 05/19/2023 16:59   CT ABDOMEN PELVIS W CONTRAST Result Date: 05/18/2023 CLINICAL DATA:  Epigastric abdominal pain. Bilious vomiting. Diarrhea. Peptic ulcer disease. EXAM:  CT ABDOMEN AND PELVIS WITH CONTRAST TECHNIQUE: Multidetector CT imaging of the abdomen and pelvis was performed using the standard protocol following bolus administration of intravenous contrast. RADIATION DOSE REDUCTION: This exam was performed according to the departmental dose-optimization program which includes automated exposure control, adjustment of the mA and/or kV according to patient size and/or use of iterative reconstruction technique. CONTRAST:  OMNIPAQUE  IOHEXOL  300 MG/ML  SOLN COMPARISON:  04/08/2023 FINDINGS: Lower Chest: No acute findings. Hepatobiliary: Mild diffuse hepatic steatosis, increased since previous study. Stable benign-appearing hepatic cysts. No suspicious hepatic masses identified. Gallbladder is  unremarkable. No evidence of biliary ductal dilatation. Pancreas:  No mass or inflammatory changes. Spleen: Within normal limits in size and appearance. Adrenals/Urinary Tract: No suspicious masses identified. No evidence of ureteral calculi or hydronephrosis. Stomach/Bowel: Markedly dilated small bowel is seen, with transition point in the anterior right abdomen just deep to the anterior abdominal wall muscles, most likely due to adhesion. No mass or inflammatory process identified. Diverticulosis is seen mainly involving the descending and sigmoid colon, however there is no evidence of diverticulitis. Vascular/Lymphatic: No pathologically enlarged lymph nodes. No acute vascular findings. Reproductive:  No mass or other significant abnormality. Other: Postop changes again noted from previous left inguinal hernia repair. Musculoskeletal:  No suspicious bone lesions identified. IMPRESSION: High-grade distal small bowel obstruction, with transition point in anterior right abdomen just deep to the anterior abdominal wall muscles, most likely due to adhesion. Colonic diverticulosis, without radiographic evidence of diverticulitis. Mild hepatic steatosis, increased since previous study. Electronically Signed   By: Norleen DELENA Kil M.D.   On: 05/18/2023 18:13        Scheduled Meds:  [MAR Hold] enoxaparin  (LOVENOX ) injection  40 mg Subcutaneous Q24H   [MAR Hold] pantoprazole  (PROTONIX ) IV  40 mg Intravenous Q24H   Continuous Infusions:   LOS: 1 day       Anthony CHRISTELLA Pouch, MD [facility] Pager [phone]  If 7PM-7AM, please contact night-coverage [email or web address] 05/20/2023, 12:18 PM

## 2023-05-20 NOTE — Op Note (Signed)
 Preoperative diagnosis: Small bowel obstruction.   Postoperative diagnosis: Small bowel obstruction  Procedure: Robotic assisted diagnostic laparoscopy                     Small bowel resection  Anesthesia: GETA  Surgeon: Dr. Rodolph, MD  Wound Classification: Clean Contaminated  Indications: Patient is a 73 y.o. male with signs and symptoms of small bowel obstruction.   Findings: 1.  Abrupt stricture of the mid jejunum 2.  No adhesions identify 3.  No sign of metastatic disease                    Description of procedure:  The patient was brought to the operating room and general anesthesia was induced. A time-out was completed verifying correct patient, procedure, site, positioning, and implant(s) and/or special equipment prior to beginning this procedure. Antibiotics were administered prior to making the incision. SCDs placed. The anterior abdominal wall was prepped and draped in the standard sterile fashion.   Palmer's point chosen for entry.  Veress needle placed and abdomen insufflated to 15cm without any dramatic increase in pressure.  Needle removed and optiview technique used to place 8 mm port at same point.  No injury noted during placement. 2 additional ports were placed along left lateral aspect.  Xi robot then docked into place.  Upon evaluation of the abdomen cavity, no lesions were identified.  I put the patient on Trendelenburg position.  Level stable to identify the cecum and start inspecting the small bowel from the ileocecal bulb running proximally.  The mid jejunum there was an abrupt tapering with significant proximal small bowel dilation.  The wall of the small bowel felt thicker than the rest of the intestine so it was decided to proceed with small bowel resection.  The region of the stricture was identified and the extent of resection determined so as to achieve an adequate margin. A window was created by using a curved hemostat to separate the  mesentery from the bowel at each resection margin. The mesentery was scored and serially divided with LigaSure device.   The bowel was divided with a cutting linear stapler at each resection margin and passed off the table as a specimen. The antimesenteric angles of the proximal and distal segments were then approximated with two sutures of 3-0 Vicryl placed approximately 7 cm apart. Enterotomies were made at the antimesenteric borders and the cutting linear stapler inserted and fired. The lumen was inspected for hemostasis. The enterotomies were closed with a single firing of a linear stapler.  The anastomosis was then inspected for patency and integrity. The mesenteric defect was closed with a running 3-0 Vicryl suture. The abdomen was irrigated with 2 L of saline. The remaining small bowel appeared viable.  After the sponge needle and instrument count was correct, the fascia was closed with a running suture of  PDS 0. The skin was closed with skin staples. The patient tolerated the procedure well and was taken to the postanesthesia care unit in stable condition.   Specimen: Small bowel  Complications: None  Estimated Blood Loss: 10 mL

## 2023-05-20 NOTE — Progress Notes (Signed)
Pt stated he doesn't use CPAP anymore since losing weight.

## 2023-05-20 NOTE — Plan of Care (Signed)
   Problem: Coping: Goal: Level of anxiety will decrease Outcome: Progressing   Problem: Pain Managment: Goal: General experience of comfort will improve and/or be controlled Outcome: Progressing   Problem: Safety: Goal: Ability to remain free from injury will improve Outcome: Progressing

## 2023-05-20 NOTE — Anesthesia Procedure Notes (Addendum)
 Procedure Name: Intubation Date/Time: 05/20/2023 11:04 AM  Performed by: Delores Evalene BROCKS, CRNAPre-anesthesia Checklist: Patient identified, Patient being monitored, Timeout performed, Emergency Drugs available and Suction available Patient Re-evaluated:Patient Re-evaluated prior to induction Oxygen Delivery Method: Circle system utilized Preoxygenation: Pre-oxygenation with 100% oxygen Induction Type: IV induction Ventilation: Mask ventilation without difficulty Laryngoscope Size: 3 and McGrath Grade View: Grade I Tube type: Oral Tube size: 7.5 mm Number of attempts: 1 Airway Equipment and Method: Stylet and Video-laryngoscopy Placement Confirmation: ETT inserted through vocal cords under direct vision, positive ETCO2 and breath sounds checked- equal and bilateral Secured at: 23 cm Tube secured with: Tape Dental Injury: Teeth and Oropharynx as per pre-operative assessment

## 2023-05-20 NOTE — Plan of Care (Signed)

## 2023-05-20 NOTE — Anesthesia Preprocedure Evaluation (Signed)
 Anesthesia Evaluation  Patient identified by MRN, date of birth, ID band Patient awake    Reviewed: Allergy & Precautions, NPO status , Patient's Chart, lab work & pertinent test results  History of Anesthesia Complications Negative for: history of anesthetic complications  Airway Mallampati: II  TM Distance: >3 FB Neck ROM: full    Dental no notable dental hx.    Pulmonary sleep apnea and Continuous Positive Airway Pressure Ventilation , Current Smoker and Patient abstained from smoking.   Pulmonary exam normal        Cardiovascular hypertension, On Medications negative cardio ROS Normal cardiovascular exam     Neuro/Psych  PSYCHIATRIC DISORDERS  Depression     Neuromuscular disease    GI/Hepatic negative GI ROS, Neg liver ROS,,,SBO, NGT in place    Endo/Other  Hypothyroidism    Renal/GU      Musculoskeletal   Abdominal   Peds  Hematology negative hematology ROS (+)   Anesthesia Other Findings Past Medical History: No date: Benign localized hyperplasia of prostate with urinary  obstruction No date: Depressive disorder No date: Dyslipidemia No date: Hyperlipidemia No date: Hypertension No date: Hypogammaglobulinemia (HCC) No date: Hypothyroidism No date: Hypothyroidism No date: OSA on CPAP No date: Primary osteoarthritis of left foot No date: Spinal stenosis of lumbar region with neurogenic claudication  Past Surgical History: No date: APPENDECTOMY No date: BACK SURGERY 05/12/2023: BIOPSY     Comment:  Procedure: BIOPSY;  Surgeon: Aundria, Ladell POUR, MD;                Location: [facility];  Service: Gastroenterology;; 05/12/2023: COLONOSCOPY WITH PROPOFOL ; N/A     Comment:  Procedure: COLONOSCOPY WITH PROPOFOL ;  Surgeon: Toledo,               Ladell POUR, MD;  Location: [facility] ENDOSCOPY;  Service:               Gastroenterology;  Laterality: N/A; 05/12/2023: ESOPHAGOGASTRODUODENOSCOPY (EGD) WITH  PROPOFOL ; N/A     Comment:  Procedure: ESOPHAGOGASTRODUODENOSCOPY (EGD) WITH               PROPOFOL ;  Surgeon: Toledo, Ladell POUR, MD;  Location:               [facility] ENDOSCOPY;  Service: Gastroenterology;  Laterality:               N/A; No date: FOOT SURGERY No date: HERNIA REPAIR; Right 08/13/2021: SHOULDER ARTHROSCOPY WITH SUBACROMIAL DECOMPRESSION AND  OPEN ROTATOR C; Left     Comment:  Procedure: Left shoulder arthroscopic biceps tenodesis               with subacromial decompression and mini open rotator cuff              repair;  Surgeon: Tobie Priest, MD;  Location: [facility];  Service: Orthopedics;  Laterality: Left; No date: THYROIDECTOMY, PARTIAL No date: VASECTOMY  BMI    Body Mass Index: 25.71 kg/m      Reproductive/Obstetrics negative OB ROS                             Anesthesia Physical Anesthesia Plan  ASA: 3  Anesthesia Plan: General ETT   Post-op Pain Management: Toradol  IV (intra-op)*, Ofirmev  IV (intra-op)* and Dilaudid  IV   Induction: Intravenous and Rapid sequence  PONV Risk Score and  Plan: 2 and Ondansetron , Dexamethasone , Midazolam  and Treatment may vary due to age or medical condition  Airway Management Planned: Oral ETT  Additional Equipment:   Intra-op Plan:   Post-operative Plan: Extubation in OR  Informed Consent: I have reviewed the patients History and Physical, chart, labs and discussed the procedure including the risks, benefits and alternatives for the proposed anesthesia with the patient or authorized representative who has indicated his/her understanding and acceptance.     Dental Advisory Given  Plan Discussed with: Anesthesiologist, CRNA and Surgeon  Anesthesia Plan Comments: (Patient consented for risks of anesthesia including but not limited to:  - adverse reactions to medications - damage to eyes, teeth, lips or other oral mucosa - nerve damage due to positioning  - sore  throat or hoarseness - Damage to heart, brain, nerves, lungs, other parts of body or loss of life  Patient voiced understanding and assent.)        Anesthesia Quick Evaluation

## 2023-05-21 ENCOUNTER — Inpatient Hospital Stay: Payer: Medicare Other

## 2023-05-21 ENCOUNTER — Encounter: Payer: Self-pay | Admitting: General Surgery

## 2023-05-21 DIAGNOSIS — K56609 Unspecified intestinal obstruction, unspecified as to partial versus complete obstruction: Secondary | ICD-10-CM | POA: Diagnosis not present

## 2023-05-21 LAB — BASIC METABOLIC PANEL
Anion gap: 4 — ABNORMAL LOW (ref 5–15)
BUN: 25 mg/dL — ABNORMAL HIGH (ref 8–23)
CO2: 27 mmol/L (ref 22–32)
Calcium: 7.5 mg/dL — ABNORMAL LOW (ref 8.9–10.3)
Chloride: 105 mmol/L (ref 98–111)
Creatinine, Ser: 0.87 mg/dL (ref 0.61–1.24)
GFR, Estimated: >60 mL/min (ref >60)
Glucose, Bld: 104 mg/dL — ABNORMAL HIGH (ref 70–99)
Potassium: 4 mmol/L (ref 3.5–5.1)
Sodium: 136 mmol/L (ref 135–145)

## 2023-05-21 LAB — CBC
HCT: 31.1 % — ABNORMAL LOW (ref 39.0–52.0)
Hemoglobin: 10.9 g/dL — ABNORMAL LOW (ref 13.0–17.0)
MCH: 32.8 pg (ref 26.0–34.0)
MCHC: 35 g/dL (ref 30.0–36.0)
MCV: 93.7 fL (ref 80.0–100.0)
Platelets: 308 10*3/uL (ref 150–400)
RBC: 3.32 MIL/uL — ABNORMAL LOW (ref 4.22–5.81)
RDW: 13.2 % (ref 11.5–15.5)
WBC: 8.4 10*3/uL (ref 4.0–10.5)
nRBC: 0 % (ref 0.0–0.2)

## 2023-05-21 NOTE — Progress Notes (Signed)
 PROGRESS NOTE    Donald Lee  FMW:[id number] DOB: February 10, 1951 DOA: 05/19/2023 PCP: Steva Clotilda DEL, NP   Assessment & Plan:   Principal Problem:   Small bowel obstruction (HCC)  Assessment and Plan: SBO: CT scan of the abdomen showing findings of high-grade small bowel obstruction. Continue w/ NG tube. No gas or BM yet. S/p small bowel resection 05/20/23 as per gen surg. Pathology is pending. Morphine  prn for pain. Gen surg following and recs apprec    HTN: not on any anti-HTN meds at home. IV hydralazine prn   Hyponatremia: resolved    Hypothyroidism: holding home dose of levothyroxine  while NPO.    HLD: holding statin while NPO    OSA: continue on CPAP qhs      DVT prophylaxis: lovenox   Code Status: full Family Communication: called pt's wife, Nena, no answer so I left a voicemail  Disposition Plan: likely d/c back home   Level of care: Telemetry Medical  Status is: Inpatient Remains inpatient appropriate because: severity of illness, has NG tube still as no gas or BM yet     Consultants:  Gen surg   Procedures:   Antimicrobials:    Subjective: Pt c/o abd pain   Objective: Vitals:   05/20/23 1440 05/20/23 1540 05/20/23 2105 05/21/23 0606  BP: (!) 169/80 (!) 147/79 (!) 153/76 (!) 146/78  Pulse: 62 70 73 66  Resp: 18 18 19 16   Temp: 98.2 F (36.8 C) 98.2 F (36.8 C) 97.9 F (36.6 C) 98.4 F (36.9 C)  TempSrc: Oral  Oral Oral  SpO2: 95% 95% 96% 95%  Weight:      Height:        Intake/Output Summary (Last 24 hours) at 05/21/2023 0907 Last data filed at 05/21/2023 0529 Gross per 24 hour  Intake 2140.94 ml  Output 670 ml  Net 1470.94 ml   Filed Weights   05/19/23 1027  Weight: 83.6 kg    Examination:  General exam: Appears calm but uncomfortable  Respiratory system: clear breath sounds b/l  Cardiovascular system: S1/S2+. No rubs or gallops  Gastrointestinal system: abd is soft, tenderness to palpation & hypoactive bowel sounds   Central nervous system: alert & oriented. Moves all extremities  Psychiatry: Judgement and insight appears normal. Appropriate mood and affect     Data Reviewed: I have personally reviewed following labs and imaging studies  CBC: Recent Labs  Lab 05/19/23 1124 05/20/23 0556 05/21/23 0451  WBC 6.1 5.6 8.4  NEUTROABS 4.4  --   --   HGB 12.2* 11.5* 10.9*  HCT 35.1* 33.3* 31.1*  MCV 94.9 93.8 93.7  PLT 281 291 308   Basic Metabolic Panel: Recent Labs  Lab 05/19/23 1124 05/20/23 0556 05/21/23 0451  NA 132* 133* 136  K 3.9 3.7 4.0  CL 98 103 105  CO2 25 23 27   GLUCOSE 108* 88 104*  BUN 32* 26* 25*  CREATININE 1.02 0.82 0.87  CALCIUM 7.9* 7.9* 7.5*   GFR: Estimated Creatinine Clearance: 80.5 mL/min (by C-G formula based on SCr of 0.87 mg/dL). Liver Function Tests: Recent Labs  Lab 05/19/23 1124  AST 21  ALT 19  ALKPHOS 115  BILITOT 0.6  PROT 4.9*  ALBUMIN 2.8*   No results for input(s): LIPASE, AMYLASE in the last 168 hours. No results for input(s): AMMONIA in the last 168 hours. Coagulation Profile: No results for input(s): INR, PROTIME in the last 168 hours. Cardiac Enzymes: No results for input(s): CKTOTAL, CKMB, CKMBINDEX, TROPONINI in  the last 168 hours. BNP (last 3 results) No results for input(s): PROBNP in the last 8760 hours. HbA1C: No results for input(s): HGBA1C in the last 72 hours. CBG: No results for input(s): GLUCAP in the last 168 hours. Lipid Profile: No results for input(s): CHOL, HDL, LDLCALC, TRIG, CHOLHDL, LDLDIRECT in the last 72 hours. Thyroid Function Tests: No results for input(s): TSH, T4TOTAL, FREET4, T3FREE, THYROIDAB in the last 72 hours. Anemia Panel: No results for input(s): VITAMINB12, FOLATE, FERRITIN, TIBC, IRON, RETICCTPCT in the last 72 hours. Sepsis Labs: No results for input(s): PROCALCITON, LATICACIDVEN in the last 168 hours.  No results found for this  or any previous visit (from the past 240 hours).       Radiology Studies: DG Abd 2 Views Result Date: 05/20/2023 CLINICAL DATA:  Small bowel obstruction. Episode of diarrhea last night. Right-sided abdominal pain EXAM: ABDOMEN - 2 VIEW COMPARISON:  05/20/2023 at 2:40 a.m. FINDINGS: Nasogastric tube observed with tip in the stomach body and side port in the distal esophagus. The lung bases appear clear. Contrast medium is present in the colon and rectum. There are substantially dilated loops of small bowel measuring up to 8 cm in diameter, previously these measured up to 6 cm in diameter. Scattered air-fluid levels are present in some of the air fluid levels seem to likely be at differing vertical levels in the same loop, favoring small bowel obstruction. IMPRESSION: 1. Substantially dilated loops of small bowel measuring up to 8 cm in diameter, previously 6 cm in diameter. Scattered air-fluid levels are present in some of the air fluid levels seem to likely be at differing vertical levels in the same loop, favoring small bowel obstruction. 2. Contrast medium is present in the colon and rectum. 3. Nasogastric tube with tip in the stomach body and side port in the distal esophagus. Electronically Signed   By: Ryan Salvage M.D.   On: 05/20/2023 10:57   DG Abd Portable 1V-Small Bowel Obstruction Protocol-initial, 8 hr delay Result Date: 05/20/2023 CLINICAL DATA:  Small-bowel obstruction EXAM: PORTABLE ABDOMEN - 1 VIEW COMPARISON:  05/19/2023 FINDINGS: Gas distended, dilated small bowel within the central abdomen. Dilute contrast material within the colon. The side port of the esophageal catheter is at the gastroesophageal junction and should be advanced by 7 cm. IMPRESSION: 1. Gas distended, dilated small bowel within the central abdomen, compatible with small bowel obstruction. 2. Side port of the esophageal catheter is at the gastroesophageal junction and should be advanced by 7 cm. Electronically  Signed   By: Franky Stanford M.D.   On: 05/20/2023 03:48   DG Abd Portable 1V-Small Bowel Protocol-Position Verification Result Date: 05/19/2023 CLINICAL DATA:  Encounter for imaging study to confirm nasogastric tube placement. EXAM: PORTABLE ABDOMEN - 1 VIEW COMPARISON:  CT abdomen and pelvis 05/18/2023 FINDINGS: New nasogastric tube tip overlies the stomach in left upper quadrant. The side port overlies the gastroesophageal junction. Persistent air-filled dilated loops of small bowel visualized within the upper abdomen consistent with the small-bowel obstruction seen on yesterday's CT. Moderate multilevel degenerative disc changes of the thoracic spine with mild dextrocurvature of the mid to lower thoracic spine. IMPRESSION: 1. New nasogastric tube tip overlies the stomach in left upper quadrant. The side port overlies the gastroesophageal junction. 2. Persistent air-filled dilated loops of small bowel within the upper abdomen consistent with the small-bowel obstruction seen on yesterday's CT. Electronically Signed   By: Tanda Lyons M.D.   On: 05/19/2023 16:59  Scheduled Meds:  enoxaparin  (LOVENOX ) injection  40 mg Subcutaneous Q24H   pantoprazole  (PROTONIX ) IV  40 mg Intravenous Q24H   Continuous Infusions:  sodium chloride  75 mL/hr at 05/21/23 0529     LOS: 2 days       Anthony CHRISTELLA Pouch, MD [facility] Pager [phone]  If 7PM-7AM, please contact night-coverage [email or web address] 05/21/2023, 9:07 AM

## 2023-05-21 NOTE — Anesthesia Postprocedure Evaluation (Signed)
 Anesthesia Post Note  Patient: Donald Lee  Procedure(s) Performed: XI ROBOT ASSISTED DIAGNOSTIC LAPAROSCOPY (Abdomen) SMALL BOWEL RESECTION (Abdomen)  Patient location during evaluation: PACU Anesthesia Type: General Level of consciousness: awake and alert Pain management: pain level controlled Vital Signs Assessment: post-procedure vital signs reviewed and stable Respiratory status: spontaneous breathing, nonlabored ventilation, respiratory function stable and patient connected to nasal cannula oxygen Cardiovascular status: blood pressure returned to baseline and stable Postop Assessment: no apparent nausea or vomiting Anesthetic complications: no   No notable events documented.   Last Vitals:  Vitals:   05/20/23 2105 05/21/23 0606  BP: (!) 153/76 (!) 146/78  Pulse: 73 66  Resp: 19 16  Temp: 36.6 C 36.9 C  SpO2: 96% 95%    Last Pain:  Vitals:   05/21/23 0606  TempSrc: Oral  PainSc:                  [name]

## 2023-05-21 NOTE — Progress Notes (Signed)
 Patient ID: Donald Lee, male   DOB: 1950/10/16, 73 y.o.   MRN: [id number]     SURGICAL PROGRESS NOTE   Hospital Day(s): 2.   Interval History: Patient seen and examined, no acute events or new complaints overnight. Patient reports feeling sore and pain in the surgical area.  Endorses he is not passing gas.  He feel bloated but not able to pass gas.  He tried to slide down a toilet but no gas or stool yet.  Denies any nausea.  Vital signs in last 24 hours: [min-max] current  Temp:  [97.8 F (36.6 C)-98.4 F (36.9 C)] 98.4 F (36.9 C) (02/06 0606) Pulse Rate:  [58-73] 66 (02/06 0606) Resp:  [13-19] 16 (02/06 0606) BP: (146-169)/(67-98) 146/78 (02/06 0606) SpO2:  [93 %-99 %] 95 % (02/06 0606)     Height: 5' 11 (180.3 cm) Weight: 83.6 kg BMI (Calculated): 25.72   Physical Exam:  Constitutional: alert, cooperative and no distress  Respiratory: breathing non-labored at rest  Cardiovascular: regular rate and sinus rhythm  Gastrointestinal: soft, non-tender, but mild distended  Labs:     Latest Ref Rng & Units 05/21/2023    4:51 AM 05/20/2023    5:56 AM 05/19/2023   11:24 AM  CBC  WBC 4.0 - 10.5 K/uL 8.4  5.6  6.1   Hemoglobin 13.0 - 17.0 g/dL 89.0  88.4  87.7   Hematocrit 39.0 - 52.0 % 31.1  33.3  35.1   Platelets 150 - 400 K/uL 308  291  281       Latest Ref Rng & Units 05/21/2023    4:51 AM 05/20/2023    5:56 AM 05/19/2023   11:24 AM  CMP  Glucose 70 - 99 mg/dL 895  88  891   BUN 8 - 23 mg/dL 25  26  32   Creatinine 0.61 - 1.24 mg/dL 9.12  9.17  8.97   Sodium 135 - 145 mmol/L 136  133  132   Potassium 3.5 - 5.1 mmol/L 4.0  3.7  3.9   Chloride 98 - 111 mmol/L 105  103  98   CO2 22 - 32 mmol/L 27  23  25    Calcium 8.9 - 10.3 mg/dL 7.5  7.9  7.9   Total Protein 6.5 - 8.1 g/dL   4.9   Total Bilirubin 0.0 - 1.2 mg/dL   0.6   Alkaline Phos 38 - 126 U/L   115   AST 15 - 41 U/L   21   ALT 0 - 44 U/L   19     Imaging studies: Abdominal x-ray this morning shows improved bowel  dilation.  Lot of gas in large intestine.  Still mildly dilated small intestine   Assessment/Plan:  73 y.o. male with small bowel obstruction s/p small bowel resection postop day #1, complicated by pertinent comorbidities including: Hypertension, chronic gastritis, chronic diarrhea.   -Clinically doing well on postoperative day #1 -Stable vital signs.  Stable labs -Still not passing gas.  Expected ileus after bowel resection from bowel obstruction -Will continue NGT in place until return of bowel function -Encourage patient to ambulate -Continue pain management -Continue DVT prophylaxis  Lucas Petrin, MD

## 2023-05-22 DIAGNOSIS — K56609 Unspecified intestinal obstruction, unspecified as to partial versus complete obstruction: Secondary | ICD-10-CM | POA: Diagnosis not present

## 2023-05-22 LAB — BASIC METABOLIC PANEL
Anion gap: 9 (ref 5–15)
BUN: 21 mg/dL (ref 8–23)
CO2: 26 mmol/L (ref 22–32)
Calcium: 7.8 mg/dL — ABNORMAL LOW (ref 8.9–10.3)
Chloride: 100 mmol/L (ref 98–111)
Creatinine, Ser: 0.86 mg/dL (ref 0.61–1.24)
GFR, Estimated: >60 mL/min (ref >60)
Glucose, Bld: 86 mg/dL (ref 70–99)
Potassium: 3.9 mmol/L (ref 3.5–5.1)
Sodium: 135 mmol/L (ref 135–145)

## 2023-05-22 LAB — SURGICAL PATHOLOGY

## 2023-05-22 LAB — CBC
HCT: 32 % — ABNORMAL LOW (ref 39.0–52.0)
Hemoglobin: 10.9 g/dL — ABNORMAL LOW (ref 13.0–17.0)
MCH: 32.3 pg (ref 26.0–34.0)
MCHC: 34.1 g/dL (ref 30.0–36.0)
MCV: 95 fL (ref 80.0–100.0)
Platelets: 325 10*3/uL (ref 150–400)
RBC: 3.37 MIL/uL — ABNORMAL LOW (ref 4.22–5.81)
RDW: 13.2 % (ref 11.5–15.5)
WBC: 8.3 10*3/uL (ref 4.0–10.5)
nRBC: 0 % (ref 0.0–0.2)

## 2023-05-22 NOTE — Progress Notes (Signed)
 Patient ID: Donald Lee, male   DOB: 1951-02-17, 73 y.o.   MRN: [id number]     SURGICAL PROGRESS NOTE   Hospital Day(s): 3.   Interval History: Patient seen and examined, no acute events or new complaints overnight. Patient reports feeling much better today.  He endorses that he has been passing gas during the night.  Denies any nausea or vomiting.  Still not having bowel movement.  Pain better controlled.  Vital signs in last 24 hours: [min-max] current  Temp:  [98.1 F (36.7 C)-98.2 F (36.8 C)] 98.1 F (36.7 C) (02/07 0306) Pulse Rate:  [64-66] 64 (02/07 0306) Resp:  [16-18] 16 (02/07 0306) BP: (155-161)/(78-84) 161/78 (02/07 0306) SpO2:  [95 %-96 %] 95 % (02/07 0306)     Height: 5' 11 (180.3 cm) Weight: 83.6 kg BMI (Calculated): 25.72   Physical Exam:  Constitutional: alert, cooperative and no distress  Respiratory: breathing non-labored at rest  Cardiovascular: regular rate and sinus rhythm  Gastrointestinal: soft, non-tender, and non-distended  Labs:     Latest Ref Rng & Units 05/22/2023    6:07 AM 05/21/2023    4:51 AM 05/20/2023    5:56 AM  CBC  WBC 4.0 - 10.5 K/uL 8.3  8.4  5.6   Hemoglobin 13.0 - 17.0 g/dL 89.0  89.0  88.4   Hematocrit 39.0 - 52.0 % 32.0  31.1  33.3   Platelets 150 - 400 K/uL 325  308  291       Latest Ref Rng & Units 05/22/2023    6:07 AM 05/21/2023    4:51 AM 05/20/2023    5:56 AM  CMP  Glucose 70 - 99 mg/dL 86  895  88   BUN 8 - 23 mg/dL 21  25  26    Creatinine 0.61 - 1.24 mg/dL 9.13  9.12  9.17   Sodium 135 - 145 mmol/L 135  136  133   Potassium 3.5 - 5.1 mmol/L 3.9  4.0  3.7   Chloride 98 - 111 mmol/L 100  105  103   CO2 22 - 32 mmol/L 26  27  23    Calcium 8.9 - 10.3 mg/dL 7.8  7.5  7.9     Imaging studies: No new pertinent imaging studies   Assessment/Plan:  73 y.o. male with small bowel obstruction s/p small bowel resection postop day #2, complicated by pertinent comorbidities including: Hypertension, chronic gastritis, chronic  diarrhea.    -Clinically doing well on postoperative day #2 -Stable vital signs.  Stable labs -Resolving ileus.  Passing gas. -NGT removed.  Will select a liquid diet -Encourage patient to ambulate -Continue pain management -Continue DVT prophylaxis  Lucas Petrin, MD

## 2023-05-22 NOTE — Progress Notes (Signed)
 PROGRESS NOTE    Donald Lee  FMW:[id number] DOB: [birth year] DOA: 05/19/2023 PCP: Steva Clotilda DEL, NP   Assessment & Plan:   Principal Problem:   Small bowel obstruction (HCC)  Assessment and Plan: SBO: CT scan of the abdomen showing findings of high-grade small bowel obstruction. S/p small bowel resection 05/20/23 as per gen surg. Pathology is neg for malignancy but showed area of recurrent ulcers. No further inpatient or outpatient GI work-up needed as per GI. No more NSAIDs. Norco, morphine  prn for pain. Gen surg following and recs apprec    HTN: not on any anti-HTN meds at home. IV hydralazine prn   Hyponatremia: resolved    Hypothyroidism: will restart home dose of levothyroxine     HLD: restart statin    OSA: continue on CPAP qhs      DVT prophylaxis: lovenox   Code Status: full Family Communication: discussed pt's care w/ pt's family at bedside and answered their questions  Disposition Plan: likely d/c back home   Level of care: Telemetry Medical  Status is: Inpatient Remains inpatient appropriate because: severity of illness, NG tube removed and started on CLD as per gen surg     Consultants:  Gen surg   Procedures:   Antimicrobials:    Subjective: Pt c/o fatigue   Objective: Vitals:   05/21/23 0606 05/21/23 1611 05/21/23 1956 05/22/23 0306  BP: (!) 146/78 (!) 155/79 (!) 159/84 (!) 161/78  Pulse: 66 66 65 64  Resp: 16 18 16 16   Temp: 98.4 F (36.9 C) 98.2 F (36.8 C) 98.2 F (36.8 C) 98.1 F (36.7 C)  TempSrc: Oral  Oral Oral  SpO2: 95%  96% 95%  Weight:      Height:        Intake/Output Summary (Last 24 hours) at 05/22/2023 1541 Last data filed at 05/22/2023 1423 Gross per 24 hour  Intake 270 ml  Output 600 ml  Net -330 ml   Filed Weights   05/19/23 1027  Weight: 83.6 kg    Examination:  General exam:  appears comfortable   Respiratory system: clear breath sounds b/l  Cardiovascular system: S1 & S2+. No rubs or clicks   Gastrointestinal system: abd is soft, NT, ND & hypoactive bowel sounds  Central nervous system: alert & oriented. Moves all extremities   Psychiatry: Judgement and insight appears normal. Appropriate mood and affect    Data Reviewed: I have personally reviewed following labs and imaging studies  CBC: Recent Labs  Lab 05/19/23 1124 05/20/23 0556 05/21/23 0451 05/22/23 0607  WBC 6.1 5.6 8.4 8.3  NEUTROABS 4.4  --   --   --   HGB 12.2* 11.5* 10.9* 10.9*  HCT 35.1* 33.3* 31.1* 32.0*  MCV 94.9 93.8 93.7 95.0  PLT 281 291 308 325   Basic Metabolic Panel: Recent Labs  Lab 05/19/23 1124 05/20/23 0556 05/21/23 0451 05/22/23 0607  NA 132* 133* 136 135  K 3.9 3.7 4.0 3.9  CL 98 103 105 100  CO2 25 23 27 26   GLUCOSE 108* 88 104* 86  BUN 32* 26* 25* 21  CREATININE 1.02 0.82 0.87 0.86  CALCIUM 7.9* 7.9* 7.5* 7.8*   GFR: Estimated Creatinine Clearance: 81.5 mL/min (by C-G formula based on SCr of 0.86 mg/dL). Liver Function Tests: Recent Labs  Lab 05/19/23 1124  AST 21  ALT 19  ALKPHOS 115  BILITOT 0.6  PROT 4.9*  ALBUMIN 2.8*   No results for input(s): LIPASE, AMYLASE in the last 168 hours. No  results for input(s): AMMONIA in the last 168 hours. Coagulation Profile: No results for input(s): INR, PROTIME in the last 168 hours. Cardiac Enzymes: No results for input(s): CKTOTAL, CKMB, CKMBINDEX, TROPONINI in the last 168 hours. BNP (last 3 results) No results for input(s): PROBNP in the last 8760 hours. HbA1C: No results for input(s): HGBA1C in the last 72 hours. CBG: No results for input(s): GLUCAP in the last 168 hours. Lipid Profile: No results for input(s): CHOL, HDL, LDLCALC, TRIG, CHOLHDL, LDLDIRECT in the last 72 hours. Thyroid Function Tests: No results for input(s): TSH, T4TOTAL, FREET4, T3FREE, THYROIDAB in the last 72 hours. Anemia Panel: No results for input(s): VITAMINB12, FOLATE, FERRITIN, TIBC,  IRON, RETICCTPCT in the last 72 hours. Sepsis Labs: No results for input(s): PROCALCITON, LATICACIDVEN in the last 168 hours.  No results found for this or any previous visit (from the past 240 hours).       Radiology Studies: DG Abd 2 Views Result Date: 05/21/2023 CLINICAL DATA:  Small bowel obstruction EXAM: ABDOMEN - 2 VIEW COMPARISON:  05/20/2023 FINDINGS: NG tube remains in the stomach. Air and contrast material noted throughout the colon. Continued small bowel dilatation in the mid abdomen measuring approximately 5 cm in diameter compared 8 cm previously. No free air or organomegaly. IMPRESSION: Continued small bowel dilatation, slightly decreased in diameter since prior study. This likely reflects improving but persistent small bowel obstruction. NG tube in the stomach. Electronically Signed   By: Franky Crease M.D.   On: 05/21/2023 11:53        Scheduled Meds:  enoxaparin  (LOVENOX ) injection  40 mg Subcutaneous Q24H   pantoprazole  (PROTONIX ) IV  40 mg Intravenous Q24H   Continuous Infusions:     LOS: 3 days       Anthony CHRISTELLA Pouch, MD [facility] Pager [phone]  If 7PM-7AM, please contact night-coverage [email or web address] 05/22/2023, 3:41 PM

## 2023-05-23 DIAGNOSIS — K56609 Unspecified intestinal obstruction, unspecified as to partial versus complete obstruction: Secondary | ICD-10-CM | POA: Diagnosis not present

## 2023-05-23 LAB — CBC
HCT: 32.1 % — ABNORMAL LOW (ref 39.0–52.0)
Hemoglobin: 11.2 g/dL — ABNORMAL LOW (ref 13.0–17.0)
MCH: 32.8 pg (ref 26.0–34.0)
MCHC: 34.9 g/dL (ref 30.0–36.0)
MCV: 94.1 fL (ref 80.0–100.0)
Platelets: 304 10*3/uL (ref 150–400)
RBC: 3.41 MIL/uL — ABNORMAL LOW (ref 4.22–5.81)
RDW: 13 % (ref 11.5–15.5)
WBC: 6.8 10*3/uL (ref 4.0–10.5)
nRBC: 0 % (ref 0.0–0.2)

## 2023-05-23 LAB — BASIC METABOLIC PANEL
Anion gap: 8 (ref 5–15)
BUN: 17 mg/dL (ref 8–23)
CO2: 26 mmol/L (ref 22–32)
Calcium: 7.9 mg/dL — ABNORMAL LOW (ref 8.9–10.3)
Chloride: 99 mmol/L (ref 98–111)
Creatinine, Ser: 0.77 mg/dL (ref 0.61–1.24)
GFR, Estimated: >60 mL/min (ref >60)
Glucose, Bld: 80 mg/dL (ref 70–99)
Potassium: 3.8 mmol/L (ref 3.5–5.1)
Sodium: 133 mmol/L — ABNORMAL LOW (ref 135–145)

## 2023-05-23 MED ORDER — LEVOTHYROXINE SODIUM 112 MCG PO TABS
224.0000 ug | ORAL_TABLET | Freq: Every day | ORAL | Status: DC
  Administered 2023-05-24 – 2023-05-25 (×2): 224 ug via ORAL
  Filled 2023-05-23 (×2): qty 2

## 2023-05-23 MED ORDER — LEVOTHYROXINE SODIUM 112 MCG PO TABS: 112.0000 ug | ORAL_TABLET | Freq: Every day | ORAL | Status: DC

## 2023-05-23 MED ORDER — HYDROCODONE-ACETAMINOPHEN 5-325 MG PO TABS
1.0000 | ORAL_TABLET | ORAL | Status: DC | PRN
  Administered 2023-05-24 – 2023-05-25 (×3): 1 via ORAL
  Filled 2023-05-23 (×3): qty 1

## 2023-05-23 NOTE — Plan of Care (Signed)

## 2023-05-23 NOTE — Progress Notes (Signed)
 05/23/2023  Subjective: No acute events.  Patient is doing well and having flatus, ambulating in the hallways.  Denies any nausea or vomiting.  No BM.  Vital signs: Temp:  [98 F (36.7 C)-98.2 F (36.8 C)] 98 F (36.7 C) (02/08 0822) Pulse Rate:  [55-62] 62 (02/08 0822) Resp:  [16-18] 16 (02/08 0822) BP: (158-162)/(77-85) 158/85 (02/08 0822) SpO2:  [94 %-100 %] 100 % (02/08 0822)   Intake/Output: 02/07 0701 - 02/08 0700 In: 240 [P.O.:240] Out: -  Last BM Date : 05/20/23  Physical Exam: Constitutional:  No acute distress Abdomen:  soft, non-distended, appropriately tender.  Incisions are clean, dry, intact.  Labs:  Recent Labs    05/22/23 0607 05/23/23 0525  WBC 8.3 6.8  HGB 10.9* 11.2*  HCT 32.0* 32.1*  PLT 325 304   Recent Labs    05/22/23 0607 05/23/23 0525  NA 135 133*  K 3.9 3.8  CL 100 99  CO2 26 26  GLUCOSE 86 80  BUN 21 17  CREATININE 0.86 0.77  CALCIUM 7.8* 7.9*   No results for input(s): LABPROT, INR in the last 72 hours.  Imaging: No results found.  Assessment/Plan: This is a 73 y.o. male robotic assisted small bowel resection.  --Patient continues to improve and is having flatus and ambulating.  Tolerated clear liquids without issues.  No BM yet. --Will advance to full liquids for now.  If he has a BM today, could advance to soft diet for dinner. --Continue ambulation and DVT prophylaxis.   I spent 35 minutes dedicated to the care of this patient on the date of this encounter to include pre-visit review of records, face-to-face time with the patient discussing diagnosis and management, and any post-visit coordination of care.  Aloysius Sheree Plant, MD [facility]

## 2023-05-23 NOTE — Progress Notes (Signed)
 PROGRESS NOTE    Donald Lee  FMW:[id number] DOB: 12-Jan-1951 DOA: 05/19/2023 PCP: Steva Clotilda DEL, NP   Assessment & Plan:   Principal Problem:   Small bowel obstruction (HCC)  Assessment and Plan: SBO: CT scan of the abdomen showing findings of high-grade small bowel obstruction. S/p small bowel resection 05/20/23 as per gen surg. Pathology is neg for malignancy but showed area of recurrent ulcers. No further inpatient or outpatient GI work-up needed as per GI. No more NSAIDs. Norco, morphine  prn for pain. Diet advanced to full liquid. Gen surg following and recs apprec   HTN: not on any anti-HTN meds at home. IV hydralazine prn   Hyponatremia: resolved    Hypothyroidism: restart home dose of levothyroxine     HLD: continue on statin    OSA: continue on CPAP qhs      DVT prophylaxis: lovenox   Code Status: full Family Communication:  Disposition Plan: likely d/c back home   Level of care: Telemetry Medical  Status is: Inpatient Remains inpatient appropriate because: severity of illness, diet advanced to full liquid today     Consultants:  Gen surg   Procedures:   Antimicrobials:    Subjective: Pt c/o intermittent nausea   Objective: Vitals:   05/22/23 0306 05/22/23 2038 05/23/23 0354 05/23/23 0822  BP: (!) 161/78 (!) 162/82 (!) 160/77 (!) 158/85  Pulse: 64 (!) 59 (!) 55 62  Resp: 16 18 18 16   Temp: 98.1 F (36.7 C) 98 F (36.7 C) 98.2 F (36.8 C) 98 F (36.7 C)  TempSrc: Oral Oral Oral Oral  SpO2: 95% 94% 97% 100%  Weight:      Height:        Intake/Output Summary (Last 24 hours) at 05/23/2023 0841 Last data filed at 05/22/2023 1423 Gross per 24 hour  Intake 240 ml  Output --  Net 240 ml   Filed Weights   05/19/23 1027  Weight: 83.6 kg    Examination:  General exam:  appears calm & comfortable  Respiratory system: clear breath sounds b/l  Cardiovascular system: S1/S2+. No rubs or clicks  Gastrointestinal system: abd is soft, NT, ND,  hypoactive bowel sounds   Central nervous system: alert & oriented. Moves all extremities  Psychiatry: Judgement and insight appears normal. Appropriate mood and affect     Data Reviewed: I have personally reviewed following labs and imaging studies  CBC: Recent Labs  Lab 05/19/23 1124 05/20/23 0556 05/21/23 0451 05/22/23 0607 05/23/23 0525  WBC 6.1 5.6 8.4 8.3 6.8  NEUTROABS 4.4  --   --   --   --   HGB 12.2* 11.5* 10.9* 10.9* 11.2*  HCT 35.1* 33.3* 31.1* 32.0* 32.1*  MCV 94.9 93.8 93.7 95.0 94.1  PLT 281 291 308 325 304   Basic Metabolic Panel: Recent Labs  Lab 05/19/23 1124 05/20/23 0556 05/21/23 0451 05/22/23 0607 05/23/23 0525  NA 132* 133* 136 135 133*  K 3.9 3.7 4.0 3.9 3.8  CL 98 103 105 100 99  CO2 25 23 27 26 26   GLUCOSE 108* 88 104* 86 80  BUN 32* 26* 25* 21 17  CREATININE 1.02 0.82 0.87 0.86 0.77  CALCIUM 7.9* 7.9* 7.5* 7.8* 7.9*   GFR: Estimated Creatinine Clearance: 87.6 mL/min (by C-G formula based on SCr of 0.77 mg/dL). Liver Function Tests: Recent Labs  Lab 05/19/23 1124  AST 21  ALT 19  ALKPHOS 115  BILITOT 0.6  PROT 4.9*  ALBUMIN 2.8*   No results for input(s):  LIPASE, AMYLASE in the last 168 hours. No results for input(s): AMMONIA in the last 168 hours. Coagulation Profile: No results for input(s): INR, PROTIME in the last 168 hours. Cardiac Enzymes: No results for input(s): CKTOTAL, CKMB, CKMBINDEX, TROPONINI in the last 168 hours. BNP (last 3 results) No results for input(s): PROBNP in the last 8760 hours. HbA1C: No results for input(s): HGBA1C in the last 72 hours. CBG: No results for input(s): GLUCAP in the last 168 hours. Lipid Profile: No results for input(s): CHOL, HDL, LDLCALC, TRIG, CHOLHDL, LDLDIRECT in the last 72 hours. Thyroid Function Tests: No results for input(s): TSH, T4TOTAL, FREET4, T3FREE, THYROIDAB in the last 72 hours. Anemia Panel: No results for input(s):  VITAMINB12, FOLATE, FERRITIN, TIBC, IRON, RETICCTPCT in the last 72 hours. Sepsis Labs: No results for input(s): PROCALCITON, LATICACIDVEN in the last 168 hours.  No results found for this or any previous visit (from the past 240 hours).       Radiology Studies: No results found.       Scheduled Meds:  enoxaparin  (LOVENOX ) injection  40 mg Subcutaneous Q24H   pantoprazole  (PROTONIX ) IV  40 mg Intravenous Q24H   Continuous Infusions:     LOS: 4 days       Anthony CHRISTELLA Pouch, MD [facility] Pager [phone]  If 7PM-7AM, please contact night-coverage [email or web address] 05/23/2023, 8:41 AM

## 2023-05-24 DIAGNOSIS — K56609 Unspecified intestinal obstruction, unspecified as to partial versus complete obstruction: Secondary | ICD-10-CM | POA: Diagnosis not present

## 2023-05-24 LAB — CBC
HCT: 34.5 % — ABNORMAL LOW (ref 39.0–52.0)
Hemoglobin: 11.9 g/dL — ABNORMAL LOW (ref 13.0–17.0)
MCH: 32.2 pg (ref 26.0–34.0)
MCHC: 34.5 g/dL (ref 30.0–36.0)
MCV: 93.2 fL (ref 80.0–100.0)
Platelets: 387 10*3/uL (ref 150–400)
RBC: 3.7 MIL/uL — ABNORMAL LOW (ref 4.22–5.81)
RDW: 12.8 % (ref 11.5–15.5)
WBC: 6.4 10*3/uL (ref 4.0–10.5)
nRBC: 0 % (ref 0.0–0.2)

## 2023-05-24 LAB — BASIC METABOLIC PANEL
Anion gap: 7 (ref 5–15)
BUN: 18 mg/dL (ref 8–23)
CO2: 29 mmol/L (ref 22–32)
Calcium: 8.3 mg/dL — ABNORMAL LOW (ref 8.9–10.3)
Chloride: 97 mmol/L — ABNORMAL LOW (ref 98–111)
Creatinine, Ser: 0.95 mg/dL (ref 0.61–1.24)
GFR, Estimated: >60 mL/min (ref >60)
Glucose, Bld: 89 mg/dL (ref 70–99)
Potassium: 4.2 mmol/L (ref 3.5–5.1)
Sodium: 133 mmol/L — ABNORMAL LOW (ref 135–145)

## 2023-05-24 MED ORDER — PROCHLORPERAZINE EDISYLATE 10 MG/2ML IJ SOLN
5.0000 mg | INTRAMUSCULAR | Status: DC | PRN
  Administered 2023-05-24 (×2): 5 mg via INTRAVENOUS
  Filled 2023-05-24 (×2): qty 2

## 2023-05-24 MED ORDER — SCOPOLAMINE 1 MG/3DAYS TD PT72
1.0000 | MEDICATED_PATCH | TRANSDERMAL | Status: DC
  Administered 2023-05-24: 1.5 mg via TRANSDERMAL
  Filled 2023-05-24: qty 1

## 2023-05-24 NOTE — Progress Notes (Signed)
 Patient ID: Donald Lee, male   DOB: Mar 03, 1951, 73 y.o.   MRN: [id number]     SURGICAL PROGRESS NOTE   Hospital Day(s): 5.   Interval History: Patient seen and examined, no acute events or new complaints overnight. Patient reports feeling well.  He continue passing gas.  He still does not have bowel movement.  Denies any significant pain.  He does feel nauseated after walking.  No nausea when laying down or resting.  No nausea after eating.  No vomiting.  Vital signs in last 24 hours: [min-max] current  Temp:  [97.7 F (36.5 C)-98.3 F (36.8 C)] 98.1 F (36.7 C) (02/09 0745) Pulse Rate:  [55-57] 56 (02/09 0745) Resp:  [16-17] 17 (02/09 0745) BP: (155-166)/(76-88) 155/88 (02/09 0745) SpO2:  [96 %-98 %] 96 % (02/09 0745)     Height: 5' 11 (180.3 cm) Weight: 83.6 kg BMI (Calculated): 25.72   Physical Exam:  Constitutional: alert, cooperative and no distress  Respiratory: breathing non-labored at rest  Cardiovascular: regular rate and sinus rhythm  Gastrointestinal: soft, non-tender, and non-distended  Labs:     Latest Ref Rng & Units 05/24/2023    5:09 AM 05/23/2023    5:25 AM 05/22/2023    6:07 AM  CBC  WBC 4.0 - 10.5 K/uL 6.4  6.8  8.3   Hemoglobin 13.0 - 17.0 g/dL 88.0  88.7  89.0   Hematocrit 39.0 - 52.0 % 34.5  32.1  32.0   Platelets 150 - 400 K/uL 387  304  325       Latest Ref Rng & Units 05/24/2023    5:09 AM 05/23/2023    5:25 AM 05/22/2023    6:07 AM  CMP  Glucose 70 - 99 mg/dL 89  80  86   BUN 8 - 23 mg/dL 18  17  21    Creatinine 0.61 - 1.24 mg/dL 9.04  9.22  9.13   Sodium 135 - 145 mmol/L 133  133  135   Potassium 3.5 - 5.1 mmol/L 4.2  3.8  3.9   Chloride 98 - 111 mmol/L 97  99  100   CO2 22 - 32 mmol/L 29  26  26    Calcium 8.9 - 10.3 mg/dL 8.3  7.9  7.8     Imaging studies: No new pertinent imaging studies   Assessment/Plan:  73 y.o. male with small bowel obstruction s/p small bowel resection postop day #4, complicated by pertinent comorbidities including:  Hypertension, chronic gastritis, chronic diarrhea.    -Continue clinically doing well.  No fever, stable vital signs -Continue passing gas with abdominal physical exam with soft abdomen -Will advance diet to soft diet. -Honestly I cannot explain patient nausea after walking.  May be some orthostatic symptoms.  Patient encouraged to drink plenty of water . -Final pathology shows acute over chronic ulcer as a cause of stricture and obstruction.  Avoid NSAIDs. -Encourage patient to ambulate -Continue pain management -Continue DVT prophylaxis  Lucas Petrin, MD

## 2023-05-24 NOTE — Progress Notes (Signed)
 PROGRESS NOTE    Donald Lee  FMW:[id number] DOB: [birth year] DOA: 05/19/2023 PCP: Steva Clotilda DEL, NP   Assessment & Plan:   Principal Problem:   Small bowel obstruction (HCC)  Assessment and Plan: SBO: CT scan of the abdomen showing findings of high-grade small bowel obstruction. S/p small bowel resection 05/20/23 as per gen surg. Pathology is neg for malignancy but showed area of recurrent ulcers. No further inpatient or outpatient GI work-up needed as per GI. No more NSAIDs. Norco, morphine  prn for pain. Diet advanced to soft diet. Still w/ nausea. Zofran  & compazine  prn. Started on scopolamine  patch. Has gas but no BM yet. Gen surg following and recs apprec   HTN: not on any anti-HTN meds at home. IV hydralazine prn   Hyponatremia: resolved    Hypothyroidism: continue on levothyroxine     HLD: continue on statin    OSA: CPAP qhs      DVT prophylaxis: lovenox   Code Status: full Family Communication:  Disposition Plan: likely d/c back home   Level of care: Telemetry Medical  Status is: Inpatient Remains inpatient appropriate because: severity of illness, diet advanced to soft diet today     Consultants:  Gen surg   Procedures:   Antimicrobials:    Subjective: Pt c/o nausea.   Objective: Vitals:   05/23/23 1548 05/23/23 2141 05/24/23 0659 05/24/23 0745  BP: (!) 166/77 (!) 159/76 (!) 164/78 (!) 155/88  Pulse: (!) 57 (!) 55 (!) 56 (!) 56  Resp: 16 16 16 17   Temp: 97.7 F (36.5 C) 98.3 F (36.8 C) 97.8 F (36.6 C) 98.1 F (36.7 C)  TempSrc: Oral Oral Oral Oral  SpO2: 96% 98% 97% 96%  Weight:      Height:       No intake or output data in the 24 hours ending 05/24/23 0854  Filed Weights   05/19/23 1027  Weight: 83.6 kg    Examination:  General exam:  appears uncomfortable  Respiratory system: clear breath sounds b/l  Cardiovascular system: S1 & S2+. No rubs or clicks  Gastrointestinal system: abd is soft, NT, ND & hypoactive bowel sounds   Central nervous system: alert & oriented. Moves all extremities  Psychiatry: Judgement and insight appears normal. Flat mood and affect    Data Reviewed: I have personally reviewed following labs and imaging studies  CBC: Recent Labs  Lab 05/19/23 1124 05/20/23 0556 05/21/23 0451 05/22/23 0607 05/23/23 0525 05/24/23 0509  WBC 6.1 5.6 8.4 8.3 6.8 6.4  NEUTROABS 4.4  --   --   --   --   --   HGB 12.2* 11.5* 10.9* 10.9* 11.2* 11.9*  HCT 35.1* 33.3* 31.1* 32.0* 32.1* 34.5*  MCV 94.9 93.8 93.7 95.0 94.1 93.2  PLT 281 291 308 325 304 387   Basic Metabolic Panel: Recent Labs  Lab 05/20/23 0556 05/21/23 0451 05/22/23 0607 05/23/23 0525 05/24/23 0509  NA 133* 136 135 133* 133*  K 3.7 4.0 3.9 3.8 4.2  CL 103 105 100 99 97*  CO2 23 27 26 26 29   GLUCOSE 88 104* 86 80 89  BUN 26* 25* 21 17 18   CREATININE 0.82 0.87 0.86 0.77 0.95  CALCIUM 7.9* 7.5* 7.8* 7.9* 8.3*   GFR: Estimated Creatinine Clearance: 73.8 mL/min (by C-G formula based on SCr of 0.95 mg/dL). Liver Function Tests: Recent Labs  Lab 05/19/23 1124  AST 21  ALT 19  ALKPHOS 115  BILITOT 0.6  PROT 4.9*  ALBUMIN 2.8*  No results for input(s): LIPASE, AMYLASE in the last 168 hours. No results for input(s): AMMONIA in the last 168 hours. Coagulation Profile: No results for input(s): INR, PROTIME in the last 168 hours. Cardiac Enzymes: No results for input(s): CKTOTAL, CKMB, CKMBINDEX, TROPONINI in the last 168 hours. BNP (last 3 results) No results for input(s): PROBNP in the last 8760 hours. HbA1C: No results for input(s): HGBA1C in the last 72 hours. CBG: No results for input(s): GLUCAP in the last 168 hours. Lipid Profile: No results for input(s): CHOL, HDL, LDLCALC, TRIG, CHOLHDL, LDLDIRECT in the last 72 hours. Thyroid Function Tests: No results for input(s): TSH, T4TOTAL, FREET4, T3FREE, THYROIDAB in the last 72 hours. Anemia Panel: No results for  input(s): VITAMINB12, FOLATE, FERRITIN, TIBC, IRON, RETICCTPCT in the last 72 hours. Sepsis Labs: No results for input(s): PROCALCITON, LATICACIDVEN in the last 168 hours.  No results found for this or any previous visit (from the past 240 hours).       Radiology Studies: No results found.       Scheduled Meds:  enoxaparin  (LOVENOX ) injection  40 mg Subcutaneous Q24H   levothyroxine   224 mcg Oral Q0600   pantoprazole  (PROTONIX ) IV  40 mg Intravenous Q24H   Continuous Infusions:     LOS: 5 days       Anthony CHRISTELLA Pouch, MD [facility] Pager [phone]  If 7PM-7AM, please contact night-coverage [email or web address] 05/24/2023, 8:54 AM

## 2023-05-25 DIAGNOSIS — K56609 Unspecified intestinal obstruction, unspecified as to partial versus complete obstruction: Secondary | ICD-10-CM | POA: Diagnosis not present

## 2023-05-25 LAB — CBC
HCT: 31.1 % — ABNORMAL LOW (ref 39.0–52.0)
Hemoglobin: 11 g/dL — ABNORMAL LOW (ref 13.0–17.0)
MCH: 32.6 pg (ref 26.0–34.0)
MCHC: 35.4 g/dL (ref 30.0–36.0)
MCV: 92.3 fL (ref 80.0–100.0)
Platelets: 342 10*3/uL (ref 150–400)
RBC: 3.37 MIL/uL — ABNORMAL LOW (ref 4.22–5.81)
RDW: 12.9 % (ref 11.5–15.5)
WBC: 6.1 10*3/uL (ref 4.0–10.5)
nRBC: 0 % (ref 0.0–0.2)

## 2023-05-25 LAB — BASIC METABOLIC PANEL
Anion gap: 7 (ref 5–15)
BUN: 18 mg/dL (ref 8–23)
CO2: 27 mmol/L (ref 22–32)
Calcium: 8.2 mg/dL — ABNORMAL LOW (ref 8.9–10.3)
Chloride: 99 mmol/L (ref 98–111)
Creatinine, Ser: 0.93 mg/dL (ref 0.61–1.24)
GFR, Estimated: >60 mL/min (ref >60)
Glucose, Bld: 87 mg/dL (ref 70–99)
Potassium: 3.8 mmol/L (ref 3.5–5.1)
Sodium: 133 mmol/L — ABNORMAL LOW (ref 135–145)

## 2023-05-25 MED ORDER — PROCHLORPERAZINE MALEATE 5 MG PO TABS: 5.0000 mg | ORAL_TABLET | Freq: Four times a day (QID) | ORAL | 0 refills | Status: DC | PRN

## 2023-05-25 MED ORDER — SCOPOLAMINE 1 MG/3DAYS TD PT72: 1.0000 | MEDICATED_PATCH | TRANSDERMAL | 0 refills | Status: AC

## 2023-05-25 MED ORDER — HYDROCODONE-ACETAMINOPHEN 5-325 MG PO TABS: 1.0000 | ORAL_TABLET | ORAL | 0 refills | Status: DC | PRN

## 2023-05-25 NOTE — Plan of Care (Signed)

## 2023-05-25 NOTE — Discharge Instructions (Signed)

## 2023-05-25 NOTE — Care Management Important Message (Signed)
 Important Message  Patient Details  Name: Donald Lee MRN: [id number] Date of Birth: 1951/01/05   Important Message Given:  Yes - Medicare IM     Felix Host 05/25/2023, 3:18 PM

## 2023-05-25 NOTE — Progress Notes (Signed)
 Patient ID: Donald Lee, male   DOB: [birth year], 73 y.o.   MRN: [id number]     SURGICAL PROGRESS NOTE   Hospital Day(s): 6.   Interval History: Patient seen and examined, no acute events or new complaints overnight. Patient reports feeling great.  He endorses a continued passing gas.  He tolerated soft diet without any abdominal pain.  No nausea or vomiting.  Still no bowel movement but feeling very comfortable.  Vital signs in last 24 hours: [min-max] current  Temp:  [97.5 F (36.4 C)-98.2 F (36.8 C)] 97.5 F (36.4 C) (02/10 0449) Pulse Rate:  [51-67] 56 (02/10 0742) Resp:  [16-20] 16 (02/10 0742) BP: (129-172)/(75-82) 145/75 (02/10 0742) SpO2:  [96 %-99 %] 99 % (02/10 0742)     Height: 5\' 11"  (180.3 cm) Weight: 83.6 kg BMI (Calculated): 25.72   Physical Exam:  Constitutional: alert, cooperative and no distress  Respiratory: breathing non-labored at rest  Cardiovascular: regular rate and sinus rhythm  Gastrointestinal: soft, non-tender, and non-distended  Labs:     Latest Ref Rng & Units 05/25/2023    5:37 AM 05/24/2023    5:09 AM 05/23/2023    5:25 AM  CBC  WBC 4.0 - 10.5 K/uL 6.1  6.4  6.8   Hemoglobin 13.0 - 17.0 g/dL 47.8  29.5  62.1   Hematocrit 39.0 - 52.0 % 31.1  34.5  32.1   Platelets 150 - 400 K/uL 342  387  304       Latest Ref Rng & Units 05/25/2023    5:37 AM 05/24/2023    5:09 AM 05/23/2023    5:25 AM  CMP  Glucose 70 - 99 mg/dL 87  89  80   BUN 8 - 23 mg/dL 18  18  17    Creatinine 0.61 - 1.24 mg/dL 3.08  6.57  8.46   Sodium 135 - 145 mmol/L 133  133  133   Potassium 3.5 - 5.1 mmol/L 3.8  4.2  3.8   Chloride 98 - 111 mmol/L 99  97  99   CO2 22 - 32 mmol/L 27  29  26    Calcium 8.9 - 10.3 mg/dL 8.2  8.3  7.9     Imaging studies: No new pertinent imaging studies   Assessment/Plan:  74 y.o. male with small bowel obstruction s/p small bowel resection postop day #5, complicated by pertinent comorbidities including: Hypertension, chronic gastritis, chronic  diarrhea.    -Continue clinically doing well.  No fever, stable vital signs -Continue passing gas with abdominal physical exam with soft abdomen -Tolerated soft diet.  Still not having bowel movement but he is very comfortable with passing gas no nausea. -Final pathology shows acute over chronic ulcer as a cause of stricture and obstruction.  Avoid NSAIDs. -From surgical standpoint patient can be discharged once medically stable.  Lucila Rye, MD

## 2023-05-25 NOTE — Plan of Care (Signed)

## 2023-05-25 NOTE — Discharge Summary (Signed)
 Physician Discharge Summary  Donald Lee ZOX:[id number] DOB: April 14, 1951 DOA: 05/19/2023  PCP: Efraim Grange, NP  Admit date: 05/19/2023 Discharge date: 05/25/2023  Admitted From: home  Disposition:  home   Recommendations for Outpatient Follow-up:  Follow up with PCP in 1-2 weeks F/u w/ gen surg, Dr. Dortha Gauss, in 2 weeks   Home Health: no  Equipment/Devices:  Discharge Condition: stable  CODE STATUS: full  Diet recommendation: Regular  Brief/Interim Summary: HPI was taken from Dr. Mariella Shore:  Donald Lee is a 73 y.o. male with medical history significant of hypertension, hyperlipidemia, hypothyroidism, OSA on CPAP who was otherwise well until 2 days ago when he started experiencing nausea and vomiting.  According to patient 2 days ago he vomited 5 times and says that now he has not been able to eat any meal.  He followed up with his physician yesterday and CT scan of the abdomen was done that showed small bowel obstruction and patient was called to come to the emergency room for further management.  Patient has absolute constipation with associated abdominal distention.  Denies fever chest pain or urinary complaints Upon arrival to the emergency room, vitals 97.7 respiratory rate 18 pulse 58 blood pressure 151/72.  ED physician discussed with surgery who recommended [facility] admission and they will see in consultation.  Discharge Diagnoses:  Principal Problem:   Small bowel obstruction (HCC)  SBO: CT scan of the abdomen showing findings of high-grade small bowel obstruction. S/p small bowel resection 05/20/23 as per gen surg. Pathology is neg for malignancy but showed area of recurrent ulcers. No further inpatient or outpatient GI work-up needed as per GI. No more NSAIDs. Norco, morphine  prn for pain. Diet advanced to soft diet. Nausea is much improved Zofran  & compazine  prn. Continue on scopolamine  patch. Has gas but no BM yet. Gen surg following and recs apprec   HTN: not on any  anti-HTN meds at home. IV hydralazine prn   Hyponatremia: resolved    Hypothyroidism: continue on levothyroxine     HLD: continue on statin    OSA: CPAP qhs  Discharge Instructions  Discharge Instructions     Diet general   Complete by: As directed    Discharge instructions   Complete by: As directed    F/u w/ PCP in 1-2 weeks. F/u w/ gen surg. Dr. Dortha Gauss, in 2 weeks   Increase activity slowly   Complete by: As directed       Allergies as of 05/25/2023   No Known Allergies      Medication List     STOP taking these medications    oxyCODONE -acetaminophen  5-325 MG tablet Commonly known as: Percocet       TAKE these medications    alfuzosin 10 MG 24 hr tablet Commonly known as: UROXATRAL Take 10 mg by mouth daily.   finasteride 5 MG tablet Commonly known as: PROSCAR Take 1 tablet by mouth daily.   HYDROcodone -acetaminophen  5-325 MG tablet Commonly known as: NORCO/VICODIN Take 1 tablet by mouth every 4 (four) hours as needed for moderate pain (pain score 4-6).   levothyroxine  112 MCG tablet Commonly known as: SYNTHROID  Take 224 mcg by mouth daily before breakfast.   pantoprazole  40 MG tablet Commonly known as: PROTONIX  Take 40 mg by mouth daily.   prochlorperazine  5 MG tablet Commonly known as: COMPAZINE  Take 1 tablet (5 mg total) by mouth every 6 (six) hours as needed for up to 14 days for nausea or vomiting.   scopolamine  1 MG/3DAYS  Commonly known as: TRANSDERM-SCOP Place 1 patch (1.5 mg total) onto the skin every 3 (three) days. Start taking on: May 27, 2023   sertraline 100 MG tablet Commonly known as: ZOLOFT Take 100 mg by mouth daily.   sildenafil 100 MG tablet Commonly known as: VIAGRA Take 100 mg by mouth as needed for erectile dysfunction.   solifenacin 10 MG tablet Commonly known as: VESICARE Take 10 mg by mouth daily.   sucralfate 1 g tablet Commonly known as: CARAFATE Take 1 g by mouth 4 (four) times daily -  with  meals and at bedtime.   Ventolin HFA 108 (90 Base) MCG/ACT inhaler Generic drug: albuterol Inhale 2 puffs into the lungs every 6 (six) hours as needed for wheezing or shortness of breath.   Vitamin D3 50 MCG (2000 UT) capsule Take 2,000 Units by mouth daily.        Follow-up Information     Eldred Grego, MD Follow up in 2 week(s).   Specialty: General Surgery Why: Follow up after small bowel resection Contact information: [address] [place] [address] [phone]                No Known Allergies  Consultations: Gen surg    Procedures/Studies: DG Abd 2 Views Result Date: 05/21/2023 CLINICAL DATA:  Small bowel obstruction EXAM: ABDOMEN - 2 VIEW COMPARISON:  05/20/2023 FINDINGS: NG tube remains in the stomach. Air and contrast material noted throughout the colon. Continued small bowel dilatation in the mid abdomen measuring approximately 5 cm in diameter compared 8 cm previously. No free air or organomegaly. IMPRESSION: Continued small bowel dilatation, slightly decreased in diameter since prior study. This likely reflects improving but persistent small bowel obstruction. NG tube in the stomach. Electronically Signed   By: Janeece Mechanic M.D.   On: 05/21/2023 11:53   DG Abd 2 Views Result Date: 05/20/2023 CLINICAL DATA:  Small bowel obstruction. Episode of diarrhea last night. Right-sided abdominal pain EXAM: ABDOMEN - 2 VIEW COMPARISON:  05/20/2023 at 2:40 a.m. FINDINGS: Nasogastric tube observed with tip in the stomach body and side port in the distal esophagus. The lung bases appear clear. Contrast medium is present in the colon and rectum. There are substantially dilated loops of small bowel measuring up to 8 cm in diameter, previously these measured up to 6 cm in diameter. Scattered air-fluid levels are present in some of the air fluid levels seem to likely be at differing vertical levels in the same loop, favoring small bowel obstruction. IMPRESSION:  1. Substantially dilated loops of small bowel measuring up to 8 cm in diameter, previously 6 cm in diameter. Scattered air-fluid levels are present in some of the air fluid levels seem to likely be at differing vertical levels in the same loop, favoring small bowel obstruction. 2. Contrast medium is present in the colon and rectum. 3. Nasogastric tube with tip in the stomach body and side port in the distal esophagus. Electronically Signed   By: Freida Jes M.D.   On: 05/20/2023 10:57   DG Abd Portable 1V-Small Bowel Obstruction Protocol-initial, 8 hr delay Result Date: 05/20/2023 CLINICAL DATA:  Small-bowel obstruction EXAM: PORTABLE ABDOMEN - 1 VIEW COMPARISON:  05/19/2023 FINDINGS: Gas distended, dilated small bowel within the central abdomen. Dilute contrast material within the colon. The side port of the esophageal catheter is at the gastroesophageal junction and should be advanced by 7 cm. IMPRESSION: 1. Gas distended, dilated small bowel within the central abdomen, compatible with small bowel  obstruction. 2. Side port of the esophageal catheter is at the gastroesophageal junction and should be advanced by 7 cm. Electronically Signed   By: Juanetta Nordmann M.D.   On: 05/20/2023 03:48   DG Abd Portable 1V-Small Bowel Protocol-Position Verification Result Date: 05/19/2023 CLINICAL DATA:  Encounter for imaging study to confirm nasogastric tube placement. EXAM: PORTABLE ABDOMEN - 1 VIEW COMPARISON:  CT abdomen and pelvis 05/18/2023 FINDINGS: New nasogastric tube tip overlies the stomach in left upper quadrant. The side port overlies the gastroesophageal junction. Persistent air-filled dilated loops of small bowel visualized within the upper abdomen consistent with the small-bowel obstruction seen on yesterday's CT. Moderate multilevel degenerative disc changes of the thoracic spine with mild dextrocurvature of the mid to lower thoracic spine. IMPRESSION: 1. New nasogastric tube tip overlies the stomach in  left upper quadrant. The side port overlies the gastroesophageal junction. 2. Persistent air-filled dilated loops of small bowel within the upper abdomen consistent with the small-bowel obstruction seen on yesterday's CT. Electronically Signed   By: Bertina Broccoli M.D.   On: 05/19/2023 16:59   CT ABDOMEN PELVIS W CONTRAST Result Date: 05/18/2023 CLINICAL DATA:  Epigastric abdominal pain. Bilious vomiting. Diarrhea. Peptic ulcer disease. EXAM: CT ABDOMEN AND PELVIS WITH CONTRAST TECHNIQUE: Multidetector CT imaging of the abdomen and pelvis was performed using the standard protocol following bolus administration of intravenous contrast. RADIATION DOSE REDUCTION: This exam was performed according to the departmental dose-optimization program which includes automated exposure control, adjustment of the mA and/or kV according to patient size and/or use of iterative reconstruction technique. CONTRAST:  OMNIPAQUE  IOHEXOL  300 MG/ML  SOLN COMPARISON:  04/08/2023 FINDINGS: Lower Chest: No acute findings. Hepatobiliary: Mild diffuse hepatic steatosis, increased since previous study. Stable benign-appearing hepatic cysts. No suspicious hepatic masses identified. Gallbladder is unremarkable. No evidence of biliary ductal dilatation. Pancreas:  No mass or inflammatory changes. Spleen: Within normal limits in size and appearance. Adrenals/Urinary Tract: No suspicious masses identified. No evidence of ureteral calculi or hydronephrosis. Stomach/Bowel: Markedly dilated small bowel is seen, with transition point in the anterior right abdomen just deep to the anterior abdominal wall muscles, most likely due to adhesion. No mass or inflammatory process identified. Diverticulosis is seen mainly involving the descending and sigmoid colon, however there is no evidence of diverticulitis. Vascular/Lymphatic: No pathologically enlarged lymph nodes. No acute vascular findings. Reproductive:  No mass or other significant abnormality.  Other: Postop changes again noted from previous left inguinal hernia repair. Musculoskeletal:  No suspicious bone lesions identified. IMPRESSION: High-grade distal small bowel obstruction, with transition point in anterior right abdomen just deep to the anterior abdominal wall muscles, most likely due to adhesion. Colonic diverticulosis, without radiographic evidence of diverticulitis. Mild hepatic steatosis, increased since previous study. Electronically Signed   By: Marlyce Sine M.D.   On: 05/18/2023 18:13   (Echo, Carotid, EGD, Colonoscopy, ERCP)    Subjective: pt c/o fatigue    Discharge Exam: Vitals:   05/25/23 0449 05/25/23 0742  BP: (!) 172/76 (!) 145/75  Pulse: (!) 51 (!) 56  Resp: 20 16  Temp: (!) 97.5 F (36.4 C)   SpO2: 97% 99%   Vitals:   05/24/23 1556 05/24/23 2007 05/25/23 0449 05/25/23 0742  BP: 129/80 (!) 158/82 (!) 172/76 (!) 145/75  Pulse: 67 (!) 55 (!) 51 (!) 56  Resp: 18 16 20 16   Temp: 98.2 F (36.8 C) 97.6 F (36.4 C) (!) 97.5 F (36.4 C)   TempSrc: Oral Oral Oral   SpO2:  96% 96% 97% 99%  Weight:      Height:        General: Pt is alert, awake, not in acute distress Cardiovascular:  S1/S2 +, no rubs, no gallops Respiratory: CTA bilaterally, no wheezing, no rhonchi Abdominal: Soft, NT, ND, bowel sounds + Extremities: no edema, no cyanosis    The results of significant diagnostics from this hospitalization (including imaging, microbiology, ancillary and laboratory) are listed below for reference.     Microbiology: No results found for this or any previous visit (from the past 240 hours).   Labs: BNP (last 3 results) No results for input(s): "BNP" in the last 8760 hours. Basic Metabolic Panel: Recent Labs  Lab 05/21/23 0451 05/22/23 0607 05/23/23 0525 05/24/23 0509 05/25/23 0537  NA 136 135 133* 133* 133*  K 4.0 3.9 3.8 4.2 3.8  CL 105 100 99 97* 99  CO2 27 26 26 29 27   GLUCOSE 104* 86 80 89 87  BUN 25* 21 17 18 18   CREATININE 0.87  0.86 0.77 0.95 0.93  CALCIUM 7.5* 7.8* 7.9* 8.3* 8.2*   Liver Function Tests: Recent Labs  Lab 05/19/23 1124  AST 21  ALT 19  ALKPHOS 115  BILITOT 0.6  PROT 4.9*  ALBUMIN 2.8*   No results for input(s): "LIPASE", "AMYLASE" in the last 168 hours. No results for input(s): "AMMONIA" in the last 168 hours. CBC: Recent Labs  Lab 05/19/23 1124 05/20/23 0556 05/21/23 0451 05/22/23 0607 05/23/23 0525 05/24/23 0509 05/25/23 0537  WBC 6.1   < > 8.4 8.3 6.8 6.4 6.1  NEUTROABS 4.4  --   --   --   --   --   --   HGB 12.2*   < > 10.9* 10.9* 11.2* 11.9* 11.0*  HCT 35.1*   < > 31.1* 32.0* 32.1* 34.5* 31.1*  MCV 94.9   < > 93.7 95.0 94.1 93.2 92.3  PLT 281   < > 308 325 304 387 342   < > = values in this interval not displayed.   Cardiac Enzymes: No results for input(s): "CKTOTAL", "CKMB", "CKMBINDEX", "TROPONINI" in the last 168 hours. BNP: Invalid input(s): "POCBNP" CBG: No results for input(s): "GLUCAP" in the last 168 hours. D-Dimer No results for input(s): "DDIMER" in the last 72 hours. Hgb A1c No results for input(s): "HGBA1C" in the last 72 hours. Lipid Profile No results for input(s): "CHOL", "HDL", "LDLCALC", "TRIG", "CHOLHDL", "LDLDIRECT" in the last 72 hours. Thyroid function studies No results for input(s): "TSH", "T4TOTAL", "T3FREE", "THYROIDAB" in the last 72 hours.  Invalid input(s): "FREET3" Anemia work up No results for input(s): "VITAMINB12", "FOLATE", "FERRITIN", "TIBC", "IRON", "RETICCTPCT" in the last 72 hours. Urinalysis    Component Value Date/Time   COLORURINE YELLOW 06/21/2020 1142   APPEARANCEUR CLEAR 06/21/2020 1142   LABSPEC 1.010 06/21/2020 1142   PHURINE 5.5 06/21/2020 1142   GLUCOSEU NEGATIVE 06/21/2020 1142   HGBUR NEGATIVE 06/21/2020 1142   BILIRUBINUR NEGATIVE 06/21/2020 1142   KETONESUR NEGATIVE 06/21/2020 1142   PROTEINUR NEGATIVE 06/21/2020 1142   NITRITE NEGATIVE 06/21/2020 1142   LEUKOCYTESUR NEGATIVE 06/21/2020 1142   Sepsis  Labs Recent Labs  Lab 05/22/23 0607 05/23/23 0525 05/24/23 0509 05/25/23 0537  WBC 8.3 6.8 6.4 6.1   Microbiology No results found for this or any previous visit (from the past 240 hours).   Time coordinating discharge: Over 30 minutes  SIGNED:   Alphonsus Jeans, MD  [facility] 05/25/2023, 1:42 PM Pager   If 7PM-7AM,  please contact night-coverage [email or web address]

## 2023-07-28 ENCOUNTER — Other Ambulatory Visit: Payer: Self-pay | Admitting: Orthopedic Surgery

## 2023-07-28 ENCOUNTER — Ambulatory Visit
Admission: RE | Admit: 2023-07-28 | Discharge: 2023-07-28 | Disposition: A | Discharge status: Home / Self Care | Source: Ambulatory Visit | Attending: Orthopedic Surgery | Admitting: Orthopedic Surgery

## 2023-07-28 DIAGNOSIS — M25511 Pain in right shoulder: Secondary | ICD-10-CM | POA: Diagnosis present

## 2023-08-06 ENCOUNTER — Other Ambulatory Visit: Payer: Self-pay | Admitting: Orthopedic Surgery

## 2023-08-06 ENCOUNTER — Ambulatory Visit
Admission: RE | Admit: 2023-08-06 | Discharge: 2023-08-06 | Disposition: A | Discharge status: Home / Self Care | Source: Ambulatory Visit | Attending: Orthopedic Surgery | Admitting: Orthopedic Surgery

## 2023-08-06 DIAGNOSIS — S46001A Unspecified injury of muscle(s) and tendon(s) of the rotator cuff of right shoulder, initial encounter: Secondary | ICD-10-CM | POA: Diagnosis present

## 2023-08-07 ENCOUNTER — Other Ambulatory Visit: Payer: Self-pay | Admitting: Orthopedic Surgery

## 2023-08-07 DIAGNOSIS — S46001A Unspecified injury of muscle(s) and tendon(s) of the rotator cuff of right shoulder, initial encounter: Secondary | ICD-10-CM

## 2023-08-10 ENCOUNTER — Other Ambulatory Visit: Payer: Self-pay | Admitting: Orthopedic Surgery

## 2023-08-14 ENCOUNTER — Encounter
Admission: RE | Admit: 2023-08-14 | Discharge: 2023-08-14 | Disposition: A | Discharge status: Home / Self Care | Source: Ambulatory Visit | Attending: Orthopedic Surgery | Admitting: Orthopedic Surgery

## 2023-08-14 ENCOUNTER — Other Ambulatory Visit: Payer: Self-pay

## 2023-08-14 VITALS — BP 156/73 | HR 63 | Resp 20 | Ht 71.0 in | Wt 205.0 lb

## 2023-08-14 DIAGNOSIS — Z01818 Encounter for other preprocedural examination: Secondary | ICD-10-CM | POA: Diagnosis present

## 2023-08-14 HISTORY — DX: Gastrojejunal ulcer, unspecified as acute or chronic, without hemorrhage or perforation: K28.9

## 2023-08-14 LAB — TYPE AND SCREEN
ABO/RH(D): A POS
Antibody Screen: NEGATIVE

## 2023-08-14 LAB — CBC WITH DIFFERENTIAL/PLATELET
Abs Immature Granulocytes: 0.01 10*3/uL (ref 0.00–0.07)
Basophils Absolute: 0.1 10*3/uL (ref 0.0–0.1)
Basophils Relative: 1 %
Eosinophils Absolute: 0.2 10*3/uL (ref 0.0–0.5)
Eosinophils Relative: 3 %
HCT: 35.2 % — ABNORMAL LOW (ref 39.0–52.0)
Hemoglobin: 12.1 g/dL — ABNORMAL LOW (ref 13.0–17.0)
Immature Granulocytes: 0 %
Lymphocytes Relative: 28 %
Lymphs Abs: 1.5 10*3/uL (ref 0.7–4.0)
MCH: 33.1 pg (ref 26.0–34.0)
MCHC: 34.4 g/dL (ref 30.0–36.0)
MCV: 96.2 fL (ref 80.0–100.0)
Monocytes Absolute: 0.4 10*3/uL (ref 0.1–1.0)
Monocytes Relative: 8 %
Neutro Abs: 3.1 10*3/uL (ref 1.7–7.7)
Neutrophils Relative %: 60 %
Platelets: 213 10*3/uL (ref 150–400)
RBC: 3.66 MIL/uL — ABNORMAL LOW (ref 4.22–5.81)
RDW: 12.3 % (ref 11.5–15.5)
WBC: 5.2 10*3/uL (ref 4.0–10.5)
nRBC: 0 % (ref 0.0–0.2)

## 2023-08-14 LAB — COMPREHENSIVE METABOLIC PANEL WITH GFR
ALT: 17 U/L (ref 0–44)
AST: 16 U/L (ref 15–41)
Albumin: 4 g/dL (ref 3.5–5.0)
Alkaline Phosphatase: 42 U/L (ref 38–126)
Anion gap: 8 (ref 5–15)
BUN: 32 mg/dL — ABNORMAL HIGH (ref 8–23)
CO2: 25 mmol/L (ref 22–32)
Calcium: 8.9 mg/dL (ref 8.9–10.3)
Chloride: 102 mmol/L (ref 98–111)
Creatinine, Ser: 1.03 mg/dL (ref 0.61–1.24)
GFR, Estimated: >60 mL/min (ref >60)
Glucose, Bld: 96 mg/dL (ref 70–99)
Potassium: 3.7 mmol/L (ref 3.5–5.1)
Sodium: 135 mmol/L (ref 135–145)
Total Bilirubin: 0.6 mg/dL (ref 0.0–1.2)
Total Protein: 6.4 g/dL — ABNORMAL LOW (ref 6.5–8.1)

## 2023-08-14 LAB — URINALYSIS, ROUTINE W REFLEX MICROSCOPIC
Bilirubin Urine: NEGATIVE
Glucose, UA: NEGATIVE mg/dL
Hgb urine dipstick: NEGATIVE
Ketones, ur: NEGATIVE mg/dL
Leukocytes,Ua: NEGATIVE
Nitrite: NEGATIVE
Protein, ur: NEGATIVE mg/dL
Specific Gravity, Urine: 1.017 (ref 1.005–1.030)
pH: 5 (ref 5.0–8.0)

## 2023-08-14 LAB — SURGICAL PCR SCREEN
MRSA, PCR: NEGATIVE
Staphylococcus aureus: POSITIVE — AB

## 2023-08-14 NOTE — Patient Instructions (Addendum)
 Your procedure is scheduled on: Tuesday 08/25/23 To find out your arrival time, please call [phone] between 1PM - 3PM on:  Monday 08/24/23  Report to the Registration Desk on the 1st floor of the [facility]. FREE Valet parking is available.  If your arrival time is 6:00 am, do not arrive before that time as the [facility] entrance doors do not open until 6:00 am.  REMEMBER: Instructions that are not followed completely may result in serious medical risk, up to and including death; or upon the discretion of your surgeon and anesthesiologist your surgery may need to be rescheduled.  Do not eat food after midnight the night before surgery.  No gum chewing or hard candies.  You may however, drink CLEAR liquids up to 2 hours before you are scheduled to arrive for your surgery. Do not drink anything within 2 hours of your scheduled arrival time.  Clear liquids include: - water  - apple juice without pulp - gatorade (not RED colors) - black coffee or tea (Do NOT add milk or creamers to the coffee or tea) Do NOT drink anything that is not on this list.  In addition, your doctor has ordered for you to drink the provided:  Ensure Pre-Surgery Clear Carbohydrate Drink  Drinking this carbohydrate drink up to two hours before surgery helps to reduce insulin resistance and improve patient outcomes. Please complete drinking 2 hours before scheduled arrival time.  One week prior to surgery: Stop Anti-inflammatories (NSAIDS) such as Advil, Aleve, Ibuprofen, Motrin, Naproxen, Naprosyn and Aspirin  based products such as Excedrin, Goody's Powder, BC Powder. You may however, continue to take Tylenol  if needed for pain up until the day of surgery.  Stop ANY OVER THE COUNTER supplements 7 days until after surgery.  Continue taking all prescribed medications with the exception of the following: Zepbound last dose was Friday 08/08/23  sildenafil (VIAGRA) hold for 2 days before your procedure  TAKE  ONLY THESE MEDICATIONS THE MORNING OF SURGERY WITH A SIP OF WATER:  finasteride (PROSCAR) 5 MG tablet  levothyroxine  (SYNTHROID ) 112 MCG tablet  pantoprazole  (PROTONIX ) 40 MG tablet Antacid (take one the night before and one on the morning of surgery - helps to prevent nausea after surgery.) sertraline (ZOLOFT) 100 MG tablet  solifenacin (VESICARE) 10 MG tablet    No Alcohol for 24 hours before or after surgery.  No Smoking including e-cigarettes for 24 hours before surgery.  No chewable tobacco products for at least 6 hours before surgery.  No nicotine patches on the day of surgery.  Do not use any "recreational" drugs for at least a week (preferably 2 weeks) before your surgery.  Please be advised that the combination of cocaine and anesthesia may have negative outcomes, up to and including death. If you test positive for cocaine, your surgery will be cancelled.  On the morning of surgery brush your teeth with toothpaste and water, you may rinse your mouth with mouthwash if you wish. Do not swallow any toothpaste or mouthwash.  Use CHG Soap or wipes as directed on instruction sheet.  Do not wear lotions, powders, or perfumes on the days of surgery  Do not shave body hair from the neck down 48 hours before surgery.  Wear comfortable clothing (specific to your surgery type) to the hospital.  Do not wear jewelry, make-up, hairpins, clips or nail polish.  For welded (permanent) jewelry: bracelets, anklets, waist bands, etc.  Please have this removed prior to surgery.  If it is  not removed, there is a chance that hospital personnel will need to cut it off on the day of surgery. Contact lenses, hearing aids and dentures may not be worn into surgery.   in case you may have to spend the night.   Do not bring valuables to the hospital. [facility] is not responsible for any missing/lost belongings or valuables.   Total Shoulder Arthroplasty:  use Benzoyl Peroxide 5% Gel as directed on  instruction sheet.  Notify your doctor if there is any change in your medical condition (cold, fever, infection).  If you are being discharged the day of surgery, you will not be allowed to drive home. You will need a responsible individual to drive you home and stay with you for 24 hours after surgery.   If you are taking public transportation, you will need to have a responsible individual with you.  After surgery, you can help prevent lung complications by doing breathing exercises.  Take deep breaths and cough every 1-2 hours. Your doctor may order a device called an Incentive Spirometer to help you take deep breaths.  Surgery Visitation Policy:  Patients undergoing a surgery or procedure may have two family members or support persons with them as long as the person is not COVID-19 positive or experiencing its symptoms.   Please call the Pre-admissions Testing Dept. at [phone] if you have any questions about these instructions.    Pre-operative 5 CHG Bath Instructions   You can play a key role in reducing the risk of infection after surgery. Your skin needs to be as free of germs as possible. You can reduce the number of germs on your skin by washing with CHG (chlorhexidine  gluconate) soap before surgery. CHG is an antiseptic soap that kills germs and continues to kill germs even after washing.   DO NOT use if you have an allergy to chlorhexidine /CHG or antibacterial soaps. If your skin becomes reddened or irritated, stop using the CHG and notify one of our RNs at [phone].   Please shower with the CHG soap starting 4 days before surgery using the following schedule:   Friday 08/21/23 - Tuesday 08/25/23    Please keep in mind the following:  DO NOT shave, including legs and underarms, starting the day of your first shower.   You may shave your face at any point before/day of surgery.  Place clean sheets on your bed the day you start using CHG soap. Use a clean  washcloth (not used since being washed) for each shower. DO NOT sleep with pets once you start using the CHG.   CHG Shower Instructions:  If you choose to wash your hair and private area, wash first with your normal shampoo/soap.  After you use shampoo/soap, rinse your hair and body thoroughly to remove shampoo/soap residue.  Turn the water OFF and apply about 3 tablespoons (45 ml) of CHG soap to a CLEAN washcloth.  Apply CHG soap ONLY FROM YOUR NECK DOWN TO YOUR TOES (washing for 3-5 minutes)  DO NOT use CHG soap on face, private areas, open wounds, or sores.  Pay special attention to the area where your surgery is being performed.  If you are having back surgery, having someone wash your back for you may be helpful. Wait 2 minutes after CHG soap is applied, then you may rinse off the CHG soap.  Pat dry with a clean towel  Put on clean clothes/pajamas   If you choose to wear lotion, please use ONLY  the CHG-compatible lotions on the back of this paper.     Additional instructions for the day of surgery: DO NOT APPLY any lotions, deodorants, cologne, or perfumes.   Put on clean/comfortable clothes.  Brush your teeth.  Ask your nurse before applying any prescription medications to the skin.      CHG Compatible Lotions   Aveeno Moisturizing lotion  Cetaphil Moisturizing Cream  Cetaphil Moisturizing Lotion  Clairol Herbal Essence Moisturizing Lotion, Dry Skin  Clairol Herbal Essence Moisturizing Lotion, Extra Dry Skin  Clairol Herbal Essence Moisturizing Lotion, Normal Skin  Curel Age Defying Therapeutic Moisturizing Lotion with Alpha Hydroxy  Curel Extreme Care Body Lotion  Curel Soothing Hands Moisturizing Hand Lotion  Curel Therapeutic Moisturizing Cream, Fragrance-Free  Curel Therapeutic Moisturizing Lotion, Fragrance-Free  Curel Therapeutic Moisturizing Lotion, Original Formula  Eucerin Daily Replenishing Lotion  Eucerin Dry Skin Therapy Plus Alpha Hydroxy Crme  Eucerin  Dry Skin Therapy Plus Alpha Hydroxy Lotion  Eucerin Original Crme  Eucerin Original Lotion  Eucerin Plus Crme Eucerin Plus Lotion  Eucerin TriLipid Replenishing Lotion  Keri Anti-Bacterial Hand Lotion  Keri Deep Conditioning Original Lotion Dry Skin Formula Softly Scented  Keri Deep Conditioning Original Lotion, Fragrance Free Sensitive Skin Formula  Keri Lotion Fast Absorbing Fragrance Free Sensitive Skin Formula  Keri Lotion Fast Absorbing Softly Scented Dry Skin Formula  Keri Original Lotion  Keri Skin Renewal Lotion Keri Silky Smooth Lotion  Keri Silky Smooth Sensitive Skin Lotion  Nivea Body Creamy Conditioning Oil  Nivea Body Extra Enriched Lotion  Nivea Body Original Lotion  Nivea Body Sheer Moisturizing Lotion Nivea Crme  Nivea Skin Firming Lotion  NutraDerm 30 Skin Lotion  NutraDerm Skin Lotion  NutraDerm Therapeutic Skin Cream  NutraDerm Therapeutic Skin Lotion  ProShield Protective Hand Cream  Provon moisturizing lotion  Preparing for Total Shoulder Arthroplasty  Before surgery, you can play an important role by reducing the number of germs on your skin by using the following products:  Benzoyl Peroxide Gel  o Reduces the number of germs present on the skin  o Applied twice a day to shoulder area starting two days before surgery  Chlorhexidine  Gluconate (CHG) Soap  o An antiseptic cleaner that kills germs and bonds with the skin to continue killing germs even after washing  o Used for showering the night before surgery and morning of surgery  BENZOYL PEROXIDE 5% GEL  Please do not use if you have an allergy to benzoyl peroxide. If your skin becomes reddened/irritated stop using the benzoyl peroxide.  Starting two days before surgery, apply as follows:  1. Apply benzoyl peroxide in the morning and at night. Apply after taking a shower. If you are not taking a shower, clean entire shoulder front, back, and side along with the armpit with a clean wet  washcloth.  2. Place a quarter-sized dollop on your shoulder and rub in thoroughly, making sure to cover the front, back, and side of your shoulder, along with the armpit.  2 days before ____ AM ____ PM (Sunday 08/23/23) 1 day before ____ AM ____ PM (Monday 08/24/23)  3. Do this twice a day for two days. (Last application is the night before surgery, AFTER using the CHG soap).  4. Do NOT apply benzoyl peroxide gel on the day of surgery.    How to Use an Incentive Spirometer  An incentive spirometer is a tool that measures how well you are filling your lungs with each breath. Learning to take long, deep breaths using this  tool can help you keep your lungs clear and active. This may help to reverse or lessen your chance of developing breathing (pulmonary) problems, especially infection. You may be asked to use a spirometer: After a surgery. If you have a lung problem or a history of smoking. After a long period of time when you have been unable to move or be active. If the spirometer includes an indicator to show the highest number that you have reached, your health care provider or respiratory therapist will help you set a goal. Keep a log of your progress as told by your health care provider. What are the risks? Breathing too quickly may cause dizziness or cause you to pass out. Take your time so you do not get dizzy or light-headed. If you are in pain, you may need to take pain medicine before doing incentive spirometry. It is harder to take a deep breath if you are having pain.  How to use your incentive spirometer  Sit up on the edge of your bed or on a chair. Hold the incentive spirometer so that it is in an upright position. Before you use the spirometer, breathe out normally. Place the mouthpiece in your mouth. Make sure your lips are closed tightly around it. Breathe in slowly and as deeply as you can through your mouth, causing the piston or the ball to rise toward the top of the  chamber. Hold your breath for 3-5 seconds, or for as long as possible. If the spirometer includes a coach indicator, use this to guide you in breathing. Slow down your breathing if the indicator goes above the marked areas. Remove the mouthpiece from your mouth and breathe out normally. The piston or ball will return to the bottom of the chamber. Rest for a few seconds, then repeat the steps 10 or more times. Take your time and take a few normal breaths between deep breaths so that you do not get dizzy or light-headed. Do this every 1-2 hours when you are awake. If the spirometer includes a goal marker to show the highest number you have reached (best effort), use this as a goal to work toward during each repetition. After each set of 10 deep breaths, cough a few times. This will help to make sure that your lungs are clear. If you have an incision on your chest or abdomen from surgery, place a pillow or a rolled-up towel firmly against the incision when you cough. This can help to reduce pain while taking deep breaths and coughing. General tips When you are able to get out of bed: Walk around often. Continue to take deep breaths and cough in order to clear your lungs. Keep using the incentive spirometer until your health care provider says it is okay to stop using it. If you have been in the hospital, you may be told to keep using the spirometer at home. Contact a health care provider if: You are having difficulty using the spirometer. You have trouble using the spirometer as often as instructed. Your pain medicine is not giving enough relief for you to use the spirometer as told. You have a fever. Get help right away if: You develop shortness of breath. You develop a cough with bloody mucus from the lungs. You have fluid or blood coming from an incision site after you cough. Summary An incentive spirometer is a tool that can help you learn to take long, deep breaths to keep your lungs clear  and active. You may  be asked to use a spirometer after a surgery, if you have a lung problem or a history of smoking, or if you have been inactive for a long period of time. Use your incentive spirometer as instructed every 1-2 hours while you are awake. If you have an incision on your chest or abdomen, place a pillow or a rolled-up towel firmly against your incision when you cough. This will help to reduce pain. Get help right away if you have shortness of breath, you cough up bloody mucus, or blood comes from your incision when you cough. This information is not intended to replace advice given to you by your health care provider. Make sure you discuss any questions you have with your health care provider. Document Revised: 06/20/2019 Document Reviewed: 06/20/2019 Elsevier Patient Education  2023 [employer].

## 2023-08-25 ENCOUNTER — Ambulatory Visit
Admission: RE | Admit: 2023-08-25 | Discharge: 2023-08-25 | Disposition: A | Discharge status: Home / Self Care | Source: Ambulatory Visit | Attending: Orthopedic Surgery | Admitting: Orthopedic Surgery

## 2023-08-25 ENCOUNTER — Ambulatory Visit

## 2023-08-25 ENCOUNTER — Ambulatory Visit: Payer: Self-pay | Admitting: Urgent Care

## 2023-08-25 ENCOUNTER — Ambulatory Visit: Admitting: Registered Nurse

## 2023-08-25 ENCOUNTER — Other Ambulatory Visit: Payer: Self-pay

## 2023-08-25 ENCOUNTER — Encounter
Admission: RE | Disposition: A | Discharge status: Home / Self Care | Payer: Self-pay | Source: Ambulatory Visit | Attending: Orthopedic Surgery

## 2023-08-25 DIAGNOSIS — M75101 Unspecified rotator cuff tear or rupture of right shoulder, not specified as traumatic: Secondary | ICD-10-CM | POA: Insufficient documentation

## 2023-08-25 DIAGNOSIS — Z79899 Other long term (current) drug therapy: Secondary | ICD-10-CM | POA: Insufficient documentation

## 2023-08-25 DIAGNOSIS — I10 Essential (primary) hypertension: Secondary | ICD-10-CM | POA: Insufficient documentation

## 2023-08-25 DIAGNOSIS — M19011 Primary osteoarthritis, right shoulder: Secondary | ICD-10-CM | POA: Diagnosis not present

## 2023-08-25 DIAGNOSIS — F17210 Nicotine dependence, cigarettes, uncomplicated: Secondary | ICD-10-CM | POA: Insufficient documentation

## 2023-08-25 DIAGNOSIS — G4733 Obstructive sleep apnea (adult) (pediatric): Secondary | ICD-10-CM | POA: Diagnosis not present

## 2023-08-25 DIAGNOSIS — S46001A Unspecified injury of muscle(s) and tendon(s) of the rotator cuff of right shoulder, initial encounter: Secondary | ICD-10-CM | POA: Diagnosis present

## 2023-08-25 HISTORY — PX: REVERSE SHOULDER ARTHROPLASTY: SHX5054

## 2023-08-25 LAB — ABO/RH: ABO/RH(D): A POS

## 2023-08-25 SURGERY — ARTHROPLASTY, SHOULDER, TOTAL, REVERSE
Anesthesia: General | Site: Shoulder | Laterality: Right

## 2023-08-25 MED ORDER — ROCURONIUM BROMIDE 100 MG/10ML IV SOLN
INTRAVENOUS | Status: DC | PRN
  Administered 2023-08-25: 50 mg via INTRAVENOUS
  Administered 2023-08-25: 40 mg via INTRAVENOUS

## 2023-08-25 MED ORDER — FENTANYL CITRATE (PF) 100 MCG/2ML IJ SOLN: 25.0000 ug | INTRAMUSCULAR | Status: DC | PRN

## 2023-08-25 MED ORDER — SODIUM CHLORIDE 0.9 % IR SOLN
Status: DC | PRN
  Administered 2023-08-25: 3000 mL

## 2023-08-25 MED ORDER — FENTANYL CITRATE PF 50 MCG/ML IJ SOSY
PREFILLED_SYRINGE | INTRAMUSCULAR | Status: AC
  Filled 2023-08-25: qty 1

## 2023-08-25 MED ORDER — PROPOFOL 10 MG/ML IV BOLUS
INTRAVENOUS | Status: DC | PRN
  Administered 2023-08-25: 150 mg via INTRAVENOUS

## 2023-08-25 MED ORDER — BUPIVACAINE LIPOSOME 1.3 % IJ SUSP
INTRAMUSCULAR | Status: AC
  Filled 2023-08-25: qty 20

## 2023-08-25 MED ORDER — CHLORHEXIDINE GLUCONATE 0.12 % MT SOLN
OROMUCOSAL | Status: AC
  Filled 2023-08-25: qty 15

## 2023-08-25 MED ORDER — MIDAZOLAM HCL 2 MG/2ML IJ SOLN
1.0000 mg | INTRAMUSCULAR | Status: DC | PRN
  Administered 2023-08-25: 1 mg via INTRAVENOUS

## 2023-08-25 MED ORDER — TRANEXAMIC ACID-NACL 1000-0.7 MG/100ML-% IV SOLN
1000.0000 mg | INTRAVENOUS | Status: AC
  Administered 2023-08-25: 1000 mg via INTRAVENOUS

## 2023-08-25 MED ORDER — SUCCINYLCHOLINE CHLORIDE 200 MG/10ML IV SOSY
PREFILLED_SYRINGE | INTRAVENOUS | Status: AC
  Filled 2023-08-25: qty 10

## 2023-08-25 MED ORDER — CEFAZOLIN SODIUM-DEXTROSE 2-4 GM/100ML-% IV SOLN
INTRAVENOUS | Status: AC
  Filled 2023-08-25: qty 100

## 2023-08-25 MED ORDER — CHLORHEXIDINE GLUCONATE 0.12 % MT SOLN: 15.0000 mL | Freq: Once | OROMUCOSAL | Status: DC

## 2023-08-25 MED ORDER — DEXMEDETOMIDINE HCL IN NACL 80 MCG/20ML IV SOLN
INTRAVENOUS | Status: DC | PRN
Start: 2023-08-25 — End: 2023-08-25
  Administered 2023-08-25: 8 ug via INTRAVENOUS
  Administered 2023-08-25: 4 ug via INTRAVENOUS
  Administered 2023-08-25: 8 ug via INTRAVENOUS

## 2023-08-25 MED ORDER — SODIUM CHLORIDE 0.9 % IV BOLUS: 500.0000 mL | Freq: Once | INTRAVENOUS | Status: DC

## 2023-08-25 MED ORDER — OXYCODONE HCL 5 MG/5ML PO SOLN: 5.0000 mg | Freq: Once | ORAL | Status: DC | PRN

## 2023-08-25 MED ORDER — 0.9 % SODIUM CHLORIDE (POUR BTL) OPTIME
TOPICAL | Status: DC | PRN
  Administered 2023-08-25: 500 mL

## 2023-08-25 MED ORDER — CEFAZOLIN SODIUM-DEXTROSE 2-4 GM/100ML-% IV SOLN
2.0000 g | INTRAVENOUS | Status: AC
  Administered 2023-08-25: 2 g via INTRAVENOUS

## 2023-08-25 MED ORDER — MIDAZOLAM HCL 2 MG/2ML IJ SOLN
INTRAMUSCULAR | Status: AC
  Filled 2023-08-25: qty 2

## 2023-08-25 MED ORDER — OXYCODONE HCL 5 MG PO TABS: 5.0000 mg | ORAL_TABLET | ORAL | 0 refills | Status: DC | PRN

## 2023-08-25 MED ORDER — ACETAMINOPHEN 10 MG/ML IV SOLN
INTRAVENOUS | Status: AC
  Filled 2023-08-25: qty 100

## 2023-08-25 MED ORDER — LIDOCAINE HCL (CARDIAC) PF 100 MG/5ML IV SOSY
PREFILLED_SYRINGE | INTRAVENOUS | Status: DC | PRN
  Administered 2023-08-25: 60 mg via INTRAVENOUS

## 2023-08-25 MED ORDER — VANCOMYCIN HCL 1000 MG IV SOLR
INTRAVENOUS | Status: AC
  Filled 2023-08-25: qty 20

## 2023-08-25 MED ORDER — STERILE WATER FOR IRRIGATION IR SOLN
Status: DC | PRN
  Administered 2023-08-25: 1000 mL

## 2023-08-25 MED ORDER — VANCOMYCIN HCL 1000 MG IV SOLR
INTRAVENOUS | Status: DC | PRN
  Administered 2023-08-25: 1000 mg via TOPICAL

## 2023-08-25 MED ORDER — FENTANYL CITRATE (PF) 100 MCG/2ML IJ SOLN
INTRAMUSCULAR | Status: AC
Start: 2023-08-25 — End: ?
  Filled 2023-08-25: qty 2

## 2023-08-25 MED ORDER — FENTANYL CITRATE PF 50 MCG/ML IJ SOSY
50.0000 ug | PREFILLED_SYRINGE | Freq: Once | INTRAMUSCULAR | Status: AC
  Administered 2023-08-25: 50 ug via INTRAVENOUS

## 2023-08-25 MED ORDER — ACETAMINOPHEN 10 MG/ML IV SOLN
INTRAVENOUS | Status: DC | PRN
  Administered 2023-08-25: 1000 mg via INTRAVENOUS

## 2023-08-25 MED ORDER — ACETAMINOPHEN 500 MG PO TABS: 1000.0000 mg | ORAL_TABLET | Freq: Three times a day (TID) | ORAL | 2 refills | Status: AC

## 2023-08-25 MED ORDER — DEXAMETHASONE SODIUM PHOSPHATE 10 MG/ML IJ SOLN
INTRAMUSCULAR | Status: AC
  Filled 2023-08-25: qty 1

## 2023-08-25 MED ORDER — PHENYLEPHRINE HCL-NACL 20-0.9 MG/250ML-% IV SOLN
INTRAVENOUS | Status: DC | PRN
Start: 2023-08-25 — End: 2023-08-25
  Administered 2023-08-25: 20 ug/min via INTRAVENOUS
  Administered 2023-08-25: 10 ug/min via INTRAVENOUS

## 2023-08-25 MED ORDER — SUGAMMADEX SODIUM 200 MG/2ML IV SOLN
INTRAVENOUS | Status: DC | PRN
  Administered 2023-08-25 (×2): 200 mg via INTRAVENOUS

## 2023-08-25 MED ORDER — ONDANSETRON 4 MG PO TBDP: 4.0000 mg | ORAL_TABLET | Freq: Three times a day (TID) | ORAL | 0 refills | Status: AC | PRN

## 2023-08-25 MED ORDER — TRANEXAMIC ACID-NACL 1000-0.7 MG/100ML-% IV SOLN
INTRAVENOUS | Status: AC
  Filled 2023-08-25: qty 100

## 2023-08-25 MED ORDER — LIDOCAINE HCL (PF) 1 % IJ SOLN
INTRAMUSCULAR | Status: AC
  Filled 2023-08-25: qty 5

## 2023-08-25 MED ORDER — FENTANYL CITRATE (PF) 100 MCG/2ML IJ SOLN
INTRAMUSCULAR | Status: DC | PRN
Start: 2023-08-25 — End: 2023-08-25
  Administered 2023-08-25 (×2): 50 ug via INTRAVENOUS

## 2023-08-25 MED ORDER — GLYCOPYRROLATE 0.2 MG/ML IJ SOLN
INTRAMUSCULAR | Status: DC | PRN
  Administered 2023-08-25: .2 mg via INTRAVENOUS

## 2023-08-25 MED ORDER — LIDOCAINE HCL (PF) 1 % IJ SOLN
INTRAMUSCULAR | Status: DC | PRN
  Administered 2023-08-25: 3 mL

## 2023-08-25 MED ORDER — CEFAZOLIN SODIUM-DEXTROSE 2-4 GM/100ML-% IV SOLN
2.0000 g | Freq: Once | INTRAVENOUS | Status: AC
  Administered 2023-08-25: 2 g via INTRAVENOUS

## 2023-08-25 MED ORDER — LACTATED RINGERS IV SOLN: INTRAVENOUS | Status: DC

## 2023-08-25 MED ORDER — ORAL CARE MOUTH RINSE: 15.0000 mL | Freq: Once | OROMUCOSAL | Status: DC

## 2023-08-25 MED ORDER — SUCCINYLCHOLINE CHLORIDE 200 MG/10ML IV SOSY
PREFILLED_SYRINGE | INTRAVENOUS | Status: DC | PRN
  Administered 2023-08-25: 100 mg via INTRAVENOUS

## 2023-08-25 MED ORDER — ROCURONIUM BROMIDE 10 MG/ML (PF) SYRINGE
PREFILLED_SYRINGE | INTRAVENOUS | Status: AC
  Filled 2023-08-25: qty 10

## 2023-08-25 MED ORDER — ASPIRIN 325 MG PO TBEC
325.0000 mg | DELAYED_RELEASE_TABLET | Freq: Every day | ORAL | 0 refills | Status: AC
Start: 2023-08-26 — End: 2023-10-07

## 2023-08-25 MED ORDER — DEXAMETHASONE SODIUM PHOSPHATE 10 MG/ML IJ SOLN
INTRAMUSCULAR | Status: DC | PRN
  Administered 2023-08-25: 5 mg via INTRAVENOUS

## 2023-08-25 MED ORDER — EPHEDRINE SULFATE-NACL 50-0.9 MG/10ML-% IV SOSY
PREFILLED_SYRINGE | INTRAVENOUS | Status: DC | PRN
  Administered 2023-08-25: 10 mg via INTRAVENOUS
  Administered 2023-08-25: 5 mg via INTRAVENOUS

## 2023-08-25 MED ORDER — PROPOFOL 10 MG/ML IV BOLUS
INTRAVENOUS | Status: AC
  Filled 2023-08-25: qty 20

## 2023-08-25 MED ORDER — OXYCODONE HCL 5 MG PO TABS: 5.0000 mg | ORAL_TABLET | Freq: Once | ORAL | Status: DC | PRN

## 2023-08-25 MED ORDER — ONDANSETRON HCL 4 MG/2ML IJ SOLN
INTRAMUSCULAR | Status: AC
  Filled 2023-08-25: qty 2

## 2023-08-25 MED ORDER — BUPIVACAINE LIPOSOME 1.3 % IJ SUSP
INTRAMUSCULAR | Status: DC | PRN
  Administered 2023-08-25: 20 mL

## 2023-08-25 MED ORDER — ONDANSETRON HCL 4 MG/2ML IJ SOLN
INTRAMUSCULAR | Status: DC | PRN
  Administered 2023-08-25: 4 mg via INTRAVENOUS

## 2023-08-25 SURGICAL SUPPLY — 69 items
BASEPLATE P2 COATD GLND 6.5X30 (Shoulder) IMPLANT
BLADE SAGITTAL WIDE XTHICK NO (BLADE) ×1 IMPLANT
CHLORAPREP W/TINT 26 (MISCELLANEOUS) ×1 IMPLANT
COOLER POLAR GLACIER W/PUMP (MISCELLANEOUS) ×1 IMPLANT
DERMABOND ADVANCED .7 DNX12 (GAUZE/BANDAGES/DRESSINGS) IMPLANT
DRAPE INCISE IOBAN 66X45 STRL (DRAPES) ×1 IMPLANT
DRAPE SHEET LG 3/4 BI-LAMINATE (DRAPES) ×1 IMPLANT
DRAPE TABLE BACK 80X90 (DRAPES) ×1 IMPLANT
DRILL GLEN ALTIVATE 3.5 (DRILL) IMPLANT
DRSG OPSITE POSTOP 3X4 (GAUZE/BANDAGES/DRESSINGS) IMPLANT
DRSG OPSITE POSTOP 4X8 (GAUZE/BANDAGES/DRESSINGS) IMPLANT
DRSG TEGADERM 2-3/8X2-3/4 SM (GAUZE/BANDAGES/DRESSINGS) IMPLANT
ELECTRODE REM PT RTRN 9FT ADLT (ELECTROSURGICAL) ×1 IMPLANT
EVACUATOR 1/8 PVC DRAIN (DRAIN) ×1 IMPLANT
GAUZE SPONGE 2X2 STRL 8-PLY (GAUZE/BANDAGES/DRESSINGS) ×1 IMPLANT
GAUZE XEROFORM 1X8 LF (GAUZE/BANDAGES/DRESSINGS) IMPLANT
GLOVE BIOGEL PI IND STRL 8 (GLOVE) ×2 IMPLANT
GLOVE PI ULTRA LF STRL 7.5 (GLOVE) ×2 IMPLANT
GLOVE SURG ORTHO 8.0 STRL STRW (GLOVE) ×2 IMPLANT
GLOVE SURG SYN 8.0 (GLOVE) ×3 IMPLANT
GLOVE SURG SYN 8.0 PF PI (GLOVE) ×1 IMPLANT
GOWN SRG LRG LVL 4 IMPRV REINF (GOWNS) ×2 IMPLANT
GOWN SRG XL LVL 3 NONREINFORCE (GOWNS) ×1 IMPLANT
GUIDE BONE MODEL RSA DJO (ORTHOPEDIC DISPOSABLE SUPPLIES) IMPLANT
GUIDE WIRE ALTIVATE 2.4X228 SL (WIRE) IMPLANT
HEAD GLENOID W/SCREW 32MM (Shoulder) IMPLANT
HOOD PEEL AWAY T7 (MISCELLANEOUS) ×3 IMPLANT
INSERT EPOLYSTD HUMERUS 36MM (Shoulder) IMPLANT
KIT STABILIZATION SHOULDER (MISCELLANEOUS) ×1 IMPLANT
MANIFOLD NEPTUNE II (INSTRUMENTS) ×1 IMPLANT
MASK FACE SPIDER DISP (MASK) ×1 IMPLANT
MAT ABSORB FLUID 56X50 GRAY (MISCELLANEOUS) ×1 IMPLANT
NDL REVERSE CUT 1/2 CRC (NEEDLE) IMPLANT
NDL SPNL 20GX3.5 QUINCKE YW (NEEDLE) IMPLANT
NEEDLE REVERSE CUT 1/2 CRC (NEEDLE) ×1 IMPLANT
NEEDLE SPNL 20GX3.5 QUINCKE YW (NEEDLE) ×1 IMPLANT
NS IRRIG 500ML POUR BTL (IV SOLUTION) ×1 IMPLANT
PACK ARTHROSCOPY SHOULDER (MISCELLANEOUS) ×1 IMPLANT
PAD WRAPON POLAR SHDR XLG (MISCELLANEOUS) ×1 IMPLANT
PENCIL SMOKE EVACUATOR (MISCELLANEOUS) ×1 IMPLANT
SCREW BONE LOCKING RSP 5.0X30 (Screw) ×1 IMPLANT
SCREW BONE LOCKING RSP 5.0X34 (Screw) ×1 IMPLANT
SCREW BONE RSP LOCK 5X18 (Screw) IMPLANT
SCREW BONE RSP LOCK 5X26 (Screw) IMPLANT
SCREW BONE RSP LOCK 5X30 (Screw) IMPLANT
SCREW BONE RSP LOCK 5X34 (Screw) IMPLANT
SCREW BONE RSP LOCKING 18MM LG (Screw) ×1 IMPLANT
SCREW BONE RSP LOCKING 5.0X26 (Screw) ×1 IMPLANT
SLING ULTRA II M (MISCELLANEOUS) IMPLANT
SOL .9 NS 3000ML IRR UROMATIC (IV SOLUTION) ×1 IMPLANT
SPONGE T-LAP 18X18 ~~LOC~~+RFID (SPONGE) ×1 IMPLANT
STAPLER SKIN PROX 35W (STAPLE) IMPLANT
STEM HUMERAL STD SHELL 14X48 (Miscellaneous) IMPLANT
STRAP SAFETY 5IN WIDE (MISCELLANEOUS) ×1 IMPLANT
SUT MNCRL AB 4-0 PS2 18 (SUTURE) IMPLANT
SUT PROLENE 6 0 P 1 18 (SUTURE) IMPLANT
SUT TICRON 2-0 30IN 311381 (SUTURE) ×2 IMPLANT
SUT VIC AB 0 CT1 36 (SUTURE) ×1 IMPLANT
SUT VIC AB 2-0 CT2 27 (SUTURE) ×2 IMPLANT
SUT XBRAID 1.4 BLK/WHT (SUTURE) IMPLANT
SUT XBRAID 1.4 BLUE (SUTURE) IMPLANT
SUT XBRAID 1.4 WHITE/BLUE (SUTURE) IMPLANT
SUT XBRAID 2 BLACK/BLUE (SUTURE) IMPLANT
SUTURE ETHBND 5-0 MS/4 CCS GRN (SUTURE) ×1 IMPLANT
SUTURE FIBERWR #2 38 BLUE 1/2 (SUTURE) ×4 IMPLANT
TAP CANN GLEN 6.5 (TAP) IMPLANT
TIP FAN IRRIG PULSAVAC PLUS (DISPOSABLE) ×1 IMPLANT
TRAP FLUID SMOKE EVACUATOR (MISCELLANEOUS) ×1 IMPLANT
WATER STERILE IRR 1000ML POUR (IV SOLUTION) ×1 IMPLANT

## 2023-08-25 NOTE — Anesthesia Procedure Notes (Signed)
 Procedure Name: Intubation Date/Time: 08/25/2023 11:52 AM  Performed by: Marisue Side, CRNAPre-anesthesia Checklist: Patient identified, Emergency Drugs available, Suction available and Patient being monitored Patient Re-evaluated:Patient Re-evaluated prior to induction Oxygen Delivery Method: Circle system utilized Preoxygenation: Pre-oxygenation with 100% oxygen Induction Type: IV induction Ventilation: Mask ventilation without difficulty Laryngoscope Size: McGrath and 3 Grade View: Grade I Tube type: Oral Tube size: 7.5 mm Number of attempts: 1 Airway Equipment and Method: Stylet and Oral airway Placement Confirmation: ETT inserted through vocal cords under direct vision, positive ETCO2 and breath sounds checked- equal and bilateral Tube secured with: Tape Dental Injury: Teeth and Oropharynx as per pre-operative assessment

## 2023-08-25 NOTE — Anesthesia Preprocedure Evaluation (Signed)
 Anesthesia Evaluation  Patient identified by MRN, date of birth, ID band Patient awake    Reviewed: Allergy & Precautions, NPO status , Patient's Chart, lab work & pertinent test results  History of Anesthesia Complications Negative for: history of anesthetic complications  Airway Mallampati: II  TM Distance: >3 FB Neck ROM: full    Dental no notable dental hx.    Pulmonary sleep apnea and Continuous Positive Airway Pressure Ventilation , Current Smoker and Patient abstained from smoking.   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 block with occasional Premature ventricular complexes   Neuro/Psych  PSYCHIATRIC DISORDERS  Depression     Neuromuscular disease    GI/Hepatic Neg liver ROS, PUD,,,  Endo/Other  Hypothyroidism    Renal/GU      Musculoskeletal   Abdominal   Peds  Hematology negative hematology ROS (+)   Anesthesia Other Findings Past Medical History: No date: Benign localized hyperplasia of prostate with urinary  obstruction No date: Depressive disorder No date: Dyslipidemia No date: Hyperlipidemia No date: Hypertension No date: Hypogammaglobulinemia (HCC) No date: Hypothyroidism No date: OSA on CPAP No date: Primary osteoarthritis of left foot No date: Spinal stenosis of lumbar region with neurogenic claudication No date: Ulcer of the stomach and intestine  Past Surgical History: No date: APPENDECTOMY No date: BACK SURGERY 05/12/2023: BIOPSY     Comment:  Procedure: BIOPSY;  Surgeon: Corky Diener, Alphonsus Jeans, MD;                Location: [facility] ENDOSCOPY;  Service: Gastroenterology;; 05/20/2023: BOWEL RESECTION; N/A     Comment:  Procedure: SMALL BOWEL RESECTION;  Surgeon:               Eldred Grego, MD;  Location: [facility] ORS;  Service:              General;  Laterality:  N/A; 05/12/2023: COLONOSCOPY WITH PROPOFOL ; N/A     Comment:  Procedure: COLONOSCOPY WITH PROPOFOL ;  Surgeon: Toledo,               Alphonsus Jeans, MD;  Location: [facility] ENDOSCOPY;  Service:               Gastroenterology;  Laterality: N/A; 05/12/2023: ESOPHAGOGASTRODUODENOSCOPY (EGD) WITH PROPOFOL ; N/A     Comment:  Procedure: ESOPHAGOGASTRODUODENOSCOPY (EGD) WITH               PROPOFOL ;  Surgeon: Toledo, Alphonsus Jeans, MD;  Location:               [facility] ENDOSCOPY;  Service: Gastroenterology;  Laterality:               N/A; No date: FOOT SURGERY; Bilateral     Comment:  bone spurs No date: HERNIA REPAIR; Right     Comment:  groin 08/13/2021: SHOULDER ARTHROSCOPY WITH SUBACROMIAL DECOMPRESSION AND  OPEN ROTATOR C; Left     Comment:  Procedure: Left shoulder arthroscopic biceps tenodesis               with subacromial decompression and mini open rotator cuff              repair;  Surgeon: Lorri Rota, MD;  Location: [facility]               [facility];  Service: Orthopedics;  Laterality: Left; No date: THYROIDECTOMY, PARTIAL No date: VASECTOMY 05/20/2023: XI ROBOT  ASSISTED DIAGNOSTIC LAPAROSCOPY; N/A     Comment:  Procedure: XI ROBOT ASSISTED DIAGNOSTIC LAPAROSCOPY;                Surgeon: Eldred Grego, MD;  Location: [facility] ORS;               Service: General;  Laterality: N/A;  BMI    Body Mass Index: 28.59 kg/m      Reproductive/Obstetrics negative OB ROS                              Anesthesia Physical Anesthesia Plan  ASA: 3  Anesthesia Plan: General ETT   Post-op Pain Management: Toradol  IV (intra-op)*, Ofirmev  IV (intra-op)* and Regional block*   Induction: Intravenous  PONV Risk Score and Plan: 2 and Ondansetron , Dexamethasone  and Treatment may vary due to age or medical condition  Airway Management Planned: Oral ETT  Additional Equipment:   Intra-op Plan:   Post-operative Plan: Extubation in OR  Informed Consent: I have reviewed the  patients History and Physical, chart, labs and discussed the procedure including the risks, benefits and alternatives for the proposed anesthesia with the patient or authorized representative who has indicated his/her understanding and acceptance.     Dental Advisory Given  Plan Discussed with: Anesthesiologist, CRNA and Surgeon  Anesthesia Plan Comments: (Patient consented for risks of anesthesia including but not limited to:  - adverse reactions to medications - damage to eyes, teeth, lips or other oral mucosa - nerve damage due to positioning  - sore throat or hoarseness - Damage to heart, brain, nerves, lungs, other parts of body or loss of life  Patient voiced understanding and assent.)         Anesthesia Quick Evaluation

## 2023-08-25 NOTE — H&P (Signed)
 Paper H&P to be scanned into permanent record. H&P reviewed. No significant changes noted.

## 2023-08-25 NOTE — Op Note (Signed)
 SURGERY DATE: 08/25/2023   PRE-OP DIAGNOSIS:  1. Right shoulder massive rotator cuff tear 2. Right shoulder glenohumeral degenerative changes   POST-OP DIAGNOSIS:  1. Right shoulder massive rotator cuff tear 2. Right shoulder glenohumeral degenerative changes   PROCEDURES:  1. Right reverse total shoulder arthroplasty 2. Right biceps tenodesis   SURGEON: Cleotilde Dago, MD  ASSISTANTS: Bert Britain, PA   ANESTHESIA: Gen + interscalene block   ESTIMATED BLOOD LOSS: 200cc   TOTAL IV FLUIDS: per anesthesia record  IMPLANTS: [employer]: RSP Glenoid Head w/Retaining screw 36N; Monoblock Reverse Shoulder Baseplate with 6.66mm central screw; 4 locking screws into baseplate; Standard Shell Short Humeral Stem 14 x 48mm; Neutral Small Socket Insert;    INDICATION(S):  Donald Lee is a 73 y.o. male with acute on chronic shoulder pain. He suffered a glenohumeral dislocation and likely further tearing of his chronic rotator cuff tear. He had inability to lift arm overhead. Imaging consistent with massive, irreparable rotator cuff tear with degenerative changes to the glenohumeral joint. Conservative measures including medications and cortisone injections have not provided adequate relief. After discussion of risks, benefits, and alternatives to surgery, the patient elected to proceed with reverse shoulder arthroplasty and biceps tenodesis.   OPERATIVE FINDINGS: massive rotator cuff tear (complete supraspinatus, partial infraspinatus, partial subscapularis) with glenohumeral degenerative changes   OPERATIVE REPORT:   I identified [name] in the pre-operative holding area. Informed consent was obtained and the surgical site was marked. I reviewed the risks and benefits of the proposed surgical intervention and the patient wished to proceed. An interscalene block with Exparel  was administered by the Anesthesia team. The patient was transferred to the operative suite and general  anesthesia was administered. The patient was placed in the beach chair position with the head of the bed elevated approximately 45 degrees. All down side pressure points were appropriately padded. Pre-op exam under anesthesia confirmed some stiffness and crepitus. Appropriate IV antibiotics were administered. The extremity was then prepped and draped in standard fashion. A time out was performed confirming the correct extremity, correct patient, and correct procedure.   We used the standard deltopectoral incision from the coracoid to ~12cm distal. We found the cephalic vein and took it laterally. We opened the deltopectoral interval widely and placed retractors under the CA ligament in the subacromial space and under the deltoid tendon at its insertion. We then abducted and internally rotated the arm and released the underlying bursa between these retractors, taking care not to damage the circumflex branch of the axillary nerve.   Next, we brought the arm back in adduction at slight forward flexion with external rotation. We opened the clavipectoral fascia lateral to the conjoint tendon. We gently palpated the axillary nerve and verified its position and continuity on both sides of the humerus with a Tug test. This test was repeated multiple times during the procedure for nerve localization and confirmed to be intact at the end of the case. We then cauterized the anterior humeral circumflex ("Three sisters") vessels. The arm was then internally rotated, we cut the falciform ligament at approximately 1 cm of the upper portion of the pectoralis major insertion. Next we unroofed the bicipital groove. We proceeded with a soft tissue biceps tenodesis given the pathology (significant thickening and tendinopathy proximally) of the tendon.  After opening the biceps tendon sheath all the way to the supraglenoid tubercle, we performed a biceps tenodesis with two #2 TiCron sutures to the upper border of the pectoralis  major. The proximal portion of the tendon was excised.   At this point, we could see that the supraspinatus was completely torn with a bald humeral head superiorly. The subscapularis also had a partial tear of the superior fibers. We performed a subscapularis peel using electrocautery to remove the anterior capsule and subscapularis off of the humeral head. We released the inferior capsule from the humerus all the way to the posterior band of the inferior glenohumeral ligament. When this was complete, we gently dislocated the shoulder up into the wound. We removed any osteophytes and made our cut with the appropriate inclination in 30 degrees of retroversion  We then turned our attention back to the glenoid. The proximal humerus was retracted posteriorly. The anterior capsule was dissected free from the subscapularis. The anterior capsule was then excised, exposing the anterior glenoid. We then grasped the labrum and removed it circumferentially. During the glenoid exposure, the axillary nerve was protected the entire time.    A patient-specific guide was used to drill the central guidepin. An appropriately sized reamer was used to ream the glenoid. A cannulated tap was placed over the guidepin. The monoblock baseplate was inserted and excellent fixation was achieved such that the entire scapula rotated with further attempted seating of the baseplate. The peripheral screws were drilled, measured, and placed. The wound was thoroughly irrigated. The glenosphere was then placed and tightened.   We then turned our attention back to the humerus. We sized for a standard shell prosthesis. We sequentially used larger diameter canal finders until we met appropriate resistance and sequential broaching was performed to this size listed above. Trial poly inserts were placed. The humerus was trialed and noted to have satisfactory stability, motion, and deltoid tension with above listed poly. The trial implants were  removed. 3 drill holes were placed about the lesser tuberosity footprint and FiberWire sutures were passed through these holes for subscapularis repair. Next, the implant was placed with the appropriate retroversion. Stability was confirmed. We placed the actual poly insert. The humerus was reduced and motion, tension, and stability were satisfactory. A Hemovac drain was placed. The wound was thoroughly irrigated. Subscapularis was repaired with the previously passed #2 FiberWire sutures after passing them through the subscapularis.   We again verified the tension on the axillary nerve, appropriate range of motion, stability of the implant, and security of the subscapularis repair. We closed the deltopectoral interval deep to the cephalic vein with a running, 0-Vicryl suture. The skin was closed with 2-0 Vicryl and 4-0 Monocryl. Xeroform and Honeycomb dressing was applied. A PolarCare unit and sling were placed. Patient was extubated, transferred to a stretcher bed and to the post anesthesia care unit in stable condition.   Of note, assistance from a PA was essential to performing the surgery.  PA was present for the entire surgery.  PA assisted with patient positioning, retraction, instrumentation, and wound closure. The surgery would have been more difficult and had longer operative time without PA assistance.    POSTOPERATIVE PLAN: The patient will be discharged home from PACU. Operative arm to remain in sling at all times except RoM exercises and hygiene. Can perform pendulums, elbow/wrist/hand RoM exercises. Passive RoM allowed to 90 FF and 30 ER. ASA 325mg  x 6 weeks for DVT ppx. Plan for PT starting on POD #3-7. Patient to return to clinic in ~2 weeks for post-operative appointment.

## 2023-08-25 NOTE — Transfer of Care (Signed)
 Immediate Anesthesia Transfer of Care Note  Patient: Donald Lee  Procedure(s) Performed: ARTHROPLASTY, SHOULDER, TOTAL, REVERSE (Right: Shoulder)  Patient Location: PACU  Anesthesia Type:General and Regional  Level of Consciousness: drowsy and patient cooperative  Airway & Oxygen Therapy: Patient Spontanous Breathing and Patient connected to nasal cannula oxygen  Post-op Assessment: Report given to RN and Post -op Vital signs reviewed and stable  Post vital signs: Reviewed and stable  Last Vitals:  Vitals Value Taken Time  BP 142/67 08/25/23 1445  Temp 36.8 C 08/25/23 1445  Pulse 73 08/25/23 1445  Resp 18 08/25/23 1445  SpO2 97 % 08/25/23 1445  Vitals shown include unfiled device data.  Last Pain:  Vitals:   08/25/23 1445  TempSrc: Tympanic  PainSc: 0-No pain         Complications: No notable events documented.

## 2023-08-25 NOTE — Discharge Instructions (Addendum)
 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 Activity/Sling: You are to be non-weight bearing on operative extremity. A sling/shoulder immobilizer has been provided for you. Only remove the sling to perform elbow, wrist, and hand RoM exercises and hygiene/dressing. Active reaching and lifting are not permitted. You will be given further instructions on sling use at your first physical therapy visit and postoperative visit with Dr. Lydia Sams.   2. Dressings: Dressing may be removed at 1st physical therapy visit (~3-4 days after surgery). Afterwards, you may either leave open to air (if no drainage) or cover with dry, sterile dressing. If you have steri-strips on your wound, please do not remove them. They will fall off on their own. You may shower 5 days after surgery. Please pat incision dry. Do not rub or place any shear forces across incision. If there is drainage or any opening of incision after 5 days, please notify our offices immediately.    3. Driving:  Plan on not driving for six weeks. Please note that you are advised NOT to drive while taking narcotic pain medications as you may be impaired and unsafe to drive.   4. Medications:  - You have been provided a prescription for narcotic pain medicine (usually oxycodone ). After surgery, take 1-2 narcotic tablets every 4 hours if needed for severe pain. Please start this as soon as you begin to start having pain (if you received a nerve block, start taking as soon as this wears off).  - A prescription for anti-nausea medication will be provided in case the narcotic medicine causes nausea - take 1 tablet every 6 hours only if nauseated.  - Take enteric coated aspirin  325 mg once daily for 6 weeks to prevent blood clots. Do not take aspirin  if you have an aspirin  sensitivity/allergy or asthma or are on an anticoagulant (blood thinner) already. If so, then  your home anticoagulant will be resume and managed - do not take aspirin . -Take tylenol  1000mg  (2 Extra strength or 3 regular strength tablets) every 8 hours for pain. This will reduce the amount of narcotic medication needed. May stop tylenol  when you are having minimal pain. - Take a stool softener (Colace, Dulcolax or Senakot) if you are using narcotic pain medications to help with constipation that is associated with narcotic use. - DO NOT take ANY nonsteroidal anti-inflammatory pain medications: Advil, Motrin, Ibuprofen, Aleve, Naproxen, or Naprosyn.   If you are taking prescription medication for anxiety, depression, insomnia, muscle spasm, chronic pain, or for attention deficit disorder you are advised that you are at a higher risk of adverse effects with use of narcotics post-op, including narcotic addiction/dependence, depressed breathing, death. If you use non-prescribed substances: alcohol, marijuana, cocaine, heroin, methamphetamines, etc., you are at a higher risk of adverse effects with use of narcotics post-op, including narcotic addiction/dependence, depressed breathing, death. You are advised that taking > 50 morphine  milligram equivalents (MME) of narcotic pain medication per day results in twice the risk of overdose or death. For your prescription provided: oxycodone  5 mg - taking more than 6 tablets per day after the first few days of surgery.   5. Physical Therapy: 1-2 times per week for ~12 weeks. Therapy typically starts on post operative Day 3 or 4. You have been provided an order for physical therapy. The therapist will provide home exercises. Please contact our offices if this appointment has not been scheduled.  6. Work: May do light duty/desk job in approximately 2 weeks when off of narcotics, pain is well-controlled, and swelling has decreased if able to function with one arm in sling. Full work may take 6 weeks if light motions and function of both arms is required.  Lifting jobs may require 12 weeks.   7. Post-Op Appointments: Your first post-op appointment will be with Dr. Lydia Sams in approximately 2 weeks time.    If you find that they have not been scheduled please call the Orthopaedic Appointment front desk at [phone].                               Cleotilde Dago, MD [facility] Phone: [phone] Fax: [phone]   REVERSE SHOULDER ARTHROPLASTY REHAB GUIDELINES   These guidelines should be tailored to individual patients based on their rehab goals, age, precautions, quality of repair, etc.  Progression should be based on patient progress and approval by the referring physician.  PHASE 1 - Day 1 through Week 2  GENERAL GUIDELINES AND PRECAUTIONS Sling wear 24/7 except during grooming and home exercises (3 to 5 times daily) Avoid shoulder extension such that the arm is posterior the frontal plane.  When patients recline, a pillow should be placed behind the upper arm and sling should be on.  They should be advised to always be able to see the elbow Avoid combined IR/ADD/EXT, such as hand behind back to prevent dislocation Avoid combined IR and ADD such as reaching across the chest to prevent dislocation No AROM No submersion in pool/water for 4 weeks No weight bearing through operative arm (as in transfers, walker use, etc.)  GOALS Maintain integrity of joint replacement; protect soft tissue healing Increase PROM for elevation to 120 and ER to 30 (will remain the goal for first 6 weeks) Optimize distal UE circulation and muscle activity (elbow, wrist and hand) Instruct in use of sling for proper fit, polar care device for ice application after HEP, signs/symptoms of infection  EXERCISES Active elbow, wrist and hand Passive forward elevation in scapular plane to 90-120 max motion; ER in scapular plane to 30 Active scapular retraction with arms resting in neutral position  CRITERIA TO PROGRESS TO  PHASE 2 Low pain (less than 3/10) with shoulder PROM Healing of incision without signs of infection Clearance by MD to advance after 2 week MD check up  PHASE 2 - 2 weeks - 6 weeks  GENERAL GUIDELINES AND PRECAUTIONS Sling may be removed while at home; worn in community without abduction pillow May use arm for light activities of daily living (such as feeding, brushing teeth, dressing.) with elbow near  the side of the body  and arm in front of the body- no active lifting of the arm May submerge in water (tub, pool, [place], etc.) after 4 weeks Continue to avoid WBing through the operative arm Continue to avoid combined IR/EXT/ADD (hand behind the back) and IR/ADD  (reaching across chest) for dislocation precautions  GOALS  Achieve passive elevation to 120 and ER to 30  Low (less than 3/10) to no pain  Ability to fire all heads of the deltoid  EXERCISES May discontinue grip, and active elbow and wrist exercises since using the arm in ADL's  with sling removed around the home Continue passive elevation to 120 and ER to 30, both in scapular plane with arm supported on table top Add submaximal isometrics, pain free effort, for all  functional heads of deltoid (anterior, posterior, middle)  Ensure that with posterior deltoid isometric the shoulder does not move into extension and the arm remains anterior the frontal plane At 4 weeks:  begin to place arm in balanced position of 90 deg elevation in supine; when patient able to hold this position with ease, may begin reverse pendulums clockwise and counterclockwise  CRITERIA TO PROGRESS TO PHASE 3 Passive forward elevation in scapular plane to 120; passive ER in scapular plane to 30 Ability to fire isometrically all heads of the deltoid muscle without pain Ability to place and hold the arm in balanced position (90 deg elevation in supine)  PHASE 3 - 6 weeks to 3 months  GENERAL GUIDELINES AND PRECAUTIONS Discontinue use of sling Avoid  forcing end range motion in any direction to prevent dislocation  May advance use of the arm actively in ADL's without being restricted to arm by the side of the body, however, avoid heavy lifting and sports (forever!) May initiate functional IR behind the back gently NO UPPER BODY ERGOMETER   GOALS Optimize PROM for elevation and ER in scapular plane with realistic expectation that max  mobility for elevation is usually around 145-160 passively; ER 40 to 50 passively; functional IR to L1 Recover AROM to approach as close to PROM available as possible; may expect 135-150 deg active elevation; 30 deg active ER; active functional IR to L1 Establish dynamic stability of the shoulder with deltoid and periscapular muscle gradual strengthening  EXERCISES Forward elevation in scapular plane active progression: supine to incline, to vertical; short to long lever arm Balanced position long lever arm AROM Active ER/IR with arm at side Scapular retraction with light band resistance Functional IR with hand slide up back - very gentle and gradual NO UPPER BODY ERGOMETER     CRITERIA TO PROGRESS TO PHASE 4  AROM equals/approaches PROM with good mechanics for elevation   No pain  Higher level demand on shoulder than ADL functions   PHASE 4 12 months and beyond  GENERAL GUIDELINES AND PRECAUTIONS No heavy lifting and no overhead sports No heavy pushing activity Gradually increase strength of deltoid and scapular stabilizers; also the rotator cuff if present with weights not to exceed 5 lbs NO UPPER BODY ERGOMETER   GOALS  Optimize functional use of the operative UE to meet the desired demands  Gradual increase in deltoid, scapular muscle, and rotator cuff strength  Pain free functional activities   EXERCISES Add light hand weights for deltoid up to and not to exceed 3 lbs for anterior and posterior with long arm lift against gravity; elbow bent to 90 deg for abduction in scapular  plane Theraband progression for extension to hip with scapular depression/retraction Theraband progression for serratus anterior punches in supine; avoid wall, incline or prone pressups for serratus anterior End range stretching gently without forceful overpressure in all planes (elevation in scapular plane, ER in scapular plane, functional IR) with stretching done for life as part of a daily routine NO UPPER BODY ERGOMETER     CRITERIA FOR DISCHARGE FROM SKILLED PHYSICAL THERAPY  Pain free AROM for shoulder elevation (expect around 135-150)  Functional strength for all ADL's, work tasks, and hobbies approved by Careers adviser  Independence with home maintenance program   NOTES: 1. With proper exercise, motion, strength, and function continue to improve even after one year. 2. The complication rate after surgery is 5 - 8%. Complications include infection, fracture, heterotopic bone formation, nerve injury, instability, rotator cuff  tear, and tuberosity nonunion. Please look for clinical signs, unusual symptoms, or lack of progress with therapy and report those to Dr. Lydia Sams. Prefer more communication than less.  3. The therapy plan above only serves as a guide. Please be aware of specific individualized patient instructions as written on the prescription or through discussions with the surgeon. 4. Please call Dr. Lydia Sams if you have any specific questions or concerns [phone]  POLAR CARE INFORMATION  [email or web address]  How to use [employer] Polar Care [facility] Therapy System?  YouTube   [email or web address]  OPERATING INSTRUCTIONS  Start the product With dry hands, connect the transformer to the electrical connection located on the top of the cooler. Next, plug the transformer into an appropriate electrical outlet. The unit will automatically start running at this point.  To stop the pump, disconnect electrical power.  Unplug to stop the product when not in use. Unplugging  the Polar Care unit turns it off. Always unplug immediately after use. Never leave it plugged in while unattended. Remove pad.    FIRST ADD WATER TO FILL LINE, THEN ICE---Replace ice when existing ice is almost melted  1 Discuss Treatment with your Licensed Health Care Practitioner and Use Only as Prescribed 2 Apply Insulation Barrier & Cold Therapy Pad 3 Check for Moisture 4 Inspect Skin Regularly  Tips and Trouble Shooting Usage Tips 1. Use cubed or chunked ice for optimal performance. 2. It is recommended to drain the Pad between uses. To drain the pad, hold the Pad upright with the hose pointed toward the ground. Depress the black plunger and allow water to drain out. 3. You may disconnect the Pad from the unit without removing the pad from the affected area by depressing the silver tabs on the hose coupling and gently pulling the hoses apart. The Pad and unit will seal itself and will not leak. Note: Some dripping during release is normal. 4. DO NOT RUN PUMP WITHOUT WATER! The pump in this unit is designed to run with water. Running the unit without water will cause permanent damage to the pump. 5. Unplug unit before removing lid.  TROUBLESHOOTING GUIDE Pump not running, Water not flowing to the pad, Pad is not getting cold 1. Make sure the transformer is plugged into the wall outlet. 2. Confirm that the ice and water are filled to the indicated levels. 3. Make sure there are no kinks in the pad. 4. Gently pull on the blue tube to make sure the tube/pad junction is straight. 5. Remove the pad from the treatment site and ll it while the pad is lying at; then reapply. 6. Confirm that the pad couplings are securely attached to the unit. Listen for the double clicks (Figure 1) to confirm the pad couplings are securely attached.  Leaks    Note: Some condensation on the lines, controller, and pads is unavoidable, especially in warmer climates. 1. If using a [employer] Polar Care Cold Therapy unit  with a detachable Cold Therapy Pad, and a leak exists (other than condensation on the lines) disconnect the pad couplings. Make sure the silver tabs on the couplings are depressed before reconnecting the pad to the pump hose; then confirm both sides of the coupling are properly clicked in. 2. If the coupling continues to leak or a leak is detected in the pad itself, stop using it and call [employer] Customer Care at [phone].  Cleaning After use, empty and dry the unit with a soft cloth. Warm water and mild detergent  may be used occasionally to clean the pump and tubes.  WARNING: The Polar Care Cube can be cold enough to cause serious injury, including full skin necrosis. Follow these Operating Instructions, and carefully read the Product Insert (see pouch on side of unit) and the Cold Therapy Pad Fitting Instructions (provided with each Cold Therapy Pad) prior to use.       Interscalene Nerve Block with Exparel    For your surgery you have received an Interscalene Nerve Block with Exparel . Nerve Blocks affect many types of nerves, including nerves that control movement, pain and normal sensation.  You may experience feelings such as numbness, tingling, heaviness, weakness or the inability to move your arm or the feeling or sensation that your arm has "fallen asleep". A nerve block with Exparel  can last up to 5 days.  Usually the weakness wears off first.  The tingling and heaviness usually wear off next.  Finally you may start to notice pain.  Keep in mind that this may occur in any order.  Once a nerve block starts to wear off it is usually completely gone within 60 minutes. ISNB may cause mild shortness of breath, a hoarse voice, blurry vision, unequal pupils, or drooping of the face on the same side as the nerve block.  These symptoms will usually resolve with the numbness.  Very rarely the procedure itself can cause mild seizures. If needed, your surgeon will give you a prescription for pain  medication.  It will take about 60 minutes for the oral pain medication to become fully effective.  So, it is recommended that you start taking this medication before the nerve block first begins to wear off, or when you first begin to feel discomfort. Take your pain medication only as prescribed.  Pain medication can cause sedation and decrease your breathing if you take more than you need for the level of pain that you have. Nausea is a common side effect of many pain medications.  You may want to eat something before taking your pain medicine to prevent nausea. After an Interscalene nerve block, you cannot feel pain, pressure or extremes in temperature in the effected arm.  Because your arm is numb it is at an increased risk for injury.  To decrease the possibility of injury, please practice the following:  While you are awake change the position of your arm frequently to prevent too much pressure on any one area for prolonged periods of time.  If you have a cast or tight dressing, check the color or your fingers every couple of hours.  Call your surgeon with the appearance of any discoloration (white or blue). If you are given a sling to wear before you go home, please wear it  at all times until the block has completely worn off.  Do not get up at night without your sling. Please contact [facility] or your surgeon if you do not begin to regain sensation after 7 days from the surgery.  Anesthesia may be contacted by calling the Same Day Surgery Department, Mon. through Fri., 6 am to 4 pm at [phone].   If you experience any other problems or concerns, please contact your surgeon's office. If you experience severe or prolonged shortness of breath go to the nearest emergency department.   SHOULDER SLING IMMOBILIZER   VIDEO Slingshot 2 Shoulder Brace Application - YouTube ---https://[email or web address].info/  INSTRUCTIONS While supporting the injured arm, slide the forearm into  the sling. Wrap the adjustable shoulder strap around the  neck and shoulders and attach the strap end to the sling using  the "alligator strap tab."  Adjust the shoulder strap to the required length. Position the shoulder pad behind the neck. To secure the shoulder pad location (optional), pull the shoulder strap away from the shoulder pad, unfold the hook material on the top of the pad, then press the shoulder strap back onto the hook material to secure the pad in place. Attach the closure strap across the open top of the sling. Position the strap so that it holds the arm securely in the sling. Next, attach the thumb strap to the open end of the sling between the thumb and fingers. After sling has been fit, it may be easily removed and reapplied using the quick release buckle on shoulder strap. If a neutral pillow or 15 abduction pillow is included, place the pillow at the waistline. Attach the sling to the pillow, lining up hook material on the pillow with the loop on sling. Adjust the waist strap to fit.  If waist strap is too long, cut it to fit. Use the small piece of double sided hook material (located on top of the pillow) to secure the strap end. Place the double sided hook material on the inside of the cut strap end and secure it to the waist strap.     If no pillow is included, attach the waist strap to the sling and adjust to fit.    Washing Instructions: Straps and sling must be removed and cleaned regularly depending on your activity level and perspiration. Hand wash straps and sling in cold water with mild detergent, rinse, air dry        Interscalene Nerve Block with Exparel    For your surgery you have received an Interscalene Nerve Block with Exparel . Nerve Blocks affect many types of nerves, including nerves that control movement, pain and normal sensation.  You may experience feelings such as numbness, tingling, heaviness, weakness or the inability to move your arm or the feeling  or sensation that your arm has "fallen asleep". A nerve block with Exparel  can last up to 5 days.  Usually the weakness wears off first.  The tingling and heaviness usually wear off next.  Finally you may start to notice pain.  Keep in mind that this may occur in any order.  Once a nerve block starts to wear off it is usually completely gone within 60 minutes. ISNB may cause mild shortness of breath, a hoarse voice, blurry vision, unequal pupils, or drooping of the face on the same side as the nerve block.  These symptoms will usually resolve with the numbness.  Very rarely the procedure itself can cause mild seizures. If needed, your surgeon will give you a prescription for pain medication.  It will take about 60 minutes for the oral pain medication to become fully effective.  So, it is recommended that you start taking this medication before the nerve block first begins to wear off, or when you first begin to feel discomfort. Take your pain medication only as prescribed.  Pain medication can cause sedation and decrease your breathing if you take more than you need for the level of pain that you have. Nausea is a common side effect of many pain medications.  You may want to eat something before taking your pain medicine to prevent nausea. After an Interscalene nerve block, you cannot feel pain, pressure or extremes in temperature in the effected arm.  Because your arm is  numb it is at an increased risk for injury.  To decrease the possibility of injury, please practice the following:  While you are awake change the position of your arm frequently to prevent too much pressure on any one area for prolonged periods of time.  If you have a cast or tight dressing, check the color or your fingers every couple of hours.  Call your surgeon with the appearance of any discoloration (white or blue). If you are given a sling to wear before you go home, please wear it  at all times until the block has completely worn  off.  Do not get up at night without your sling. Please contact [facility] or your surgeon if you do not begin to regain sensation after 7 days from the surgery.  Anesthesia may be contacted by calling the Same Day Surgery Department, Mon. through Fri., 6 am to 4 pm at [phone].   If you experience any other problems or concerns, please contact your surgeon's office. If you experience severe or prolonged shortness of breath go to the nearest emergency department.

## 2023-08-25 NOTE — Evaluation (Signed)
 Physical Therapy Evaluation Patient Details Name: Donald Lee MRN: [id number] DOB: December 04, 1950 Today's Date: 08/25/2023  History of Present Illness  Pt is a 73 y.o. male s/p 08/25/23 R reverse total shoulder arthroplasty and R biceps tenodesis d/t R shoulder massive rotator cuff tear and R shoulder glenohumeral degenerative changes; pt with interscalene brachial plexus block.  PMH includes OSA on CPAP, spinal stenosis of lumbar region with neurogenic claudication, back sx, B foot sx, L shoulder arthroscopic biceps tenodesis with subacromial decompression and mini open rotator cuff repair, htn.  Clinical Impression  Prior to surgery, pt reports being independent with functional mobility; lives with his wife on main level of home with 3 STE R railing.  No c/o pain during session.  Currently pt is SBA with transfer from recliner; CGA with ambulation (no AD use); and CGA navigating 5 steps with R railing use.  Pt reporting fairly consistent 5/10 dizziness during ambulation (BP after walking was 141/73 (MAP 91) in sitting; in standing BP dropped to 126/70 (MAP 86)).  MD and pt's nurse notified of pt's dizziness and BP noted above; plan to give pt bolus.  Educated pt to have assist with all functional mobility for safety d/t current dizziness concerns (pt and pt's wife verbalizing understanding).  Pt would currently benefit from skilled PT to address noted impairments and functional limitations (see below for any additional details).  Upon hospital discharge, pt would benefit from ongoing therapy (pt's wife reports plan for OP PT starting May 19th).     If plan is discharge home, recommend the following: A little help with walking and/or transfers;A little help with bathing/dressing/bathroom;Assistance with cooking/housework;Assist for transportation;Help with stairs or ramp for entrance   Can travel by private vehicle    Yes    Equipment Recommendations None recommended by PT  Recommendations for  Other Services       Functional Status Assessment Patient has had a recent decline in their functional status and demonstrates the ability to make significant improvements in function in a reasonable and predictable amount of time.     Precautions / Restrictions Precautions Precautions: Shoulder;Fall Shoulder Interventions: Shoulder sling/immobilizer;At all times;Off for dressing/bathing/exercises Precaution Booklet Issued: Yes (comment) Recall of Precautions/Restrictions: Intact Precaution/Restrictions Comments: Per MD Op note: "Operative arm to remain in sling at all times except RoM exercises and hygiene. Can perform pendulums, elbow/wrist/hand RoM exercises. Passive RoM allowed to 90 FF and 30 ER. ASA 325mg  x 6 weeks for DVT ppx. Plan for PT starting on POD #3-7". Required Braces or Orthoses: Sling Restrictions Weight Bearing Restrictions Per Provider Order: Yes RUE Weight Bearing Per Provider Order: Non weight bearing      Mobility  Bed Mobility               General bed mobility comments: Not tested (pt up in recliner beginning/end of session)    Transfers Overall transfer level: Needs assistance Equipment used: None Transfers: Sit to/from Stand Sit to Stand: Supervision           General transfer comment: steady transfer from recliner    Ambulation/Gait Ambulation/Gait assistance: Contact guard assist Gait Distance (Feet): 120 Feet Assistive device: None Gait Pattern/deviations: Step-through pattern Gait velocity: mildly decreased     General Gait Details: pt overall steady but did c/o dizziness so pt was appearing more cautious during ambulation  Stairs Stairs: Yes Stairs assistance: Contact guard assist Stair Management: One rail Right, Step to pattern, Sideways Number of Stairs: 5 General stair comments: L UE on  hand rail; steady stairs navigation  Wheelchair Mobility     Tilt Bed    Modified Rankin (Stroke Patients Only)       Balance  Overall balance assessment: Needs assistance Sitting-balance support: No upper extremity supported, Feet supported Sitting balance-Leahy Scale: Good Sitting balance - Comments: steady reaching within BOS with L UE   Standing balance support: No upper extremity supported, During functional activity Standing balance-Leahy Scale: Good Standing balance comment: overall steady but did appear more cautious d/t c/o dizziness                             Pertinent Vitals/Pain Pain Assessment Pain Assessment: 0-10 Pain Score: 0-No pain Pain Location: R shoulder Pain Intervention(s): Limited activity within patient's tolerance, Monitored during session    Home Living Family/patient expects to be discharged to:: Private residence Living Arrangements: Spouse/significant other Available Help at Discharge: Family;Available 24 hours/day Type of Home: House Home Access: Stairs to enter Entrance Stairs-Rails: Right Entrance Stairs-Number of Steps: 3 STE R railing Alternate Level Stairs-Number of Steps: pt does not go upstairs Home Layout: Two level;Able to live on main level with bedroom/bathroom Home Equipment: Shower seat - built in;Cane - single point      Prior Function Prior Level of Function : Independent/Modified Independent;Driving             Mobility Comments: Independent with ambulation.  1 fall in March 2025 on vacation (which led up to need for TSA); 1 fall in Dec 2023/January 2024 on stairs carrying holiday decorations. ADLs Comments: Independent     Extremity/Trunk Assessment   Upper Extremity Assessment Upper Extremity Assessment: Right hand dominant;RUE deficits/detail RUE Deficits / Details: s/p reverse TSA    Lower Extremity Assessment Lower Extremity Assessment: Overall WFL for tasks assessed    Cervical / Trunk Assessment Cervical / Trunk Assessment: Normal  Communication   Communication Communication: No apparent difficulties    Cognition  Arousal: Alert Behavior During Therapy: WFL for tasks assessed/performed   PT - Cognitive impairments: No apparent impairments                         Following commands: Intact       Cueing Cueing Techniques: Verbal cues     General Comments General comments (skin integrity, edema, etc.): R UE sling/immobilizer in place.  Pt's wife and son present during session.    Exercises     Assessment/Plan    PT Assessment Patient needs continued PT services  PT Problem List Decreased strength;Decreased range of motion;Decreased mobility;Decreased knowledge of precautions;Impaired sensation;Decreased skin integrity;Pain       PT Treatment Interventions DME instruction;Gait training;Stair training;Functional mobility training;Therapeutic activities;Therapeutic exercise;Balance training;Patient/family education    PT Goals (Current goals can be found in the Care Plan section)  Acute Rehab PT Goals Patient Stated Goal: to improve dizziness and R UE function PT Goal Formulation: With patient Time For Goal Achievement: 09/08/23 Potential to Achieve Goals: Good    Frequency 7X/week     Co-evaluation               AM-PAC PT "6 Clicks" Mobility  Outcome Measure Help needed turning from your back to your side while in a flat bed without using bedrails?: None Help needed moving from lying on your back to sitting on the side of a flat bed without using bedrails?: A Little Help needed moving to and from a bed  to a chair (including a wheelchair)?: A Little Help needed standing up from a chair using your arms (e.g., wheelchair or bedside chair)?: A Little Help needed to walk in hospital room?: A Little Help needed climbing 3-5 steps with a railing? : A Little 6 Click Score: 19    End of Session Equipment Utilized During Treatment: Gait belt;Other (comment) (R UE sling/immobilizer) Activity Tolerance: Patient tolerated treatment well;Other (comment) (Pt c/o 5/10 dizzines  during ambulation) Patient left: in chair;with call bell/phone within reach;with nursing/sitter in room;with family/visitor present Nurse Communication: Mobility status;Precautions;Weight bearing status;Other (comment) (Pt's c/o dizziness and pt's BP during session; pt needing polar care reconnected) PT Visit Diagnosis: Other abnormalities of gait and mobility (R26.89);History of falling (Z91.81);Pain Pain - Right/Left: Right Pain - part of body: Shoulder    Time: 1610-9604 PT Time Calculation (min) (ACUTE ONLY): 18 min   Charges:   PT Evaluation $PT Eval Low Complexity: 1 Low   PT General Charges $$ ACUTE PT VISIT: 1 Visit        Amador Junes, PT 08/25/23, 5:39 PM

## 2023-08-25 NOTE — Evaluation (Addendum)
 Occupational Therapy Evaluation Patient Details Name: Donald Lee MRN: [id number] DOB: 1950-08-05 Today's Date: 08/25/2023   History of Present Illness   73 year old s/p R reverse TSA and biceps tenodesis.     Clinical Impressions Pt was seen for OT evaluation this date. Prior to hospital admission, pt was living in a 2 level home with his wife with 3 STE and 1 HR. He reports being IND at baseline without use of AD for all tasks, very active and independent until a fall leaving him with a shredded rotator cuff that was painful resulting in above diagnosis.   Pt has orders for RUE to be immobilized and will be NWBing per MD. He presents with impaired strength/ROM, pain, and sensation to RUE with block not completely resolved yet resulting in decreased ability to perform ADLs. Pt required Max/mod assist for UB dressing and max A for application of polar care, compression stockings, and sling/immobilizer. Pt, spouse and eldest son present for session and were instructed in polar care mgt, compression stockings mgt, sling/immobilizer mgt, ROM exercises for RUE (with instructions for no shoulder exercises until full sensation has returned), RUE precautions, adaptive strategies for ADL performance, positioning and considerations for sleep, and home/routines modifications to maximize falls prevention, safety, and independence. Handout provided to maximize carryover. OT adjusted sling/immobilizer and polar care to improve comfort, optimize positioning, and to maximize skin integrity/safety. Pt verbalized understanding of all education/training provided. STS from recliner performed with SUP and mild dizziness initially that he reported resolved. CGA for mobility to the bathroom and back, for standing toileting tasks and hand hygiene with no LOB. Handoff to PT performed. No further acute OT services needed and will sign off in house with pt to begin therapy in a few days as outpatient.    If plan is  discharge home, recommend the following:   A little help with walking and/or transfers;A lot of help with bathing/dressing/bathroom;Assistance with cooking/housework;Assist for transportation;Help with stairs or ramp for entrance     Functional Status Assessment   Patient has not had a recent decline in their functional status     Equipment Recommendations         Recommendations for Other Services         Precautions/Restrictions   Precautions Precautions: Shoulder;Fall Shoulder Interventions: Shoulder sling/immobilizer;Off for dressing/bathing/exercises;At all times Precaution Booklet Issued: Yes (comment) Recall of Precautions/Restrictions: Intact Required Braces or Orthoses: Sling Restrictions Weight Bearing Restrictions Per Provider Order: Yes RUE Weight Bearing Per Provider Order: Non weight bearing     Mobility Bed Mobility               General bed mobility comments: NT up in recliner pre/post session    Transfers Overall transfer level: Needs assistance   Transfers: Sit to/from Stand Sit to Stand: Supervision           General transfer comment: SUP for STS from recliner, CGA for mobility to the bathroom and back without AD      Balance Overall balance assessment: Needs assistance   Sitting balance-Leahy Scale: Good       Standing balance-Leahy Scale: Fair Standing balance comment: CGA without AD use                           ADL either performed or assessed with clinical judgement   ADL Overall ADL's : Needs assistance/impaired     Grooming: Wash/dry hands;Contact guard assist;Supervision/safety;Standing  Upper Body Dressing : Maximal assistance;Moderate assistance;Sitting       Toilet Transfer: Pharmacist, community Details (indicate cue type and reason): standing to urinate Toileting- Clothing Manipulation and Hygiene: Contact guard assist;Sit to/from  stand Toileting - Clothing Manipulation Details (indicate cue type and reason): to manage pants to urinate     Functional mobility during ADLs: Contact guard assist       Vision         Perception         Praxis         Pertinent Vitals/Pain       Extremity/Trunk Assessment Upper Extremity Assessment Upper Extremity Assessment: Right hand dominant;RUE deficits/detail RUE Deficits / Details: s/p reverse TSA   Lower Extremity Assessment Lower Extremity Assessment: Overall WFL for tasks assessed       Communication Communication Communication: No apparent difficulties   Cognition Arousal: Alert Behavior During Therapy: WFL for tasks assessed/performed Cognition: No apparent impairments                               Following commands: Intact       Cueing  General Comments   Cueing Techniques: Verbal cues  mild dizziness with initial stand that pt reported subsided, however admitted to 5/10 dizziness with PT during mobility   Exercises Other Exercises Other Exercises: Edu on role of OT in acute setting, polar care use, don/doffing shoulder immobilizer sling, RUE restrictions, use of AE/AD/DME to maximize safety with ADLs   Shoulder Instructions      Home Living Family/patient expects to be discharged to:: Private residence Living Arrangements: Spouse/significant other Available Help at Discharge: Family;Available 24 hours/day Type of Home: House Home Access: Stairs to enter [employer] of Steps: 3 STE- r hand rail going up; front door 5 STE with bil HR-they go through the back Entrance Stairs-Rails: Right Home Layout: Two level;Able to live on main level with bedroom/bathroom Alternate Level Stairs-Number of Steps: pt does not go upstairs   Bathroom Shower/Tub: Producer, television/film/video: Handicapped height     Home Equipment: Shower seat - built in;Cane - single point          Prior Functioning/Environment Prior  Level of Function : Independent/Modified Independent;Driving             Mobility Comments: IND without and use; 1 fall in the last 6 months which led to shredded rotator cuff and need for  TSA ADLs Comments: IND    OT Problem List: Decreased strength;Decreased range of motion   OT Treatment/Interventions:        OT Goals(Current goals can be found in the care plan section)       OT Frequency:       Co-evaluation              AM-PAC OT "6 Clicks" Daily Activity     Outcome Measure Help from another person eating meals?: None Help from another person taking care of personal grooming?: A Little Help from another person toileting, which includes using toliet, bedpan, or urinal?: A Little Help from another person bathing (including washing, rinsing, drying)?: A Lot Help from another person to put on and taking off regular upper body clothing?: A Lot Help from another person to put on and taking off regular lower body clothing?: A Lot 6 Click Score: 16   End of Session    Activity Tolerance: Patient  tolerated treatment well Patient left:  (handoff to PT)  OT Visit Diagnosis: Other abnormalities of gait and mobility (R26.89)                Time: 1400-1436 OT Time Calculation (min): 36 min Charges:  OT General Charges $OT Visit: 1 Visit OT Evaluation $OT Eval Moderate Complexity: 1 Mod OT Treatments $Self Care/Home Management : 8-22 mins Nachelle Negrette, OTR/L 08/25/23, 5:14 PM  Tuere Nwosu E Taneya Conkel 08/25/2023, 5:11 PM

## 2023-08-25 NOTE — Anesthesia Procedure Notes (Signed)
 Anesthesia Regional Block: Interscalene brachial plexus block   Pre-Anesthetic Checklist: , timeout performed,  Correct Patient, Correct Site, Correct Laterality,  Correct Procedure, Correct Position, site marked,  Risks and benefits discussed,  Surgical consent,  Pre-op evaluation,  At surgeon's request and post-op pain management  Laterality: Right  Prep: alcohol swabs       Needles:  Injection technique: Single-shot  Needle Type: Echogenic Needle     Needle Length: 4cm  Needle Gauge: 25     Additional Needles:   Procedures:,,,, ultrasound used (permanent image in chart),,    Narrative:  Start time: 08/25/2023 11:18 AM End time: 08/25/2023 11:20 AM Injection made incrementally with aspirations every 5 mL.  Performed by: Personally  Anesthesiologist: Nancey Awkward, MD  Additional Notes: Patient's chart reviewed and they were deemed appropriate candidate for procedure, at surgeon's request. Patient educated about risks, benefits, and alternatives of the block including but not limited to: temporary or permanent nerve damage, bleeding, infection, damage to surround tissues, pneumothorax, hemidiaphragmatic paralysis, unilateral Horner's syndrome, block failure, local anesthetic toxicity. Patient expressed understanding. A formal time-out was conducted consistent with institution rules.  Monitors were applied, and minimal sedation used (see nursing record). The site was prepped with skin prep and allowed to dry, and sterile gloves were used. A high frequency linear ultrasound probe with probe cover was utilized throughout. C5-7 nerve roots located and appeared anatomically normal, local anesthetic injected around them, and echogenic block needle trajectory was monitored throughout. Aspiration performed every 5ml. Lung and blood vessels were avoided. All injections were performed without resistance and free of blood and paresthesias. The patient tolerated the procedure  well.  Injectate: 20ml exparel 

## 2023-08-26 ENCOUNTER — Encounter: Payer: Self-pay | Admitting: Orthopedic Surgery

## 2023-08-26 NOTE — Anesthesia Postprocedure Evaluation (Signed)
 Anesthesia Post Note  Patient: Rafik Gilley Lindholm  Procedure(s) Performed: ARTHROPLASTY, SHOULDER, TOTAL, REVERSE (Right: Shoulder)  Patient location during evaluation: PACU Anesthesia Type: General and Regional Level of consciousness: awake and alert Pain management: pain level controlled Vital Signs Assessment: post-procedure vital signs reviewed and stable Respiratory status: spontaneous breathing, nonlabored ventilation, respiratory function stable and patient connected to nasal cannula oxygen Cardiovascular status: blood pressure returned to baseline and stable Postop Assessment: no apparent nausea or vomiting Anesthetic complications: no   No notable events documented.   Last Vitals:  Vitals:   08/25/23 1554 08/25/23 1744  BP: 132/74 (!) 140/78  Pulse: 68 65  Resp: 17 17  Temp: 36.9 C 36.9 C  SpO2: 95% 98%    Last Pain:  Vitals:   08/25/23 1744  TempSrc: Temporal  PainSc:                  Nancey Awkward

## 2023-09-25 NOTE — Progress Notes (Signed)
 [facility] [address] [place], [place] [address]  Pulmonary Sleep Medicine   Office Visit Note  Patient Name: Donald Lee DOB: [birth year] MRN [id number]    Chief Complaint: Obstructive Sleep Apnea visit  Brief History:  Donald Lee is seen today for an annual follow up visit for APAP@ 8-18 cmH2O. The patient has a 3.5 year history of sleep apnea. Patient is using PAP nightly.  The patient feels rested after sleeping with PAP.  The patient reports benefiting from PAP use. Reported sleepiness is  improved and the Epworth Sleepiness Score is 10 out of 24. The patient does not take naps. The patient complains of the following: none.  The compliance download shows 92% compliance with an average use time of 8 hours 42 minutes. The AHI is 5.5.  The patient does not complain of limb movements disrupting sleep. The patient continues to require PAP therapy in order to eliminate sleep apnea.   ROS  General: (-) fever, (-) chills, (-) night sweat Nose and Sinuses: (-) nasal stuffiness or itchiness, (-) postnasal drip, (-) nosebleeds, (-) sinus trouble. Mouth and Throat: (-) sore throat, (-) hoarseness. Neck: (-) swollen glands, (-) enlarged thyroid, (-) neck pain. Respiratory: - cough, - shortness of breath, - wheezing. Neurologic: - numbness, - tingling. Psychiatric: - anxiety, - depression   Current Medication: Outpatient Encounter Medications as of 09/28/2023  Medication Sig   acetaminophen  (TYLENOL ) 500 MG tablet Take 2 tablets (1,000 mg total) by mouth every 8 (eight) hours.   albuterol (VENTOLIN HFA) 108 (90 Base) MCG/ACT inhaler Inhale 2 puffs into the lungs every 6 (six) hours as needed for wheezing or shortness of breath.   alfuzosin (UROXATRAL) 10 MG 24 hr tablet Take 10 mg by mouth at bedtime.   aspirin  EC 325 MG tablet Take 1 tablet (325 mg total) by mouth daily.   cetirizine (ZYRTEC) 10 MG tablet Take 10 mg by mouth at bedtime.   Cholecalciferol (VITAMIN D3) 125  MCG (5000 UT) TABS Take 5,000 Units by mouth daily.   cyanocobalamin (VITAMIN B12) 1000 MCG tablet Take 1,000 mcg by mouth daily.   diphenoxylate-atropine (LOMOTIL) 2.5-0.025 MG tablet Take 1 tablet by mouth 4 (four) times daily as needed.   Ferrous Sulfate (IRON) 325 (65 Fe) MG TABS Take 65 mg by mouth daily.   finasteride (PROSCAR) 5 MG tablet Take 5 mg by mouth daily.   levothyroxine  (SYNTHROID ) 112 MCG tablet Take 224 mcg by mouth daily before breakfast.   Omega-3 Fatty Acids (FISH OIL) 1000 MG CAPS Take 1,000 mg by mouth daily.   ondansetron  (ZOFRAN -ODT) 4 MG disintegrating tablet Take 1 tablet (4 mg total) by mouth every 8 (eight) hours as needed for nausea or vomiting.   oxyCODONE  (ROXICODONE ) 5 MG immediate release tablet Take 1-2 tablets (5-10 mg total) by mouth every 4 (four) hours as needed (pain).   pantoprazole  (PROTONIX ) 40 MG tablet Take 40 mg by mouth daily.   pravastatin (PRAVACHOL) 10 MG tablet Take 1 tablet by mouth at bedtime.   sertraline (ZOLOFT) 100 MG tablet Take 100 mg by mouth daily.   sildenafil (VIAGRA) 100 MG tablet Take 100 mg by mouth as needed for erectile dysfunction.   solifenacin (VESICARE) 10 MG tablet Take 10 mg by mouth daily.   ZEPBOUND 2.5 MG/0.5ML Pen Inject 2.5 mg into the skin once a week.   No facility-administered encounter medications on file as of 09/28/2023.    Surgical History: Past Surgical History:  Procedure Laterality Date  APPENDECTOMY     BACK SURGERY     BIOPSY  05/12/2023   Procedure: BIOPSY;  Surgeon: Corky Diener, Alphonsus Jeans, MD;  Location: [facility] END[facility]COPY;  Service: Gastroenterology;;   BOWEL RESECTION N/A 05/20/2023   Procedure: SMALL BOWEL RESECTION;  Surgeon: Eldred Grego, MD;  Location: [facility] ORS;  Service: General;  Laterality: N/A;   COLONOSCOPY WITH PROPOFOL  N/A 05/12/2023   Procedure: COLONOSCOPY WITH PROPOFOL ;  Surgeon: Toledo, Alphonsus Jeans, MD;  Location: [facility] ENDOSCOPY;  Service: Gastroenterology;  Laterality: N/A;    ESOPHAGOGASTRODUODENOSCOPY (EGD) WITH PROPOFOL  N/A 05/12/2023   Procedure: ESOPHAGOGASTRODUODENOSCOPY (EGD) WITH PROPOFOL ;  Surgeon: Toledo, Alphonsus Jeans, MD;  Location: [facility] ENDOSCOPY;  Service: Gastroenterology;  Laterality: N/A;   FOOT SURGERY Bilateral    bone spurs   HERNIA REPAIR Right    groin   REVERSE SHOULDER ARTHROPLASTY Right 08/25/2023   Procedure: ARTHROPLASTY, SHOULDER, TOTAL, REVERSE;  Surgeon: Lorri Rota, MD;  Location: [facility] ORS;  Service: Orthopedics;  Laterality: Right;  Right reverse shoulder arthroplasty, biceps tenodesis   SHOULDER ARTHROSCOPY WITH SUBACROMIAL DECOMPRESSION AND OPEN ROTATOR C Left 08/13/2021   Procedure: Left shoulder arthroscopic biceps tenodesis with subacromial decompression and mini open rotator cuff repair;  Surgeon: Lorri Rota, MD;  Location: [facility];  Service: Orthopedics;  Laterality: Left;   THYROIDECTOMY, PARTIAL     VASECTOMY     XI ROBOT ASSISTED DIAGNOSTIC LAPAROSCOPY N/A 05/20/2023   Procedure: XI ROBOT ASSISTED DIAGNOSTIC LAPAROSCOPY;  Surgeon: Eldred Grego, MD;  Location: [facility] ORS;  Service: General;  Laterality: N/A;    Medical History: Past Medical History:  Diagnosis Date   Benign localized hyperplasia of prostate with urinary obstruction    Depressive disorder    Dyslipidemia    Hyperlipidemia    Hypertension    Hypogammaglobulinemia (HCC)    Hypothyroidism    OSA on CPAP    Primary osteoarthritis of left foot    Spinal stenosis of lumbar region with neurogenic claudication    Ulcer of the stomach and intestine     Family History: Non contributory to the present illness  Social History: Social History   Socioeconomic History   Marital status: Married    Spouse name: Not on file   Number of children: Not on file   Years of education: Not on file   Highest education level: Not on file  Occupational History   Not on file  Tobacco Use   Smoking status: Light Smoker    Types: Cigars   Smokeless  tobacco: Never   Tobacco comments:    2 cigars a day  Vaping Use   Vaping status: Never Used  Substance and Sexual Activity   Alcohol use: Not Currently    Comment: rare   Drug use: Never   Sexual activity: Not on file  Other Topics Concern   Not on file  Social History Narrative   Not on file   Social Drivers of Health   Financial Resource Strain: Low Risk  (06/02/2023)   Received from [facility]   Overall Financial Resource Strain (CARDIA)    Difficulty of Paying Living Expenses: Not hard at all  Food Insecurity: No Food Insecurity (08/31/2023)   Received from [facility]   Hunger Vital Sign    Within the past 12 months, you worried that your food would run out before you got the money to buy more.: Never true    Within the past 12 months, the food you bought just didn't last and  you didn't have money to get more.: Never true  Transportation Needs: No Transportation Needs (08/31/2023)   Received from [facility] - Transportation    In the past 12 months, has lack of transportation kept you from medical appointments or from getting medications?: No    Lack of Transportation (Non-Medical): No  Physical Activity: Insufficiently Active (06/08/2017)   Received from [facility]   Exercise Vital Sign    Days of Exercise per Week: 3 days    Minutes of Exercise per Session: 30 min  Stress: No Stress Concern Present (06/08/2017)   Received from [facility] - Occupational Stress Questionnaire    Feeling of Stress : Not at all  Social Connections: Socially Integrated (05/19/2023)   Social Connection and Isolation Panel    Frequency of Communication with Friends and Family: More than three times a week    Frequency of Social Gatherings with Friends and Family: More than three times a week    Attends Religious Services: More than 4 times per year     Active Member of [employer] or Organizations: Yes    Attends Engineer, structural: More than 4 times per year    Marital Status: Married  Catering manager Violence: Not At Risk (05/19/2023)   Humiliation, Afraid, Rape, and Kick questionnaire    Fear of Current or Ex-Partner: No    Emotionally Abused: No    Physically Abused: No    Sexually Abused: No    Vital Signs: Blood pressure (!) 144/76, pulse 66, resp. rate 16, height 5' 11 (1.803 m), weight 204 lb (92.5 kg), SpO2 99%. Body mass index is 28.45 kg/m.    Examination: General Appearance: The patient is well-developed, well-nourished, and in no distress. Neck Circumference: 46 cm Skin: Gross inspection of skin unremarkable. Head: normocephalic, no gross deformities. Eyes: no gross deformities noted. ENT: ears appear grossly normal Neurologic: Alert and oriented. No involuntary movements.  STOP BANG RISK ASSESSMENT S (snore) Have you been told that you snore?     NO   T (tired) Are you often tired, fatigued, or sleepy during the day?   NO  O (obstruction) Do you stop breathing, choke, or gasp during sleep? NO   P (pressure) Do you have or are you being treated for high blood pressure? YES   B (BMI) Is your body index greater than 35 kg/m? NO   A (age) Are you 61 years old or older? YES   N (neck) Do you have a neck circumference greater than 16 inches?   YES   G (gender) Are you a male? YES   TOTAL STOP/BANG "YES" ANSWERS 5       A STOP-Bang score of 2 or less is considered low risk, and a score of 5 or more is high risk for having either moderate or severe OSA. For people who score 3 or 4, doctors may need to perform further assessment to determine how likely they are to have OSA.         EPWORTH SLEEPINESS SCALE:  Scale:  (0)= no chance of dozing; (1)= slight chance of dozing; (2)= moderate chance of dozing; (3)= high chance of dozing  Chance  Situtation    Sitting and reading: 1    Watching TV: 1     Sitting Inactive in public: 2    As a passenger in car: 1  Lying down to rest: 1    Sitting and talking: 1    Sitting quielty after lunch: 2    In a car, stopped in traffic: 1   TOTAL SCORE:   10 out of 24    SLEEP STUDIES:  PSG (09/2018) AHI 58/hr, Supine AHI 83.4/hr, REM AHI 84.8/hr, min SpO2 68% Titration (09/2018) APAP@ 8-15 cmH2O   CPAP COMPLIANCE DATA:  Date Range: 09/24/2022-09/23/2023  Average Daily Use: 8 hours 42 minutes  Median Use: 8 hours 57 minutes  Compliance for > 4 Hours: 92%  AHI: 5.5 respiratory events per hour  Days Used: 346/365 days  Mask Leak: 20.6  95th Percentile Pressure: 15.4         LABS: Recent Results (from the past 2160 hours)  CBC WITH DIFFERENTIAL     Status: Abnormal   Collection Time: 08/14/23  1:46 PM  Result Value Ref Range   WBC 5.2 4.0 - 10.5 K/uL   RBC 3.66 (L) 4.22 - 5.81 MIL/uL   Hemoglobin 12.1 (L) 13.0 - 17.0 g/dL   HCT 69.6 (L) 29.5 - 28.4 %   MCV 96.2 80.0 - 100.0 fL   MCH 33.1 26.0 - 34.0 pg   MCHC 34.4 30.0 - 36.0 g/dL   RDW 13.2 44.0 - 10.2 %   Platelets 213 150 - 400 K/uL   nRBC 0.0 0.0 - 0.2 %   Neutrophils Relative % 60 %   Neutro Abs 3.1 1.7 - 7.7 K/uL   Lymphocytes Relative 28 %   Lymphs Abs 1.5 0.7 - 4.0 K/uL   Monocytes Relative 8 %   Monocytes Absolute 0.4 0.1 - 1.0 K/uL   Eosinophils Relative 3 %   Eosinophils Absolute 0.2 0.0 - 0.5 K/uL   Basophils Relative 1 %   Basophils Absolute 0.1 0.0 - 0.1 K/uL   Immature Granulocytes 0 %   Abs Immature Granulocytes 0.01 0.00 - 0.07 K/uL    Comment: Performed at [facility], [address] [place], [place] [address]  Comprehensive metabolic panel     Status: Abnormal   Collection Time: 08/14/23  1:46 PM  Result Value Ref Range   Sodium 135 135 - 145 mmol/L   Potassium 3.7 3.5 - 5.1 mmol/L   Chloride 102 98 - 111 mmol/L   CO2 25 22 - 32 mmol/L   Glucose, Bld 96 70 - 99 mg/dL    Comment: Glucose reference range applies  only to samples taken after fasting for at least 8 hours.   BUN 32 (H) 8 - 23 mg/dL   Creatinine, Ser 6.44 0.61 - 1.24 mg/dL   Calcium 8.9 8.9 - 03.4 mg/dL   Total Protein 6.4 (L) 6.5 - 8.1 g/dL   Albumin 4.0 3.5 - 5.0 g/dL   AST 16 15 - 41 U/L   ALT 17 0 - 44 U/L   Alkaline Phosphatase 42 38 - 126 U/L   Total Bilirubin 0.6 0.0 - 1.2 mg/dL   GFR, Estimated >74 >25 mL/min    Comment: (NOTE) Calculated using the CKD-EPI Creatinine Equation (2021)    Anion gap 8 5 - 15    Comment: Performed at [facility], [address] [place], [place] [address]  Urinalysis, Routine w reflex microscopic -Urine, Clean Catch     Status: Abnormal   Collection Time: 08/14/23  1:46 PM  Result Value Ref Range   Color, Urine YELLOW (A) YELLOW   APPearance CLEAR (A) CLEAR   Specific Gravity, Urine 1.017 1.005 -  1.030   pH 5.0 5.0 - 8.0   Glucose, UA NEGATIVE NEGATIVE mg/dL   Hgb urine dipstick NEGATIVE NEGATIVE   Bilirubin Urine NEGATIVE NEGATIVE   Ketones, ur NEGATIVE NEGATIVE mg/dL   Protein, ur NEGATIVE NEGATIVE mg/dL   Nitrite NEGATIVE NEGATIVE   Leukocytes,Ua NEGATIVE NEGATIVE    Comment: Performed at [facility], [address] [place], [place] [address]  Type and screen Order type and screen if day of surgery is less than 15 days from draw of preadmission visit or order morning of surgery if day of surgery is greater than 6 days from preadmission visit.     Status: None   Collection Time: 08/14/23  1:46 PM  Result Value Ref Range   ABO/RH(D) A POS    Antibody Screen NEG    Sample Expiration 08/28/2023,2359    Extend sample reason      NO TRANSFUSIONS OR PREGNANCY IN THE PAST 3 MONTHS Performed at [facility], [address] [place], [place] [address]   Surgical pcr screen     Status: Abnormal   Collection Time: 08/14/23  1:46 PM   Specimen: Nasal Mucosa; Nasal Swab  Result Value Ref Range   MRSA, PCR NEGATIVE NEGATIVE   Staphylococcus aureus POSITIVE (A)  NEGATIVE    Comment: (NOTE) The Xpert SA Assay (FDA approved for NASAL specimens in patients 60 years of age and older), is one component of a comprehensive surveillance program. It is not intended to diagnose infection nor to guide or monitor treatment. Performed at [facility], [address], [place], [place] [address]   ABO/Rh     Status: None   Collection Time: 08/25/23  9:40 AM  Result Value Ref Range   ABO/RH(D)      A POS Performed at [facility], [address] [place], [place] [address]     Radiology: DG Shoulder 1V Right Result Date: 08/25/2023 CLINICAL DATA:  Status post reverse total shoulder arthroplasty. EXAM: RIGHT SHOULDER - 1 VIEW COMPARISON:  CT right shoulder 08/06/2023 FINDINGS: Interval reverse total right shoulder arthroplasty. On limited single frontal image, no perihardware lucency is seen to indicate hardware failure or loosening. 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 No acute fracture or dislocation. IMPRESSION: Interval reverse total right shoulder arthroplasty without evidence of hardware failure. Electronically Signed   By: Bertina Broccoli M.D.   On: 08/25/2023 16:15   US  OR NERVE BLOCK-IMAGE ONLY [facility]) Result Date: 08/25/2023 There is no interpretation for this exam.  This order is for images obtained during a surgical procedure.  Please See Surgeries Tab for more information regarding the procedure.    No results found.  No results found.    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   Hypogammaglobulinemia (HCC) 06/21/2020   Obesity (BMI 30-39.9) 04/24/2020   OSA on CPAP 04/17/2020   CPAP use counseling 04/17/2020   Chronic right-sided low back pain with right-sided sciatica 01/05/2020   Foraminal stenosis of cervical region 08/15/2019   Restless leg syndrome 05/11/2019   Depressive  disorder 05/19/2018   Dyslipidemia 05/19/2018   Hypothyroidism 05/19/2018   Neck pain 05/19/2018   Class 1 obesity with serious comorbidity and body mass index (BMI) of 34.0 to 34.9 in adult 06/08/2017   Spinal stenosis of lumbar region with neurogenic claudication 09/24/2016   Incomplete emptying of bladder 02/17/2016   Primary  osteoarthritis of left foot 10/11/2015   Recurrent major depressive disorder, in remission (HCC) 10/11/2015   Hyperlipidemia 03/24/2014   Hypertension 03/24/2014   Postoperative hypothyroidism 03/24/2014   Benign localized hyperplasia of prostate with urinary obstruction 12/01/2012   Nocturia 12/01/2012   Slowing of urinary stream 12/01/2012   1. OSA on CPAP (Primary) The patient does tolerate PAP and reports  benefit from PAP use. His apnea is not controlled of late but this is from mask leak and sleeping supine due to an injury. We will have a mask fitting and do a download a few weeks after. He was advised to use a wedge pillow to elevate head and shoulders. The patient was reminded how to clean equipment and advised to replace supplies routinely. The patient was also counselled on weight loss- he has been on zepbound and has lost 50 lbs this past year. . The compliance is excellent. The AHI is 5.5.   OSA on cpap-  not optimally controlled due to mask leak and supine sleep. He will raise head and shoulders and have mask fitting. Download after mask fit.  Continue with excellent compliance with pap. CPAP continues to be medically necessary to treat this patient's OSA. F/u one year.    2. CPAP use counseling CPAP Counseling: had a lengthy discussion with the patient regarding the importance of PAP therapy in management of the sleep apnea. Patient appears to understand the risk factor reduction and also understands the risks associated with untreated sleep apnea. Patient will try to make a good faith effort to remain compliant with therapy. Also instructed the patient  on proper cleaning of the device including the water  must be changed daily if possible and use of distilled water  is preferred. Patient understands that the machine should be regularly cleaned with appropriate recommended cleaning solutions that do not damage the PAP machine for example given white vinegar and water  rinses. Other methods such as ozone treatment may not be as good as these simple methods to achieve cleaning.   3. Hypertension, unspecified type Does not require medication. BP 144/76 today.      General Counseling: I have discussed the findings of the evaluation and examination with Royston Cornea.  I have also discussed any further diagnostic evaluation thatmay be needed or ordered today. Gearld verbalizes understanding of the findings of todays visit. We also reviewed his medications today and discussed drug interactions and side effects including but not limited excessive drowsiness and altered mental states. We also discussed that there is always a risk not just to him but also people around him. he has been encouraged to call the office with any questions or concerns that should arise related to todays visit.  No orders of the defined types were placed in this encounter.       I have personally obtained a history, examined the patient, evaluated laboratory and imaging results, formulated the assessment and plan and placed orders. This patient was seen today by Louann Rous, PA-C in collaboration with Dr. Cam Cava.   Cordie Deters, MD [facility] Diplomate ABMS Pulmonary Critical Care Medicine and Sleep Medicine

## 2023-09-28 ENCOUNTER — Ambulatory Visit (INDEPENDENT_AMBULATORY_CARE_PROVIDER_SITE_OTHER): Admitting: Internal Medicine

## 2023-09-28 VITALS — BP 144/76 | HR 66 | Resp 16 | Ht 71.0 in | Wt 204.0 lb

## 2023-09-28 DIAGNOSIS — G4733 Obstructive sleep apnea (adult) (pediatric): Secondary | ICD-10-CM | POA: Diagnosis not present

## 2023-09-28 DIAGNOSIS — I10 Essential (primary) hypertension: Secondary | ICD-10-CM | POA: Diagnosis not present

## 2023-09-28 DIAGNOSIS — Z7189 Other specified counseling: Secondary | ICD-10-CM | POA: Diagnosis not present

## 2023-09-28 NOTE — Patient Instructions (Signed)
